# Patient Record
Sex: Male | Born: 1940 | Race: White | Hispanic: No | Marital: Married | Smoking: Never smoker
Health system: Southern US, Community
[De-identification: ages and names within clinical notes are randomized; demographics above are authoritative.]

## PROBLEM LIST (undated history)

## (undated) DIAGNOSIS — I63532 Cerebral infarction due to unspecified occlusion or stenosis of left posterior cerebral artery: Secondary | ICD-10-CM

## (undated) DIAGNOSIS — E785 Hyperlipidemia, unspecified: Secondary | ICD-10-CM

## (undated) DIAGNOSIS — Z86010 Personal history of colonic polyps: Secondary | ICD-10-CM

## (undated) DIAGNOSIS — I10 Essential (primary) hypertension: Secondary | ICD-10-CM

## (undated) DIAGNOSIS — R7302 Impaired glucose tolerance (oral): Secondary | ICD-10-CM

## (undated) DIAGNOSIS — D649 Anemia, unspecified: Secondary | ICD-10-CM

## (undated) DIAGNOSIS — H9319 Tinnitus, unspecified ear: Secondary | ICD-10-CM

## (undated) DIAGNOSIS — N400 Enlarged prostate without lower urinary tract symptoms: Secondary | ICD-10-CM

## (undated) DIAGNOSIS — R569 Unspecified convulsions: Secondary | ICD-10-CM

## (undated) DIAGNOSIS — K439 Ventral hernia without obstruction or gangrene: Secondary | ICD-10-CM

## (undated) DIAGNOSIS — A530 Latent syphilis, unspecified as early or late: Secondary | ICD-10-CM

## (undated) HISTORY — DX: Tinnitus, unspecified ear: H93.19

## (undated) HISTORY — DX: Latent syphilis, unspecified as early or late: A53.0

## (undated) HISTORY — DX: Personal history of colonic polyps: Z86.010

## (undated) HISTORY — DX: Hyperlipidemia, unspecified: E78.5

## (undated) HISTORY — DX: Impaired glucose tolerance (oral): R73.02

## (undated) HISTORY — DX: Ventral hernia without obstruction or gangrene: K43.9

## (undated) HISTORY — PX: OTHER SURGICAL HISTORY: SHX169

## (undated) HISTORY — DX: Anemia, unspecified: D64.9

## (undated) HISTORY — DX: Benign prostatic hyperplasia without lower urinary tract symptoms: N40.0

---

## 2008-09-08 ENCOUNTER — Emergency Department (HOSPITAL_COMMUNITY): Admission: EM | Admit: 2008-09-08 | Discharge: 2008-09-08 | Payer: Self-pay | Admitting: Emergency Medicine

## 2010-08-25 ENCOUNTER — Emergency Department (HOSPITAL_COMMUNITY)
Admission: EM | Admit: 2010-08-25 | Discharge: 2010-08-26 | Payer: Self-pay | Source: Home / Self Care | Admitting: Emergency Medicine

## 2010-11-24 LAB — DIFFERENTIAL
Basophils Absolute: 0 10*3/uL (ref 0.0–0.1)
Basophils Relative: 0 % (ref 0–1)
Eosinophils Absolute: 0 K/uL (ref 0.0–0.7)
Eosinophils Relative: 0 % (ref 0–5)
Lymphocytes Relative: 6 % — ABNORMAL LOW (ref 12–46)
Lymphs Abs: 0.5 K/uL — ABNORMAL LOW (ref 0.7–4.0)
Monocytes Absolute: 0.3 10*3/uL (ref 0.1–1.0)
Monocytes Relative: 4 % (ref 3–12)
Neutro Abs: 7.9 K/uL — ABNORMAL HIGH (ref 1.7–7.7)
Neutrophils Relative %: 90 % — ABNORMAL HIGH (ref 43–77)

## 2010-11-24 LAB — COMPREHENSIVE METABOLIC PANEL WITH GFR
ALT: 15 U/L (ref 0–53)
AST: 18 U/L (ref 0–37)
Albumin: 3.9 g/dL (ref 3.5–5.2)
Alkaline Phosphatase: 82 U/L (ref 39–117)
BUN: 16 mg/dL (ref 6–23)
GFR calc Af Amer: >60 mL/min (ref >60)
Potassium: 3.7 meq/L (ref 3.5–5.1)
Sodium: 138 meq/L (ref 135–145)
Total Protein: 7.3 g/dL (ref 6.0–8.3)

## 2010-11-24 LAB — CBC
HCT: 42.1 % (ref 39.0–52.0)
Hemoglobin: 13.6 g/dL (ref 13.0–17.0)
MCHC: 32.3 g/dL (ref 30.0–36.0)
MCV: 75.2 fL — ABNORMAL LOW (ref 78.0–100.0)
Platelets: 203 K/uL (ref 150–400)
RBC: 5.59 MIL/uL (ref 4.22–5.81)
RDW: 16.1 % — ABNORMAL HIGH (ref 11.5–15.5)
WBC: 8.7 10*3/uL (ref 4.0–10.5)

## 2010-11-24 LAB — COMPREHENSIVE METABOLIC PANEL
CO2: 23 mEq/L (ref 19–32)
Calcium: 9.2 mg/dL (ref 8.4–10.5)
Chloride: 107 mEq/L (ref 96–112)
Creatinine, Ser: 1.1 mg/dL (ref 0.4–1.5)
GFR calc non Af Amer: >60 mL/min (ref >60)
Glucose, Bld: 158 mg/dL — ABNORMAL HIGH (ref 70–99)
Total Bilirubin: 1 mg/dL (ref 0.3–1.2)

## 2010-11-24 LAB — URINALYSIS, ROUTINE W REFLEX MICROSCOPIC
Bilirubin Urine: NEGATIVE
Glucose, UA: NEGATIVE mg/dL
Hgb urine dipstick: NEGATIVE
Ketones, ur: NEGATIVE mg/dL
Nitrite: NEGATIVE
Protein, ur: NEGATIVE mg/dL
Specific Gravity, Urine: 1.023 (ref 1.005–1.030)
Urobilinogen, UA: 0.2 mg/dL (ref 0.0–1.0)
pH: 5 (ref 5.0–8.0)

## 2010-11-24 LAB — LIPASE, BLOOD: Lipase: 17 U/L (ref 11–59)

## 2011-06-30 ENCOUNTER — Encounter: Payer: Self-pay | Admitting: *Deleted

## 2011-06-30 ENCOUNTER — Emergency Department (INDEPENDENT_AMBULATORY_CARE_PROVIDER_SITE_OTHER)
Admission: EM | Admit: 2011-06-30 | Discharge: 2011-06-30 | Disposition: A | Discharge status: Home / Self Care | Payer: Medicare Other | Source: Home / Self Care | Attending: Emergency Medicine | Admitting: Emergency Medicine

## 2011-06-30 DIAGNOSIS — G5600 Carpal tunnel syndrome, unspecified upper limb: Secondary | ICD-10-CM

## 2011-06-30 DIAGNOSIS — G5601 Carpal tunnel syndrome, right upper limb: Secondary | ICD-10-CM

## 2011-06-30 HISTORY — DX: Essential (primary) hypertension: I10

## 2011-06-30 MED ORDER — TRAMADOL HCL 50 MG PO TABS: ORAL_TABLET | ORAL | Status: AC

## 2011-06-30 NOTE — ED Notes (Signed)
Right hand pain for  several years.  He tool tramadol before and got relief with that.  Denies  injury

## 2011-06-30 NOTE — ED Provider Notes (Signed)
History     CSN: [id number] Arrival date & time: 06/30/2011  2:37 PM   First MD Initiated Contact with Patient 06/30/11 1312      Chief Complaint  Patient presents with  . Hand Pain   HPI Comments: righthanded male who reports several years of intermittent right hand "timgling' lasting several minutes and resolving. Tends to wake him up at night when he has his wrist flexed, and with use. No weakness, h/o recent, remote trauma, color changes, fevers, deformity. Taking ultram prn for this rx'd to him by PMD Dr. Erling Conte with relief. States has run out of tramadol.   Patient is a 70 y.o. male presenting with hand pain. The history is provided by the patient. No language interpreter was used.  Hand Pain This is a chronic problem. The problem has been gradually worsening. Exacerbated by: flexgin wrist, use of hand. Relieved by: tramadiol.    Past Medical History  Diagnosis Date  . Hypertension     History reviewed. No pertinent past surgical history.  Family History  Problem Relation Age of Onset  . Cancer Mother   . COPD Father     History  Substance Use Topics  . Smoking status: Never Smoker   . Smokeless tobacco: Never Used  . Alcohol Use: No      Review of Systems  Constitutional: Negative for fever.  Gastrointestinal: Negative for nausea and vomiting.  Musculoskeletal: Negative for joint swelling.  Skin: Negative for color change, pallor and rash.  Neurological: Negative for weakness and numbness.    Allergies  Review of patient's allergies indicates no known allergies.  Home Medications   Current Outpatient Rx  Name Route Sig Dispense Refill  . ASPIRIN 81 MG PO TABS Oral Take 81 mg by mouth daily.      . TRAMADOL HCL 50 MG PO TABS  1-2 tabs po q 6 hr prn pain Maximum dose= 8 tablets per day 20 tablet 0    BP 165/86  Pulse 56  Temp(Src) 98 F (36.7 C) (Oral)  Resp 16  SpO2 100%  Physical Exam  Nursing note and vitals reviewed. Constitutional: He is  oriented to person, place, and time. He appears well-developed and well-nourished.  HENT:  Head: Normocephalic and atraumatic.  Eyes: Conjunctivae and EOM are normal. Pupils are equal, round, and reactive to light.  Neck: Normal range of motion.  Cardiovascular: Regular rhythm.   Pulmonary/Chest: Effort normal. No respiratory distress.  Abdominal: He exhibits no distension.  Musculoskeletal: Normal range of motion.       Baseline Strength and Sensation to R hand with normal light touch intact for Pt, distal motor and sensation in median/radial/ulnar nerve distribution with CR< 2 secs and pulse intact.  Hand with intact motor strength 5/5 flexion / extension / FDP  / FDS in all fingers against resistance and 2-point discrimination intact at 5mm in all fingers. Skin intact. No signs of trauma. Wrist WNL. Tinel's, phalen's (+)   Neurological: He is alert and oriented to person, place, and time.  Skin: Skin is warm and dry.  Psychiatric: He has a normal mood and affect. His behavior is normal.    ED Course  Procedures (including critical care time)  Labs Reviewed - No data to display No results found.   1. Right carpal tunnel syndrome       MDM  H&P most c/w carpal tunnel syndorm with (+) phalanes, tinel's. Has FROM, equal strength b/l. NVI. Placing in splint, nsaids, will refer to  hand Dr. Amanda Pea if no improvement in 10 days.          Luiz Blare, MD 06/30/11 [phone]

## 2013-01-29 ENCOUNTER — Ambulatory Visit: Payer: Medicare Other

## 2013-01-29 ENCOUNTER — Ambulatory Visit (INDEPENDENT_AMBULATORY_CARE_PROVIDER_SITE_OTHER): Payer: Medicare Other | Admitting: Family Medicine

## 2013-01-29 VITALS — BP 206/98 | HR 78 | Temp 97.6°F | Resp 20 | Ht 71.0 in | Wt 239.6 lb

## 2013-01-29 DIAGNOSIS — I10 Essential (primary) hypertension: Secondary | ICD-10-CM

## 2013-01-29 DIAGNOSIS — R600 Localized edema: Secondary | ICD-10-CM

## 2013-01-29 DIAGNOSIS — R209 Unspecified disturbances of skin sensation: Secondary | ICD-10-CM

## 2013-01-29 DIAGNOSIS — D649 Anemia, unspecified: Secondary | ICD-10-CM

## 2013-01-29 DIAGNOSIS — H9319 Tinnitus, unspecified ear: Secondary | ICD-10-CM

## 2013-01-29 DIAGNOSIS — R109 Unspecified abdominal pain: Secondary | ICD-10-CM

## 2013-01-29 DIAGNOSIS — H9311 Tinnitus, right ear: Secondary | ICD-10-CM

## 2013-01-29 DIAGNOSIS — R202 Paresthesia of skin: Secondary | ICD-10-CM

## 2013-01-29 DIAGNOSIS — H6121 Impacted cerumen, right ear: Secondary | ICD-10-CM

## 2013-01-29 DIAGNOSIS — H6120 Impacted cerumen, unspecified ear: Secondary | ICD-10-CM

## 2013-01-29 DIAGNOSIS — R351 Nocturia: Secondary | ICD-10-CM

## 2013-01-29 DIAGNOSIS — K439 Ventral hernia without obstruction or gangrene: Secondary | ICD-10-CM

## 2013-01-29 LAB — POCT CBC
Granulocyte percent: 71.3 %G (ref 37–80)
HCT, POC: 40.4 % — AB (ref 43.5–53.7)
Hemoglobin: 12.4 g/dL — AB (ref 14.1–18.1)
Lymph, poc: 2.1 (ref 0.6–3.4)
MCV: 77.8 fL — AB (ref 80–97)
POC Granulocyte: 6.6 (ref 2–6.9)
POC LYMPH PERCENT: 23 %L (ref 10–50)
RDW, POC: 16.7 %

## 2013-01-29 LAB — POCT URINALYSIS DIPSTICK
Bilirubin, UA: NEGATIVE
Blood, UA: NEGATIVE
Glucose, UA: NEGATIVE
Ketones, UA: NEGATIVE
Nitrite, UA: NEGATIVE
Spec Grav, UA: 1.025
pH, UA: 5.5

## 2013-01-29 LAB — POCT UA - MICROSCOPIC ONLY
Epithelial cells, urine per micros: NEGATIVE
Mucus, UA: POSITIVE
Yeast, UA: NEGATIVE

## 2013-01-29 LAB — GLUCOSE, POCT (MANUAL RESULT ENTRY): POC Glucose: 100 mg/dl — AB (ref 70–99)

## 2013-01-29 MED ORDER — HYDROCHLOROTHIAZIDE 12.5 MG PO TABS: ORAL_TABLET | ORAL | Status: DC

## 2013-01-29 NOTE — Progress Notes (Signed)
Subjective:    Patient ID: Mark Soto, male    DOB: [birth year], 72 y.o.   MRN: [id number]  HPI Patient presents with multiple problems & reports not seeing a physician in >10 years.   Right ear is described as a ringing sound, no pain, but difficulty hearing on the right side.    Numbness/tingling in right hand is reported as non painful, constant, "pins and needles" sensation, no weakness.    Swelling in leg in worse when he is on his feet during the day, pain is from knee to foot bilaterally, occasionally swells into thigh.  Describes some mild abdominal pain following larger meals, no nausea/vomiting.   Reports nocturia 6-7 x per night, frequency during the day as well, no dysuria.   Occasional sharp stabbing chest pain, lasts for a few seconds and dissipates. No palpations.   Reports checking his BP at local pharmacy automatic machine with a reading of 180/90 and assuming it was incorrect 1 year ago.  Review of Systems Denies: dizziness, headaches, vision changes, SOB, urinary frequency/burning. Admits to occasional blurry vision.     Objective:   Physical Exam  BP 206/98  Pulse 78  Temp(Src) 97.6 F (36.4 C) (Oral)  Resp 20  Ht 5\' 11"  (1.803 m)  Wt 239 lb 9.6 oz (108.682 kg)  BMI 33.43 kg/m2  SpO2 95%  General: WDWN male, appears stated age, NAD, accompanied by 2 sisters.  Head: normocephalic, atraumatic  Eyes: PERLA, sclera/conjunctiva clear  Ears: Right ear cerumen impaction, left ear TM clear with visible bony landmarks. Right ear cerumen impaction was irrigated, TM visible with bony landmarks post irrigation, patient reports tinnitus & hearing difficulty resolved immediately.  Nose: turbinates normal, no visible discharge   Throat: moist mucous membranes, upper denture plate, lower partial plate, uvula midline, posterior pharynx without erythema edema or exudate.   Neck: supple, no JVD, no palpable lymphadenopathy  Resp: clear to ausculation  anterior/posterior fields bilaterally, without rales rhonchi wheezes Cardiac: RRR, no mumurs rubs gallops, no carotid bruits, radial pulses present & even bilaterally, normal capillary refill UE & LE Abdomen: soft, non distended, non tender to palpation, upper abdominal wall midline hernia, normal bowel sounds, no Murphy's sign Extremities: no obvious deformity, sensation intact UE & LE, full ROM UE & LE, 5/5 strength UE & LE, pitting edema in lower extremity 2+ from foot to knee Neuro: alert & oriented x 3, CN II-XII grossly intact  Skin: no rashes lesions jaundice   Results for orders placed in visit on 01/29/13  POCT CBC      Result Value Range   WBC 9.2  4.6 - 10.2 K/uL   Lymph, poc 2.1  0.6 - 3.4   POC LYMPH PERCENT 23.0  10 - 50 %L   MID (cbc) 0.5  0 - 0.9   POC MID % 5.7  0 - 12 %M   POC Granulocyte 6.6  2 - 6.9   Granulocyte percent 71.3  37 - 80 %G   RBC 5.19  4.69 - 6.13 M/uL   Hemoglobin 12.4 (*) 14.1 - 18.1 g/dL   HCT, POC 40.9 (*) 81.1 - 53.7 %   MCV 77.8 (*) 80 - 97 fL   MCH, POC 23.9 (*) 27 - 31.2 pg   MCHC 30.7 (*) 31.8 - 35.4 g/dL   RDW, POC 91.4     Platelet Count, POC 205  142 - 424 K/uL   MPV 11.8  0 - 99.8 fL  GLUCOSE, POCT (MANUAL RESULT  ENTRY)      Result Value Range   POC Glucose 100 (*) 70 - 99 mg/dl  POCT UA - MICROSCOPIC ONLY      Result Value Range   WBC, Ur, HPF, POC 0-1     RBC, urine, microscopic 2-3     Bacteria, U Microscopic trace     Mucus, UA pos     Epithelial cells, urine per micros neg     Crystals, Ur, HPF, POC neg     Casts, Ur, LPF, POC neg     Yeast, UA neg    POCT URINALYSIS DIPSTICK      Result Value Range   Color, UA yellow     Clarity, UA clear     Glucose, UA neg     Bilirubin, UA neg     Ketones, UA neg     Spec Grav, UA 1.025     Blood, UA neg     pH, UA 5.5     Protein, UA neg     Urobilinogen, UA 0.2     Nitrite, UA neg     Leukocytes, UA Negative      Chest Xray read by: Dr. Katrinka Blazing: normal.  No evidence of  CHF.     Assessment & Plan:  Tinnitus of right ear/Cerumen impaction, right - resolved s/p irrigation  Paresthesia - RIGHT face/neck and hand - no evidence of acute cerebrovascular event  Lower leg edema - Plan: Comprehensive metabolic panel, TSH  HTN (hypertension) - Plan: POCT CBC, Comprehensive metabolic panel, TSH, DG Chest 2 View, EKG 12-Lead  Abdominal pain - related to meals, possible GERD  Nocturia - Plan: POCT glucose (manual entry), POCT UA - Microscopic Only, POCT urinalysis dipstick  Abdominal wall hernia - asymptomatic.  Observation.  Anemia - Plan: Ambulatory referral to Gastroenterology   Suspect HTN is causing edema, paresthesia. Other possible etiologies include CHF, diabetes but are less likely due to chest xray and glucose reading.  Begin HCTZ.  Schedule colonoscopy with Dr. Juanda Chance.  Follow up in 3-4 weeks.

## 2013-01-29 NOTE — Progress Notes (Signed)
I have examined this patient along with the student and agree.  

## 2013-01-30 LAB — COMPREHENSIVE METABOLIC PANEL
AST: 15 U/L (ref 0–37)
Albumin: 4.2 g/dL (ref 3.5–5.2)
BUN: 11 mg/dL (ref 6–23)
CO2: 28 mEq/L (ref 19–32)
Calcium: 9.6 mg/dL (ref 8.4–10.5)
Chloride: 102 mEq/L (ref 96–112)
Creat: 0.95 mg/dL (ref 0.50–1.35)
Glucose, Bld: 101 mg/dL — ABNORMAL HIGH (ref 70–99)
Potassium: 4.4 mEq/L (ref 3.5–5.3)

## 2013-01-30 LAB — TSH: TSH: 2.616 u[IU]/mL (ref 0.350–4.500)

## 2013-01-31 ENCOUNTER — Encounter: Payer: Self-pay | Admitting: Physician Assistant

## 2013-02-02 ENCOUNTER — Encounter: Payer: Self-pay | Admitting: Internal Medicine

## 2013-02-06 ENCOUNTER — Ambulatory Visit: Payer: 59 | Admitting: Internal Medicine

## 2013-02-06 ENCOUNTER — Telehealth: Payer: Self-pay | Admitting: Internal Medicine

## 2013-02-06 NOTE — Telephone Encounter (Signed)
pt r/s appt because of death in the family. Will you charge?

## 2013-02-06 NOTE — Telephone Encounter (Signed)
No charge per Dr. Gessner. 

## 2013-02-06 NOTE — Telephone Encounter (Signed)
No charge. 

## 2013-03-07 NOTE — Progress Notes (Signed)
History and physical exam reviewed in detail with Rudolpho Sevin, PA-student and Porfirio Oar, PA-C.  Patient interviewed and examined by myself as well. Agree with current assessment and plan.

## 2013-03-09 ENCOUNTER — Encounter: Payer: Self-pay | Admitting: Internal Medicine

## 2013-03-09 ENCOUNTER — Ambulatory Visit (INDEPENDENT_AMBULATORY_CARE_PROVIDER_SITE_OTHER): Payer: 59 | Admitting: Internal Medicine

## 2013-03-09 VITALS — BP 150/70 | HR 64 | Ht 71.0 in | Wt 238.4 lb

## 2013-03-09 DIAGNOSIS — D649 Anemia, unspecified: Secondary | ICD-10-CM

## 2013-03-09 DIAGNOSIS — Z1211 Encounter for screening for malignant neoplasm of colon: Secondary | ICD-10-CM

## 2013-03-09 MED ORDER — SOD PICOSULFATE-MAG OX-CIT ACD 10-3.5-12 MG-GM-GM PO PACK: 1.0000 | PACK | Freq: Once | ORAL | Status: DC

## 2013-03-09 NOTE — Progress Notes (Signed)
Subjective:  Mark Chick, MD - referring   Patient ID: Mark Soto, male    DOB: 09-10-1940, 72 y.o.   MRN: [id number]  HPI This very nice elderly man is here because he has never had a colonoscopy. He also has a mild anemia with chronic mild microcytosis. He has occasional abdominal pressure but GI ROS otherwise negative.  No Known Allergies Outpatient Prescriptions Prior to Visit  Medication Sig Dispense Refill  . aspirin 81 MG tablet Take 81 mg by mouth daily.        . hydrochlorothiazide (HYDRODIURIL) 12.5 MG tablet 1 tablet daily x 2 weeks and then 2 tablets daily  60 tablet  5   No facility-administered medications prior to visit.   Past Medical History  Diagnosis Date  . Hypertension   . Tinnitus     Right Ear  . Anemia   . Abdominal wall hernia    Past Surgical History  Procedure Laterality Date  . None     History   Social History  . Marital Status: Married    Spouse Name: N/A    Number of Children: N/A  . Years of Education: N/A   Social History Main Topics  . Smoking status: Never Smoker   . Smokeless tobacco: Never Used  . Alcohol Use: No  . Drug Use: No    Social History Narrative   Lives with wife at home and 37 year old son. Son has special needs, sleep apnea, & severe seizure disorder. Wife has "many healthy problems". Another son in [place]. Daughter in [place].       3 sisters live in [place]. 1 brother in [place].       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 Breast cancer Mother   . COPD Father   . Hypertension Sister   . Hypertension Brother   . Hypertension Sister   . Liver cancer Mother   . Lung cancer Mother   . Colon cancer Neg Hx   . Colon polyps Neg Hx   . Stomach cancer Neg Hx        Review of Systems + fatigue and mild pedal edema, all other ROS negative or as per HPI    Objective:   Physical Exam General:   Well-developed, well-nourished and in no acute distress Eyes:  anicteric. ENT:   Mouth and posterior pharynx free of lesions. + dentures Neck:   supple w/o thyromegaly or mass.  Lungs: Clear to auscultation bilaterally. Heart:  S1S2, no rubs, murmurs, gallops. Abdomen:  soft, non-tender, no hepatosplenomegaly, hernia, or mass and BS+.  Rectal: deferred Lymph:  no cervical or supraclavicular adenopathy. Extremities:   no edema Skin   no rash. Neuro:  A&O x 3.  Psych:  appropriate mood and  Affect.   Data Reviewed: Lab Results  Component Value Date   WBC 9.2 01/29/2013   HGB 12.4* 01/29/2013   HCT 40.4* 01/29/2013   MCV 77.8* 01/29/2013   PLT 203 09/08/2008      Assessment & Plan:  Anemia - Plan: Ferritin, CBC with Differential, Iron Binding Cap (TIBC), Vitamin B12  Special screening for malignant neoplasms, colon - Plan: Ambulatory referral to Gastroenterology  1. Assess anemia with CBC, iron studies and B12 - ? If he may have a thalassemia or similar issue as was microcytic in past 2. Schedule colonoscopy for screening - The  risks and benefits as well as alternatives of endoscopic procedure(s) have been discussed and reviewed. All questions answered. The patient agrees to proceed.   I appreciate the opportunity to care for this patient.  .ZO:[email or web address], MD

## 2013-03-09 NOTE — Progress Notes (Signed)
F/u appt made with pt for 03/22/13.

## 2013-03-09 NOTE — Patient Instructions (Addendum)
You have been scheduled for a colonoscopy with propofol. Please follow written instructions given to you at your visit today.  Please use the prepopik sample we gave you today. If you use inhalers (even only as needed), please bring them with you on the day of your procedure. Your physician has requested that you go to www.startemmi.com and enter the access code given to you at your visit today. This web site gives a general overview about your procedure. However, you should still follow specific instructions given to you by our office regarding your preparation for the procedure.  Your physician has requested that you go to the basement for the following lab work before leaving today: CBC/diff, TIBC, Ferritin, Vitamin B12 level  I appreciate the opportunity to care for you.

## 2013-03-22 ENCOUNTER — Ambulatory Visit (INDEPENDENT_AMBULATORY_CARE_PROVIDER_SITE_OTHER): Payer: Medicare Other | Admitting: Family Medicine

## 2013-03-22 ENCOUNTER — Encounter: Payer: Self-pay | Admitting: Family Medicine

## 2013-03-22 VITALS — BP 162/82 | HR 77 | Temp 97.8°F | Resp 16 | Ht 70.5 in | Wt 239.0 lb

## 2013-03-22 DIAGNOSIS — R202 Paresthesia of skin: Secondary | ICD-10-CM

## 2013-03-22 DIAGNOSIS — I10 Essential (primary) hypertension: Secondary | ICD-10-CM

## 2013-03-22 DIAGNOSIS — M7021 Olecranon bursitis, right elbow: Secondary | ICD-10-CM

## 2013-03-22 DIAGNOSIS — D649 Anemia, unspecified: Secondary | ICD-10-CM

## 2013-03-22 DIAGNOSIS — M25429 Effusion, unspecified elbow: Secondary | ICD-10-CM

## 2013-03-22 DIAGNOSIS — M25421 Effusion, right elbow: Secondary | ICD-10-CM

## 2013-03-22 DIAGNOSIS — M7020 Olecranon bursitis, unspecified elbow: Secondary | ICD-10-CM

## 2013-03-22 DIAGNOSIS — R209 Unspecified disturbances of skin sensation: Secondary | ICD-10-CM

## 2013-03-22 LAB — BASIC METABOLIC PANEL
CO2: 27 mEq/L (ref 19–32)
Calcium: 9.3 mg/dL (ref 8.4–10.5)
Potassium: 4.1 mEq/L (ref 3.5–5.3)
Sodium: 140 mEq/L (ref 135–145)

## 2013-03-22 LAB — IRON AND TIBC
%SAT: 16 % — ABNORMAL LOW (ref 20–55)
TIBC: 335 ug/dL (ref 215–435)
UIBC: 282 ug/dL (ref 125–400)

## 2013-03-22 MED ORDER — HYDROCHLOROTHIAZIDE 25 MG PO TABS: 25.0000 mg | ORAL_TABLET | Freq: Every day | ORAL | Status: DC

## 2013-03-22 MED ORDER — AMLODIPINE BESYLATE 2.5 MG PO TABS: 2.5000 mg | ORAL_TABLET | Freq: Every day | ORAL | Status: DC

## 2013-03-22 NOTE — Patient Instructions (Signed)
Olecranon Bursitis  °with Rehab °A bursa is a fluid filled sac that is located between soft tissues (ligaments, tendons, skin) and bones. The purpose of a bursa is to allow the soft tissue to function smoothly, without friction. The olecrenon bursa is located between the back of the elbow (olecrenon) and the skin. Olecrenon bursitis involves inflammation of this bursa, resulting in pain. °SYMPTOMS  °· Pain, tenderness, swelling, warmth, or redness over the back of the elbow. °· Reduced range of motion of the affected elbow. °· Sometimes, severe pain with movement of the affected elbow. °· Crackling sound (crepitation) when the bursa is moved or touched. °· Often, painless swelling of the bursa. °· Fever (when infected). °CAUSES  °Olecranon bursitis is often caused by direct hit (trauma) to the elbow. Less commonly, it is due to overuse and/or strenuous exercise that the elbow is not used to. °RISK INCREASES WITH: °· Sports that require bending or landing on the elbow (football, volleyball). °· Vigorous or repetitive athletic training, or sudden increase or change in activity level (weekend warriors). °· Failure to warm up properly before activity. °· Poor exercise technique. °· Playing on artificial turf. °PREVENTION °· Avoid injuries and the overuse of muscles whenever possible. °· Warm up and stretch properly before activity. °· Allow for adequate recovery between workouts. °· Maintain physical fitness: °· Strength, flexibility, and endurance. °· Cardiovascular fitness. °· Learn and use proper technique. °· Wear properly fitted and padded protective equipment. °PROGNOSIS  °If treated properly, olecranon bursitis is usually curable within 2 weeks.  °RELATED COMPLICATIONS  °· Longer healing time, if not properly treated or if not given enough time to heal. °· Recurring symptoms that result in a chronic problem. °· Joint stiffness with permanent limitation of the affected joint's movement. °· Infection of the  bursa. °· Chronic inflammation or scarring of the bursa. °TREATMENT °Treatment first involves the use of ice and medicine, to reduce pain and inflammation. The use of strengthening and stretching exercises may help reduce pain with activity. These exercises may be performed at home or with a therapist. Elbow pads may be advised, to protect the bursa. If symptoms persist, despite non-surgical treatment, a procedure to withdraw fluid from the bursa may be advised. This procedure may be accompanied with an injection of corticosteroids, to reduce inflammation. Sometimes, surgery is needed to remove the bursa. °MEDICATION °· If pain medicine is needed, nonsteroidal anti-inflammatory medicines (aspirin and ibuprofen), or other minor pain relievers (acetaminophen), are often advised. °· Do not take pain medicine for 7 days before surgery. °· Prescription pain relievers may be given, if your caregiver thinks they are needed. Use only as directed and only as much as you need. °· Corticosteroid injections may be given by your caregiver. These injections should be reserved for the most serious cases, because they may only be given a certain number of times. °HEAT AND COLD °· Cold treatment (icing) should be applied for 10 to 15 minutes every 2 to 3 hours for inflammation and pain, and immediately after activity that aggravates your symptoms. Use ice packs or an ice massage. °· Heat treatment may be used before performing stretching and strengthening activities prescribed by your caregiver, physical therapist, or athletic trainer. Use a heat pack or a warm water soak. °SEEK IMMEDIATE MEDICAL CARE IF:  °· Symptoms get worse or do not improve in 2 weeks, despite treatment. °· Signs of infection develop, including fever of 102° F (38.9° C), increased pain, redness, warmth, or pus draining from   the bursa. °· New, unexplained symptoms develop. (Drugs used in treatment may produce side effects.) °EXERCISES  °RANGE OF MOTION (ROM) AND  STRETCHING EXERCISES - Olecranon Bursitis °These exercises may help you when beginning to rehabilitate your injury. Your symptoms may resolve with or without further involvement from your physician, physical therapist or athletic trainer. While completing these exercises, remember:  °· Restoring tissue flexibility helps normal motion to return to the joints. This allows healthier, less painful movement and activity. °· An effective stretch should be held for at least 30 seconds. °· A stretch should never be painful. You should only feel a gentle lengthening or release in the stretched tissue. °RANGE OF MOTION  Elbow Flexion, Supine °· Lie on your back. Extend your right / left arm into the air, bracing it with your opposite hand. Allow your right / left arm to relax. °· Let your elbow bend, allowing your hand to fall slowly toward your chest. °· You should feel a gentle stretch along the back of your upper arm and elbow. Your physician, physical therapist or athletic trainer may ask you to hold a __________ hand weight to increase the intensity of this stretch. °· Hold for __________ seconds. Slowly return your right / left arm to the upright position. °Repeat __________ times. Complete this exercise __________ times per day. °STRETCH  Elbow Flexors °· Lie on a firm bed or countertop on your back. Be sure that you are in a comfortable position which will allow you to relax your arm muscles. °· Place a folded towel under your right / left upper arm, so that your elbow and shoulder are at the same height. Extend your arm; your elbow should not rest on the bed or towel °· Allow the weight of your hand to straighten your elbow. Keep your arm and chest muscles relaxed. Your caregiver may ask you to increase the intensity of your stretch by adding a small wrist or hand weight. °· Hold for __________ seconds. You should feel a stretch on the inside of your elbow. Slowly return to the starting position. °Repeat __________  times. Complete this exercise __________ times per day. °STRENGTHENING EXERCISES - Olecranon Bursitis °These exercises will help you regain your strength. They may resolve your symptoms with or without further involvement from your physician, physical therapist or athletic trainer. While completing these exercises, remember:  °· Muscles can gain both the endurance and the strength needed for everyday activities through controlled exercises. °· Complete these exercises as instructed by your physician, physical therapist or athletic trainer. Increase the resistance and repetitions only as guided by your caregiver. °· You may experience muscle soreness or fatigue, but the pain or discomfort you are trying to eliminate should never worsen during these exercises. If this pain does worsen, stop and make certain you are following the directions exactly. If the pain is still present after adjustments, discontinue the exercise until you can discuss the trouble with your caregiver. °STRENGTH - Elbow Extensors, Isometric °· Stand or sit upright on a firm surface. Place your right / left arm so that your palm faces your stomach, and it is at the height of your waist. °· Place your opposite hand on the underside of your forearm. Gently push up as your right / left arm resists. Push as hard as you can with both arms without causing any pain or movement at your right / left elbow. Hold this stationary position for __________ seconds. °· Gradually release the tension in both arms. Allow   your muscles to relax completely before repeating. °Repeat __________ times. Complete this exercise __________ times per day. °STRENGTH - Elbow Flexors, Isometric °· Stand or sit upright on a firm surface. Place your right / left arm so that your hand is palm-up and at the height of your waist. °· Place your opposite hand on top of your forearm. Gently push down as your right / left arm resists. Push as hard as you can with both arms without causing  any pain or movement at your right / left elbow. Hold this stationary position for __________ seconds. °· Gradually release the tension in both arms. Allow your muscles to relax completely before repeating. °Repeat __________ times. Complete this exercise __________ times per day. °STRENGTH  Elbow Flexors, Supinated °· With good posture, stand or sit on a firm chair without armrests. Allow your right / left arm to rest at your side with your palm facing forward. °· Holding a __________ weight, or gripping a rubber exercise band or tubing, °· bring your hand toward your shoulder. °· Allow your muscles to control the resistance as your hand returns to your side. °Repeat __________ times. Complete this exercise __________ times per day.  °STRENGTH  Elbow Flexors, Neutral °· With good posture, stand or sit on a firm chair without armrests. Allow your right / left arm to rest at your side with your thumb facing forward. °· Holding a __________weight, or gripping a rubber exercise band or tubing, °· bring your hand toward your shoulder. °· Allow your muscles to control the resistance as your hand returns to your side. °Repeat __________ times. Complete this exercise __________ times per day.  °STRENGTH  Elbow Extensors °· Lie on your back. Extend your right / left elbow into the air, pointing it toward the ceiling. Brace your arm with your opposite hand.* °· Holding a __________ weight in your hand, slowly straighten your right / left elbow. °· Allow your muscles to control the weight as your hand returns to its starting position. °Repeat __________ times. Complete this exercise __________ times per day. °*You may also stand with your elbow overhead and pointed toward the ceiling, supported by your opposite hand. °STRENGTH - Elbow Extensors, Dynamic °· With good posture, stand, or sit on a firm chair without armrests. Keeping your upper arms at your side, bring both hands up to your right / left shoulder while gripping a  rubber exercise band or tubing. Your right / left hand should be just below the other hand. °· Straighten your right / left elbow. Hold for __________ seconds. °· Allow your muscles to control the rubber exercise band, as your hand returns to your shoulder. °Repeat __________ times. Complete this exercise __________ times per day. °Document Released: 07/27/2005 Document Revised: 10/19/2011 Document Reviewed: 11/08/2008 °[employer]® Patient Information ©2014 [employer]. ° °

## 2013-03-22 NOTE — Progress Notes (Signed)
[address]   [place], [place]  [address]   [phone]  Subjective:    Patient ID: Mark Soto, male    DOB: Jan 06, 1941, 72 y.o.   MRN: [id number]  HPI This 72 y.o. male presents for one month follow-up of the following:  1. HTN: one month follow-up; changes made at last visit includes starting HCTZ 12.5mg  one tablet daily for two weeks and then two tablets daily; no side effects to medication; good compliance with medication; good symptom control; home BP readings are 150-180/70-75.  Needs refill on medication.  Nocturia x 4.  No decreased urination; no straining to get urine out; does not have to sit to urinate.  2.  Anemia:  Scheduled for colonoscopy by Leone Payor on 04/07/2013.  Presenting for repeat labs.  Denies n/v/d/c; denies bloody stools or melena; no frequent GERD, heartburn, indigestion.  No abdominal pain.  3.  Leg swelling:  Improved significantly from last visit.  Still mild swelling persistent.  4. Sluggish:  When driving and sitting down.    5.  L hand numbness: intermittent; occurs once per week.  [place] a few seconds.  No associated neck pain.  No weakness.  6.  R elbow swelling: hit elbow on chair a few weeks ago with subsequent swelling at bursa.  Full range of motion; no redness; no pain.     Review of Systems  Constitutional: Negative for fever, chills, diaphoresis and fatigue.  Respiratory: Negative for shortness of breath, wheezing and stridor.   Cardiovascular: Negative for chest pain, palpitations and leg swelling.  Gastrointestinal: Negative for nausea, vomiting, abdominal pain, diarrhea, constipation, blood in stool and abdominal distention.  Genitourinary: Negative for difficulty urinating.  Musculoskeletal: Positive for joint swelling. Negative for myalgias and arthralgias.  Skin: Negative for rash and wound.  Neurological: Negative for dizziness, tremors, seizures, syncope, facial asymmetry, speech difficulty, weakness, light-headedness, numbness  and headaches.    Past Medical History  Diagnosis Date  . Hypertension   . Tinnitus     Right Ear  . Anemia   . Abdominal wall hernia     Past Surgical History  Procedure Laterality Date  . None      Prior to Admission medications   Medication Sig Start Date End Date Taking? Authorizing Provider  aspirin 81 MG tablet Take 81 mg by mouth daily.     Yes Historical Provider, MD  hydrochlorothiazide (HYDRODIURIL) 25 MG tablet Take 1 tablet (25 mg total) by mouth daily. 03/22/13  Yes [name], MD  amLODipine (NORVASC) 2.5 MG tablet Take 1 tablet (2.5 mg total) by mouth daily. 03/22/13   [name], MD  Sod Picosulfate-Mag Ox-Cit Acd (PREPOPIK) 10-3.5-12 MG-GM-GM PACK Take 1 kit by mouth once. 03/09/13   Iva Boop, MD    No Known Allergies  History   Social History  . Marital Status: Married    Spouse Name: N/A    Number of Children: N/A  . Years of Education: N/A   Occupational History  . Not on file.   Social History Main Topics  . Smoking status: Never Smoker   . Smokeless tobacco: Never Used  . Alcohol Use: No  . Drug Use: No  . Sexual Activity: Not on file   Other Topics Concern  . Not on file   Social History Narrative   Lives with wife at home and 66 year old son. Son has special needs, sleep apnea, & severe seizure disorder. Wife has "many healthy problems". Another  son in [place]. Daughter in [place].       3 sisters live in [place]. 1 brother in [place].       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 Breast cancer Mother   . COPD Father   . Hypertension Sister   . Hypertension Brother   . Hypertension Sister   . Liver cancer Mother   . Lung cancer Mother   . Colon cancer Neg Hx   . Colon polyps Neg Hx   . Stomach cancer Neg Hx        Objective:   Physical Exam  Nursing note and vitals reviewed. Constitutional: He is oriented to person,  place, and time. He appears well-developed and well-nourished. No distress.  HENT:  Head: Normocephalic and atraumatic.  Right Ear: External ear normal.  Left Ear: External ear normal.  Nose: Nose normal.  Mouth/Throat: Oropharynx is clear and moist.  Eyes: Conjunctivae and EOM are normal. Pupils are equal, round, and reactive to light.  Neck: Normal range of motion. Neck supple. No thyromegaly present.  Cardiovascular: Normal rate, regular rhythm, normal heart sounds and intact distal pulses.  Exam reveals no gallop and no friction rub.   No murmur heard. Pulmonary/Chest: Effort normal and breath sounds normal. He has no wheezes. He has no rales.  Abdominal: Soft. Bowel sounds are normal. He exhibits no distension and no mass. There is no tenderness. There is no rebound and no guarding.  Musculoskeletal:       Right elbow: He exhibits swelling. He exhibits normal range of motion, no effusion, no deformity and no laceration. No radial head, no medial epicondyle, no lateral epicondyle and no olecranon process tenderness noted.       Left wrist: Normal.       Left hand: Normal.  R ELBOW:  FULL EXTENSION, FLEXION; NORMAL SUPINATION, PRONATION; SWELLING AT OLECRANON BURSA.  NON-TENDER; NO WARMTH.  Lymphadenopathy:    He has no cervical adenopathy.  Neurological: He is alert and oriented to person, place, and time.  Skin: Skin is warm and dry. No rash noted. He is not diaphoretic.  Psychiatric: He has a normal mood and affect. His behavior is normal.       Assessment & Plan:  Anemia, unspecified - Plan: Ferritin, Iron and TIBC  Essential hypertension, benign - Plan: CBC with Differential, Basic metabolic panel, Ferritin, Iron and TIBC  Paresthesias in left hand  Elbow swelling, right  Olecranon bursitis, right   1.  Anemia: stable; repeat labs; scheduled for colonoscopy on 04/06/13.  Obtain iron studies. 2.  HTN:  Improved but still elevated; continue HCTZ 25mg  daily; add Amlodipine  2.5mg  one daily.  Obtain labs. 3.  Paresthesias L hand:  Persistent; occurs once per week for a few seconds; reassurance provided.  4.  R elbow swelling:  New. Secondary to olecranon bursitis. Reassurance. 5. R Olecranon Bursitis:  New.  Recommend rest, ice, compression; avoid re-injury; no indication for drainage due to lack of symptoms.  Meds ordered this encounter  Medications  . hydrochlorothiazide (HYDRODIURIL) 25 MG tablet    Sig: Take 1 tablet (25 mg total) by mouth daily.    Dispense:  30 tablet    Refill:  5  . amLODipine (NORVASC) 2.5 MG tablet    Sig: Take 1 tablet (2.5 mg total) by mouth daily.    Dispense:  30 tablet  Refill:  5

## 2013-03-23 LAB — CBC WITH DIFFERENTIAL/PLATELET
Basophils Relative: 0 % (ref 0–1)
Eosinophils Absolute: 0.1 10*3/uL (ref 0.0–0.7)
Hemoglobin: 11.8 g/dL — ABNORMAL LOW (ref 13.0–17.0)
Lymphs Abs: 1.8 10*3/uL (ref 0.7–4.0)
MCH: 23.5 pg — ABNORMAL LOW (ref 26.0–34.0)
Neutro Abs: 5.3 10*3/uL (ref 1.7–7.7)
Neutrophils Relative %: 66 % (ref 43–77)
Platelets: 216 10*3/uL (ref 150–400)
RBC: 5.03 MIL/uL (ref 4.22–5.81)

## 2013-03-23 LAB — FERRITIN: Ferritin: 111 ng/mL (ref 22–322)

## 2013-04-06 ENCOUNTER — Encounter: Payer: Self-pay | Admitting: Internal Medicine

## 2013-04-06 ENCOUNTER — Ambulatory Visit (AMBULATORY_SURGERY_CENTER): Payer: 59 | Admitting: Internal Medicine

## 2013-04-06 VITALS — BP 146/82 | HR 73 | Temp 96.9°F | Resp 30 | Ht 71.0 in | Wt 238.0 lb

## 2013-04-06 DIAGNOSIS — Z1211 Encounter for screening for malignant neoplasm of colon: Secondary | ICD-10-CM

## 2013-04-06 DIAGNOSIS — D126 Benign neoplasm of colon, unspecified: Secondary | ICD-10-CM

## 2013-04-06 MED ORDER — ASPIRIN 81 MG PO TABS: 81.0000 mg | ORAL_TABLET | Freq: Every day | ORAL | Status: DC

## 2013-04-06 MED ORDER — SODIUM CHLORIDE 0.9 % IV SOLN: 500.0000 mL | INTRAVENOUS | Status: DC

## 2013-04-06 NOTE — Op Note (Signed)
[facility] [address] [place], [address]   COLONOSCOPY PROCEDURE REPORT  PATIENT: Mark Soto, Mark Soto  MR#: [id number] BIRTHDATE: [birth year] , 71  yrs. old GENDER: Male ENDOSCOPIST: Iva Boop, MD, [facility] REFERRED [id number] Katrinka Blazing, M.D. PROCEDURE DATE:  04/06/2013 PROCEDURE:   Colonoscopy with snare polypectomy First Screening Colonoscopy - Avg.  risk and is 50 yrs.  old or older Yes.  Prior Negative Screening - Now for repeat screening. N/A  History of Adenoma - Now for follow-up colonoscopy & has been > or = to 3 yrs.  N/A  Polyps Removed Today? Yes. ASA CLASS:   Class II INDICATIONS:average risk screening and first colonoscopy. MEDICATIONS: Propofol (Diprivan) 230 mg IV, MAC sedation, administered by CRNA, and These medications were titrated to patient response per physician's verbal order  DESCRIPTION OF PROCEDURE:   After the risks benefits and alternatives of the procedure were thoroughly explained, informed consent was obtained.  A digital rectal exam revealed no abnormalities of the rectum, A digital rectal exam revealed no prostatic nodules, and A digital rectal exam revealed the prostate was not enlarged.   The LB [id number]  endoscope was introduced through the anus and advanced to the cecum, which was identified by both the appendix and ileocecal valve. No adverse events experienced.   The quality of the prep was [employer] The instrument was then slowly withdrawn as the colon was fully examined.   COLON FINDINGS: Two sessile polyps measuring 5 and 15 mm in size were found at the cecum (15mm) and hepatic flexure (5mm).  A polypectomy was performed with a cold snare (hepatic flexure)and using snare cautery and pievcemeal technique (15mm).  The resection was complete and the polyp tissue was completely retrieved.   The colon mucosa was otherwise normal.  Retroflexed views revealed no abnormalities. The time to cecum=1 minutes 14  seconds.  Withdrawal time=15 minutes 42 seconds.  The scope was withdrawn and the procedure completed. COMPLICATIONS: There were no complications.  ENDOSCOPIC IMPRESSION: 1.   Two sessile polyps measuring 5 and 15 mm in size were found at the cecum and hepatic flexure; polypectomy was performed with a cold snare and using snare cautery 2.   The colon mucosa was otherwise normal  RECOMMENDATIONS: 1.  Hold aspirin, aspirin products, and anti-inflammatory medication for 2 weeks. 2.  Timing of repeat colonoscopy will be determined by pathology findings. Could need 1 year repeat due to piecemeal cecal polypectomy.  eSigned:  Iva Boop, MD, [facility] 04/06/2013 2:55 PM cc: Nilda Simmer, MD and The Patient

## 2013-04-06 NOTE — Progress Notes (Signed)
Procedure ends, to recovery, report given and VSS. 

## 2013-04-06 NOTE — Progress Notes (Signed)
Pt requested Mark Soto to give his cell phone to his daughter, Pieter Partridge.  Done so at 13:46. Maw

## 2013-04-06 NOTE — Patient Instructions (Addendum)
I found and removed 2 polyps - they look benign. One was on the large side and could mean you need a recheck by 1 year to be sure I removed it all today.  I will let you know pathology results and when to have another routine colonoscopy by mail.  I appreciate the opportunity to care for you. Iva Boop, MD, FACG   YOU HAD AN ENDOSCOPIC PROCEDURE TODAY AT THE [facility]: Refer to the procedure report that was given to you for any specific questions about what was found during the examination.  If the procedure report does not answer your questions, please call your gastroenterologist to clarify.  If you requested that your care partner not be given the details of your procedure findings, then the procedure report has been included in a sealed envelope for you to review at your convenience later.  YOU SHOULD EXPECT: Some feelings of bloating in the abdomen. Passage of more gas than usual.  Walking can help get rid of the air that was put into your GI tract during the procedure and reduce the bloating. If you had a lower endoscopy (such as a colonoscopy or flexible sigmoidoscopy) you may notice spotting of blood in your stool or on the toilet paper. If you underwent a bowel prep for your procedure, then you may not have a normal bowel movement for a few days.  DIET: Your first meal following the procedure should be a light meal and then it is ok to progress to your normal diet.  A half-sandwich or bowl of soup is an example of a good first meal.  Heavy or fried foods are harder to digest and may make you feel nauseous or bloated.  Likewise meals heavy in dairy and vegetables can cause extra gas to form and this can also increase the bloating.  Drink plenty of fluids but you should avoid alcoholic beverages for 24 hours.  ACTIVITY: Your care partner should take you home directly after the procedure.  You should plan to take it easy, moving slowly for the rest of the day.  You can resume  normal activity the day after the procedure however you should NOT DRIVE or use heavy machinery for 24 hours (because of the sedation medicines used during the test).    SYMPTOMS TO REPORT IMMEDIATELY: A gastroenterologist can be reached at any hour.  During normal business hours, 8:30 AM to 5:00 PM Monday through Friday, call [phone].  After hours and on weekends, please call the GI answering service at [phone] who will take a message and have the physician on call contact you.   Following lower endoscopy (colonoscopy or flexible sigmoidoscopy):  Excessive amounts of blood in the stool  Significant tenderness or worsening of abdominal pains  Swelling of the abdomen that is new, acute  Fever of 100F or higher  FOLLOW UP: If any biopsies were taken you will be contacted by phone or by letter within the next 1-3 weeks.  Call your gastroenterologist if you have not heard about the biopsies in 3 weeks.  Our staff will call the home number listed on your records the next business day following your procedure to check on you and address any questions or concerns that you may have at that time regarding the information given to you following your procedure. This is a courtesy call and so if there is no answer at the home number and we have not heard from you through the emergency  physician on call, we will assume that you have returned to your regular daily activities without incident.  SIGNATURES/CONFIDENTIALITY: You and/or your care partner have signed paperwork which will be entered into your electronic medical record.  These signatures attest to the fact that that the information above on your After Visit Summary has been reviewed and is understood.  Full responsibility of the confidentiality of this discharge information lies with you and/or your care-partner.  HOLD YOUR ASPIRIN, IBUPROFEN, ALEVE AND NSAIDS FOR 2 WEEKS 04/21/2013

## 2013-04-06 NOTE — Progress Notes (Signed)
Called to room to assist during endoscopic procedure.  Patient ID and intended procedure confirmed with present staff. Received instructions for my participation in the procedure from the performing physician.  

## 2013-04-06 NOTE — Progress Notes (Addendum)
Patient did not have preoperative order for IV antibiotic SSI prophylaxis. (G8918)  Patient did not experience any of the following events: a burn prior to discharge; a fall within the facility; wrong site/side/patient/procedure/implant event; or a hospital transfer or hospital admission upon discharge from the facility. (G8907)  

## 2013-04-07 ENCOUNTER — Telehealth: Payer: Self-pay

## 2013-04-07 NOTE — Telephone Encounter (Signed)
  Follow up Call-  Call back number 04/06/2013  Post procedure Call Back phone  #  [phone]  Permission to leave phone message Yes     Patient questions:  Do you have a fever, pain , or abdominal swelling? no Pain Score  0 *  Have you tolerated food without any problems? yes  Have you been able to return to your normal activities? yes  Do you have any questions about your discharge instructions: Diet   no Medications  no Follow up visit  no  Do you have questions or concerns about your Care? no  Actions: * If pain score is 4 or above: No action needed, pain <4.

## 2013-04-14 ENCOUNTER — Encounter: Payer: Self-pay | Admitting: Internal Medicine

## 2013-04-14 DIAGNOSIS — Z860100 Personal history of colon polyps, unspecified: Secondary | ICD-10-CM

## 2013-04-14 HISTORY — DX: Personal history of colonic polyps: Z86.010

## 2013-04-14 HISTORY — DX: Personal history of colon polyps, unspecified: Z86.0100

## 2013-04-14 NOTE — Progress Notes (Signed)
Quick Note:  5 and 15 mm adenomas 1 year repeat 2015 due to sessile cecal piecemeal polypectomy ______

## 2013-04-17 ENCOUNTER — Telehealth: Payer: Self-pay | Admitting: Internal Medicine

## 2013-04-17 NOTE — Telephone Encounter (Signed)
2 polyps removed on 04/06/13 one was piecemeal in the cecum.  He did not have any bleeding until Saturday.  On Saturday he had a small to moderate amount of bleeding with a single BM, Sunday a "very tiny amount", and today just a "little smear"  "almost nothing".  He is advised that we can continue to watch it.  If he has any further bleeding he is not call me back to be worked in with an extender.  He agrees with this plan and verbalized understanding to call back.  He has no other GI complaints.

## 2013-04-18 NOTE — Telephone Encounter (Signed)
Agree with management

## 2013-05-23 ENCOUNTER — Ambulatory Visit: Payer: Medicare Other | Admitting: Family Medicine

## 2013-05-24 ENCOUNTER — Ambulatory Visit: Payer: Medicare Other | Admitting: Family Medicine

## 2014-04-23 ENCOUNTER — Encounter: Payer: Self-pay | Admitting: Internal Medicine

## 2014-05-10 ENCOUNTER — Other Ambulatory Visit: Payer: Self-pay | Admitting: Family Medicine

## 2014-05-21 ENCOUNTER — Telehealth: Payer: Self-pay | Admitting: *Deleted

## 2014-05-21 NOTE — Telephone Encounter (Signed)
Phoned & spoke with patient & scheduled AMWE (CPE) with Dr. Tamala Julian for 07/18/14 @ 1400.

## 2014-06-10 DIAGNOSIS — A530 Latent syphilis, unspecified as early or late: Secondary | ICD-10-CM

## 2014-06-10 HISTORY — DX: Latent syphilis, unspecified as early or late: A53.0

## 2014-06-12 ENCOUNTER — Ambulatory Visit (INDEPENDENT_AMBULATORY_CARE_PROVIDER_SITE_OTHER): Payer: Medicare Other | Admitting: Internal Medicine

## 2014-06-12 VITALS — BP 154/84 | HR 82 | Temp 97.9°F | Resp 18 | Ht 71.0 in | Wt 249.0 lb

## 2014-06-12 DIAGNOSIS — R413 Other amnesia: Secondary | ICD-10-CM

## 2014-06-12 DIAGNOSIS — I10 Essential (primary) hypertension: Secondary | ICD-10-CM

## 2014-06-12 LAB — POCT CBC
Granulocyte percent: 69.6 %G (ref 37–80)
HCT, POC: 40.2 % — AB (ref 43.5–53.7)
Hemoglobin: 12.6 g/dL — AB (ref 14.1–18.1)
Lymph, poc: 1.9 (ref 0.6–3.4)
MCH, POC: 23.5 pg — AB (ref 27–31.2)
MCHC: 31.3 g/dL — AB (ref 31.8–35.4)
MCV: 75.2 fL — AB (ref 80–97)
MID (cbc): 0.6 (ref 0–0.9)
MPV: 9.8 fL (ref 0–99.8)
PLATELET COUNT, POC: 193 10*3/uL (ref 142–424)
POC GRANULOCYTE: 5.6 (ref 2–6.9)
POC LYMPH %: 23.4 % (ref 10–50)
POC MID %: 7 %M (ref 0–12)
RBC: 5.34 M/uL (ref 4.69–6.13)
RDW, POC: 17.4 %
WBC: 8 10*3/uL (ref 4.6–10.2)

## 2014-06-12 LAB — COMPREHENSIVE METABOLIC PANEL
ALBUMIN: 4.3 g/dL (ref 3.5–5.2)
ALK PHOS: 78 U/L (ref 39–117)
ALT: 11 U/L (ref 0–53)
AST: 15 U/L (ref 0–37)
BUN: 16 mg/dL (ref 6–23)
CHLORIDE: 103 meq/L (ref 96–112)
CO2: 27 mEq/L (ref 19–32)
Calcium: 9.2 mg/dL (ref 8.4–10.5)
Creat: 1.02 mg/dL (ref 0.50–1.35)
Glucose, Bld: 113 mg/dL — ABNORMAL HIGH (ref 70–99)
Potassium: 4.8 mEq/L (ref 3.5–5.3)
SODIUM: 140 meq/L (ref 135–145)
Total Bilirubin: 0.5 mg/dL (ref 0.2–1.2)
Total Protein: 7.5 g/dL (ref 6.0–8.3)

## 2014-06-12 NOTE — Progress Notes (Signed)
Subjective:    Patient ID: Mark Soto, male    DOB: [birth year], 73 y.o.   MRN: [id number] This chart was scribed for Tami Lin, MD by Marti Sleigh, Medical Scribe. This patient was seen in Room 7 and the patient's care was started a 10:20 AM.  Chief Complaint  Patient presents with  . Memory Loss    this am, didnt remember where he was, his wife asked him to do something and he acted as if he knew what he was doing and where he was going but he didnt  I was called to see patient urgently due to concern for possible stroke  HPI HPI Comments: Mark Soto is a 73 y.o. male who presents to [facility] complaining of apparent  confusion that began this morning and resolved within half an hour. No assoc HA, dizziness, weakness,vision loss, change in gait. Pt states that he has had this problem in the past, approximately once per week for several weeks. Was asked by wife to do something and he wandered around without doing anything for several minutes and does not remember the wandering or any inability to speak or do things. He now is back to normal AND even drove here without any problems. No hx prior CVA or MI. HTN many yrs on meds--PCP Dr Tamala Julian [facility]. Pt states his wife has been complaining that he has been more forgetful lately. Pt states he has never had any trouble driving in the past and has never become confused about where he is going while driving. Pt states he takes an aspirin some days. Wife not here to give hx.  Pt endorses occas mild weakness in hands and feet, as well as occas altered vision with blurriness and spots. Pt denies past hx stroke, MI or cataract surgery.   No etoh/drugs. No fever,night sweats. No hx metabolic issues.  Prior to Admission medications   Medication Sig Start Date End Date Taking? Authorizing Provider  amLODipine (NORVASC) 2.5 MG tablet Take 1 tablet (2.5 mg total) by mouth daily. 03/22/13  Yes Wardell Honour, MD  aspirin 81 MG tablet Take 1 tablet  (81 mg total) by mouth daily. STOP TAKING UNTIL April 21, 2013 04/06/13  Yes Gatha Mayer, MD  hydrochlorothiazide (HYDRODIURIL) 25 MG tablet TAKE 1 TABLE  25 MG TOTAL BY MOUTH DAILY  NEEDS FOLLOW UP FOR ADDITION REFILLS 05/11/14  Yes Chelle S Jeffery, PA-C  FLUZONE HIGH-DOSE 0.5 ML SUSY  05/17/14   Historical Provider, MD  Recent colonoscopy  Review of Systems  Constitutional: Negative for activity change, fatigue and unexpected weight change.  HENT: Negative for trouble swallowing.   Eyes: Negative for photophobia.  Respiratory: Negative for cough, chest tightness and shortness of breath.   Cardiovascular: Negative for chest pain, palpitations and leg swelling.  Skin: Negative for rash.  Neurological: Negative for dizziness, tremors, speech difficulty, numbness and headaches.       Memory problem.  Psychiatric/Behavioral: Negative for sleep disturbance and dysphoric mood. The patient is not nervous/anxious.        Objective:  BP 182/100 mmHg  Pulse 82  Temp(Src) 97.9 F (36.6 C) (Oral)  Resp 18  Ht 5\' 11"  (1.803 m)  Wt 249 lb (112.946 kg)  BMI 34.74 kg/m2  SpO2 98%  Physical Exam  Constitutional: He is oriented to person, place, and time. He appears well-developed and well-nourished. No distress.  HENT:  Head: Normocephalic and atraumatic.  Mouth/Throat: Oropharynx is clear and moist.  Eyes: Conjunctivae and EOM  are normal. Pupils are equal, round, and reactive to light.  Neck: Normal range of motion. Neck supple. No thyromegaly present.  Cardiovascular: Normal rate, regular rhythm, normal heart sounds and intact distal pulses.  Exam reveals no gallop.   No murmur heard. Negative carotid bruits.  Pulmonary/Chest: Effort normal and breath sounds normal. No respiratory distress.  Musculoskeletal: He exhibits no edema.  Lymphadenopathy:    He has no cervical adenopathy.  Neurological: He is alert and oriented to person, place, and time. He has normal reflexes. No cranial  nerve deficit. He exhibits normal muscle tone. Coordination normal.  Identifies day tho thinks 11/4 but knew voting day Knows addr and # etc No hallucinations Speaks with ease,distinctly  Skin: Skin is warm and dry. He is not diaphoretic.  Psychiatric: He has a normal mood and affect. His behavior is normal.  Nursing note and vitals reviewed. repeat BP 154/84 Results for orders placed or performed in visit on 06/12/14  Comprehensive metabolic panel  Result Value Ref Range   Sodium 140 135 - 145 mEq/L   Potassium 4.8 3.5 - 5.3 mEq/L   Chloride 103 96 - 112 mEq/L   CO2 27 19 - 32 mEq/L   Glucose, Bld 113 (H) 70 - 99 mg/dL   BUN 16 6 - 23 mg/dL   Creat 1.02 0.50 - 1.35 mg/dL   Total Bilirubin 0.5 0.2 - 1.2 mg/dL   Alkaline Phosphatase 78 39 - 117 U/L   AST 15 0 - 37 U/L   ALT 11 0 - 53 U/L   Total Protein 7.5 6.0 - 8.3 g/dL   Albumin 4.3 3.5 - 5.2 g/dL   Calcium 9.2 8.4 - 10.5 mg/dL  POCT CBC  Result Value Ref Range   WBC 8.0 4.6 - 10.2 K/uL   Lymph, poc 1.9 0.6 - 3.4   POC LYMPH PERCENT 23.4 10 - 50 %L   MID (cbc) 0.6 0 - 0.9   POC MID % 7.0 0 - 12 %M   POC Granulocyte 5.6 2 - 6.9   Granulocyte percent 69.6 37 - 80 %G   RBC 5.34 4.69 - 6.13 M/uL   Hemoglobin---stable 12.6 (A) 14.1 - 18.1 g/dL   HCT, POC 40.2 (A) 43.5 - 53.7 %   MCV 75.2 (A) 80 - 97 fL   MCH, POC 23.5 (A) 27 - 31.2 pg   MCHC 31.3 (A) 31.8 - 35.4 g/dL   RDW, POC 17.4 %   Platelet Count, POC 193 142 - 424 K/uL   MPV 9.8 0 - 99.8 fL       Assessment & Plan:   I have completed the patient encounter in its entirety as documented by the scribe, with editing by me where necessary. Trystyn Sitts P. Laney Pastor, M.D.  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

## 2014-06-13 ENCOUNTER — Inpatient Hospital Stay (HOSPITAL_COMMUNITY): Payer: Medicare Other

## 2014-06-13 ENCOUNTER — Emergency Department (HOSPITAL_COMMUNITY): Payer: Medicare Other

## 2014-06-13 ENCOUNTER — Inpatient Hospital Stay (HOSPITAL_COMMUNITY)
Admission: EM | Admit: 2014-06-13 | Discharge: 2014-06-19 | DRG: 065 | Disposition: A | Discharge status: Skilled Nursing Facility | Payer: Medicare Other | Attending: Family Medicine | Admitting: Family Medicine

## 2014-06-13 DIAGNOSIS — F0390 Unspecified dementia without behavioral disturbance: Secondary | ICD-10-CM | POA: Diagnosis present

## 2014-06-13 DIAGNOSIS — R4182 Altered mental status, unspecified: Secondary | ICD-10-CM | POA: Diagnosis present

## 2014-06-13 DIAGNOSIS — Q211 Atrial septal defect: Secondary | ICD-10-CM

## 2014-06-13 DIAGNOSIS — I63532 Cerebral infarction due to unspecified occlusion or stenosis of left posterior cerebral artery: Secondary | ICD-10-CM

## 2014-06-13 DIAGNOSIS — H53461 Homonymous bilateral field defects, right side: Secondary | ICD-10-CM | POA: Diagnosis present

## 2014-06-13 DIAGNOSIS — I10 Essential (primary) hypertension: Secondary | ICD-10-CM | POA: Diagnosis not present

## 2014-06-13 DIAGNOSIS — R7309 Other abnormal glucose: Secondary | ICD-10-CM | POA: Diagnosis present

## 2014-06-13 DIAGNOSIS — Z7982 Long term (current) use of aspirin: Secondary | ICD-10-CM | POA: Diagnosis not present

## 2014-06-13 DIAGNOSIS — Z79899 Other long term (current) drug therapy: Secondary | ICD-10-CM

## 2014-06-13 DIAGNOSIS — A528 Late syphilis, latent: Secondary | ICD-10-CM | POA: Insufficient documentation

## 2014-06-13 DIAGNOSIS — I63432 Cerebral infarction due to embolism of left posterior cerebral artery: Secondary | ICD-10-CM | POA: Diagnosis present

## 2014-06-13 DIAGNOSIS — A539 Syphilis, unspecified: Secondary | ICD-10-CM

## 2014-06-13 DIAGNOSIS — R400 Somnolence: Secondary | ICD-10-CM

## 2014-06-13 DIAGNOSIS — Q2112 Patent foramen ovale: Secondary | ICD-10-CM | POA: Insufficient documentation

## 2014-06-13 DIAGNOSIS — G4089 Other seizures: Secondary | ICD-10-CM | POA: Diagnosis present

## 2014-06-13 DIAGNOSIS — E785 Hyperlipidemia, unspecified: Secondary | ICD-10-CM | POA: Insufficient documentation

## 2014-06-13 DIAGNOSIS — I639 Cerebral infarction, unspecified: Secondary | ICD-10-CM | POA: Insufficient documentation

## 2014-06-13 DIAGNOSIS — N401 Enlarged prostate with lower urinary tract symptoms: Secondary | ICD-10-CM | POA: Diagnosis present

## 2014-06-13 DIAGNOSIS — Z6832 Body mass index (BMI) 32.0-32.9, adult: Secondary | ICD-10-CM

## 2014-06-13 DIAGNOSIS — F0391 Unspecified dementia with behavioral disturbance: Secondary | ICD-10-CM

## 2014-06-13 DIAGNOSIS — E669 Obesity, unspecified: Secondary | ICD-10-CM | POA: Diagnosis not present

## 2014-06-13 DIAGNOSIS — R569 Unspecified convulsions: Secondary | ICD-10-CM

## 2014-06-13 DIAGNOSIS — I6340 Cerebral infarction due to embolism of unspecified cerebral artery: Secondary | ICD-10-CM

## 2014-06-13 HISTORY — DX: Cerebral infarction due to unspecified occlusion or stenosis of left posterior cerebral artery: I63.532

## 2014-06-13 HISTORY — DX: Unspecified convulsions: R56.9

## 2014-06-13 LAB — URINALYSIS, ROUTINE W REFLEX MICROSCOPIC
Bilirubin Urine: NEGATIVE
GLUCOSE, UA: NEGATIVE mg/dL
Ketones, ur: NEGATIVE mg/dL
LEUKOCYTES UA: NEGATIVE
Nitrite: NEGATIVE
PROTEIN: NEGATIVE mg/dL
SPECIFIC GRAVITY, URINE: 1.017 (ref 1.005–1.030)
Urobilinogen, UA: 1 mg/dL (ref 0.0–1.0)
pH: 6.5 (ref 5.0–8.0)

## 2014-06-13 LAB — BASIC METABOLIC PANEL
ANION GAP: 11 (ref 5–15)
BUN: 15 mg/dL (ref 6–23)
CO2: 26 mEq/L (ref 19–32)
Calcium: 9.1 mg/dL (ref 8.4–10.5)
Chloride: 102 mEq/L (ref 96–112)
Creatinine, Ser: 0.9 mg/dL (ref 0.50–1.35)
GFR, EST NON AFRICAN AMERICAN: 83 mL/min — AB (ref >90)
Glucose, Bld: 145 mg/dL — ABNORMAL HIGH (ref 70–99)
POTASSIUM: 4.5 meq/L (ref 3.7–5.3)
Sodium: 139 mEq/L (ref 137–147)

## 2014-06-13 LAB — CBC WITH DIFFERENTIAL/PLATELET
Basophils Absolute: 0 10*3/uL (ref 0.0–0.1)
Basophils Relative: 0 % (ref 0–1)
Eosinophils Absolute: 0 10*3/uL (ref 0.0–0.7)
Eosinophils Relative: 0 % (ref 0–5)
HCT: 37.1 % — ABNORMAL LOW (ref 39.0–52.0)
HEMOGLOBIN: 11.9 g/dL — AB (ref 13.0–17.0)
Lymphocytes Relative: 8 % — ABNORMAL LOW (ref 12–46)
Lymphs Abs: 0.7 10*3/uL (ref 0.7–4.0)
MCH: 23.7 pg — ABNORMAL LOW (ref 26.0–34.0)
MCHC: 32.1 g/dL (ref 30.0–36.0)
MCV: 73.8 fL — ABNORMAL LOW (ref 78.0–100.0)
MONOS PCT: 5 % (ref 3–12)
Monocytes Absolute: 0.4 10*3/uL (ref 0.1–1.0)
NEUTROS PCT: 87 % — AB (ref 43–77)
Neutro Abs: 7.5 10*3/uL (ref 1.7–7.7)
PLATELETS: 180 10*3/uL (ref 150–400)
RBC: 5.03 MIL/uL (ref 4.22–5.81)
RDW: 16 % — ABNORMAL HIGH (ref 11.5–15.5)
WBC: 8.7 10*3/uL (ref 4.0–10.5)

## 2014-06-13 LAB — URINE MICROSCOPIC-ADD ON

## 2014-06-13 LAB — TSH: TSH: 0.757 u[IU]/mL (ref 0.350–4.500)

## 2014-06-13 LAB — TROPONIN I: Troponin I: <0.3 ng/mL (ref <0.30)

## 2014-06-13 MED ORDER — AMLODIPINE BESYLATE 2.5 MG PO TABS
2.5000 mg | ORAL_TABLET | Freq: Every day | ORAL | Status: DC
  Administered 2014-06-14 – 2014-06-19 (×6): 2.5 mg via ORAL
  Filled 2014-06-13 (×7): qty 1

## 2014-06-13 MED ORDER — LEVETIRACETAM IN NACL 500 MG/100ML IV SOLN
500.0000 mg | Freq: Two times a day (BID) | INTRAVENOUS | Status: DC
Start: 2014-06-14 — End: 2014-06-19
  Administered 2014-06-14 – 2014-06-19 (×11): 500 mg via INTRAVENOUS
  Filled 2014-06-13 (×13): qty 100

## 2014-06-13 MED ORDER — ASPIRIN 81 MG PO TABS: 81.0000 mg | ORAL_TABLET | Freq: Every day | ORAL | Status: DC

## 2014-06-13 MED ORDER — LEVETIRACETAM IN NACL 1000 MG/100ML IV SOLN
1000.0000 mg | Freq: Once | INTRAVENOUS | Status: AC
  Administered 2014-06-13: 1000 mg via INTRAVENOUS
  Filled 2014-06-13: qty 100

## 2014-06-13 MED ORDER — HEPARIN SODIUM (PORCINE) 5000 UNIT/ML IJ SOLN
5000.0000 [IU] | Freq: Three times a day (TID) | INTRAMUSCULAR | Status: DC
  Administered 2014-06-13 – 2014-06-19 (×17): 5000 [IU] via SUBCUTANEOUS
  Filled 2014-06-13 (×20): qty 1

## 2014-06-13 MED ORDER — ACETAMINOPHEN 650 MG RE SUPP: 650.0000 mg | RECTAL | Status: DC | PRN

## 2014-06-13 MED ORDER — ACETAMINOPHEN 325 MG PO TABS: 650.0000 mg | ORAL_TABLET | ORAL | Status: DC | PRN

## 2014-06-13 MED ORDER — SODIUM CHLORIDE 0.9 % IV SOLN
INTRAVENOUS | Status: DC
  Administered 2014-06-13 – 2014-06-14 (×2): via INTRAVENOUS

## 2014-06-13 MED ORDER — GADOBENATE DIMEGLUMINE 529 MG/ML IV SOLN
20.0000 mL | Freq: Once | INTRAVENOUS | Status: AC | PRN
  Administered 2014-06-13: 20 mL via INTRAVENOUS

## 2014-06-13 MED ORDER — LORAZEPAM 2 MG/ML IJ SOLN
1.0000 mg | Freq: Once | INTRAMUSCULAR | Status: AC
  Administered 2014-06-13: 1 mg via INTRAVENOUS
  Filled 2014-06-13: qty 1

## 2014-06-13 MED ORDER — ASPIRIN EC 81 MG PO TBEC
81.0000 mg | DELAYED_RELEASE_TABLET | Freq: Every day | ORAL | Status: DC
  Administered 2014-06-14: 81 mg via ORAL
  Filled 2014-06-13: qty 1

## 2014-06-13 MED ORDER — SODIUM CHLORIDE 0.9 % IV BOLUS (SEPSIS)
500.0000 mL | Freq: Once | INTRAVENOUS | Status: AC
Start: 2014-06-13 — End: 2014-06-13
  Administered 2014-06-13: 500 mL via INTRAVENOUS

## 2014-06-13 MED ORDER — ACETAMINOPHEN 325 MG PO TABS
650.0000 mg | ORAL_TABLET | Freq: Once | ORAL | Status: AC
  Administered 2014-06-13: 650 mg via ORAL
  Filled 2014-06-13: qty 2

## 2014-06-13 MED ORDER — HYDROCHLOROTHIAZIDE 25 MG PO TABS
25.0000 mg | ORAL_TABLET | Freq: Every day | ORAL | Status: DC
  Administered 2014-06-14 – 2014-06-19 (×6): 25 mg via ORAL
  Filled 2014-06-13 (×7): qty 1
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
## 2014-06-13 NOTE — ED Notes (Signed)
Neurology at bedside.

## 2014-06-13 NOTE — ED Notes (Addendum)
Pt suddenly started seizing. Muscles became contracted which lasted about 20 seconds and then pt began jerking all over. Entire episode lasted about 1 minute.

## 2014-06-13 NOTE — ED Notes (Signed)
EEG at bedside.
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
## 2014-06-13 NOTE — Progress Notes (Signed)
EEG completed; results pending.    

## 2014-06-13 NOTE — H&P (Signed)
[facility] Admission History and Physical Service Pager: [phone]  Patient name: Mark Soto Medical record number: [id number] Date of birth: July 23, 1941 Age: 73 y.o. Gender: male  Primary Care Provider: Reginia Forts, MD Consultants: neuro Code Status: FULL   Chief Complaint: AMS  Assessment and Plan: Mark Soto is a 73 y.o. male presenting with AMS, confusion, and seizure in ED. PMH is significant for HTN, anemia, prior AMS.  #Seizure: patient with apparent tonic clonic seizure lasting 1 minute. Received 2mg  IV ativan and 1000mg  IV keppra. No hx of seizures. No significant electrolyte abnormalities. No new meds per family although unable to obtain history from patient due to sedation - EEG - MRI - Neuro consult, appreciate recs - antiepileptics per neuro recs  # AMS: Patient with 3 year hx of gradually worsening confusion, acutely worse today. Likely dementia with  - EEG, MRI, and neuro consult as above - b12, folate, hiv, rpr, tsh - U/A pending - delirium precautions while in hospital - speech therapy  # HTN: hx of hypertension, home meds include amlodipine and HCTZ. BPs currently 387F-643P systolic.  - continue amlodipine 2.5mg  daily and HCTZ 25 mg daily - consider increasing amlodipine to 5mg  if BPs continue to be high  FEN/GI: NS at 100/hr; npo currently, diet pending mental clearing and swallow eval Prophylaxis: subq heparin  Disposition: admit to tele floor, attending Dr. Nori Riis  History of Present Illness: Mark Soto is a 73 y.o. male presenting with AMS and dizziness. Hx obtained from ED staff as patient was heavily sedated (ED staff obtained hx from family who were not present at time of this interview). For past 3 years, patient has been getting increasingly confused, at times wandering off. He also intermittently complains of dizziness. Today he was dropping his son off at the adult enrichment center and the staff there  noted him to be more confused than normal so they called EMS who brought him to the ED. Per family, his doctor previously recommended a medication for his confusion/wandering, but patient refused it. No hx of prior CVA of MI. He denies headache, changes in vision, weakness, slurred speech, numbness/tingling, chest pain, SOB, abdominal pain, nausea, vomiting, diaphoresis, dysuria.  In the ED: patient had what appeared to be a tonic clonic seizure that lasted approximately 1 minute. Ativan 2 mg IV and Keppra 1000 mg IV were given. CT Head showed no acute abnormalities. Patient has no hx of seizures. Patient's son does have seizures.   Per records, patient was seen by urgent care yesterday with complaint of increased confusion and that he has been more forgetful lately. An MRI was ordered but has not been performed yet. Patient was advised to not drive unless accompanied.   Review Of Systems: From clinic note yesterday: Pt endorses occas mild weakness in hands and feet, as well as occas altered vision with blurriness and spots. Pt denies past hx stroke, MI or cataract surgery.Otherwise 12 point review of systems was performed and was unremarkable.  Patient Active Problem List   Diagnosis Date Noted  . Personal history of colonic adenomas 04/14/2013   Past Medical History: Past Medical History  Diagnosis Date  . Hypertension   . Tinnitus     Right Ear  . Anemia   . Abdominal wall hernia   . Personal history of colonic adenomas 04/14/2013   Past Surgical History: Past Surgical History  Procedure Laterality Date  . None     Social History: History  Substance Use Topics  .  Smoking status: Never Smoker   . Smokeless tobacco: Never Used  . Alcohol Use: No   Additional social history:  History   Social History Narrative   Lives with wife at home and 67 year old son. Son has special needs, sleep apnea, & severe seizure disorder. Wife has "many healthy problems". Another son in [place]. Daughter  in [place].       3 sisters live in [place]. 1 brother in [place].       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  Family History: Family History  Problem Relation Age of Onset  . Breast cancer Mother   . COPD Father   . Hypertension Sister   . Hypertension Brother   . Hypertension Sister   . Liver cancer Mother   . Lung cancer Mother   . Colon cancer Neg Hx   . Colon polyps Neg Hx   . Stomach cancer Neg Hx    Allergies and Medications: No Known Allergies No current facility-administered medications on file prior to encounter.   Current Outpatient Prescriptions on File Prior to Encounter  Medication Sig Dispense Refill  . amLODipine (NORVASC) 2.5 MG tablet Take 1 tablet (2.5 mg total) by mouth daily. 30 tablet 5  . aspirin 81 MG tablet Take 1 tablet (81 mg total) by mouth daily. STOP TAKING UNTIL April 21, 2013 30 tablet   . FLUZONE HIGH-DOSE 0.5 ML SUSY   0  . hydrochlorothiazide (HYDRODIURIL) 25 MG tablet TAKE 1 TABLE  25 MG TOTAL BY MOUTH DAILY  NEEDS FOLLOW UP FOR ADDITION REFILLS 30 tablet 0    Objective: BP 146/71 mmHg  Pulse 100  Temp(Src) 97.6 F (36.4 C) (Oral)  Resp 25  SpO2 93% Exam: General: lying in bed, sedated, NAD HEENT: head atraumatic, PERRL, sclera anicteric, MMM Cardiovascular: RRR, no M/R/G Respiratory: normal WOB, CTAB Abdomen: NABS, obese, no masses palpated Extremities: trace edema bilaterally, warm and well-perfused Skin: no lesions or rashes Neuro: heavily sedated, unable to assess orientation, attention, memory, etc. PERRL, moves all 4 extremities spontaneously, unable to assess strength, sensation, or reflexes  Labs and Imaging: CBC BMET   Recent Labs Lab 06/13/14 1135  WBC 8.7  HGB 11.9*  HCT 37.1*  PLT 180    Recent Labs Lab 06/13/14 1135  NA 139  K 4.5  CL 102  CO2 26  BUN 15  CREATININE 0.90  GLUCOSE 145*  CALCIUM  9.1     EKG: sinus rhythm Abnormal R wave progression, early transition LVH with secondary repolarization abnormality Now w/ T wave inversions in V3-V6, I, II, and aVL  CT Head:  IMPRESSION: 1. No acute intracranial findings. 2. White matter microvascular disease. 3. Maxillary sinus inflammation.  Montel Clock, Med Student 06/13/2014, 2:32 PM [place] Intern pager: [phone], text pages welcome  I agree with the medical student note above and have edited it as necessary. I formulated the plan and performed my own physical exam which are documented above.  Beverlyn Roux, MD, MPH [facility] PGY-1 06/13/2014 3:43 PM

## 2014-06-13 NOTE — ED Notes (Signed)
Removed non-rebreather.

## 2014-06-13 NOTE — ED Notes (Signed)
Pt gone to CT 

## 2014-06-14 ENCOUNTER — Encounter (HOSPITAL_COMMUNITY): Payer: Self-pay | Admitting: General Practice

## 2014-06-14 DIAGNOSIS — R404 Transient alteration of awareness: Secondary | ICD-10-CM

## 2014-06-14 DIAGNOSIS — I639 Cerebral infarction, unspecified: Secondary | ICD-10-CM | POA: Insufficient documentation

## 2014-06-14 DIAGNOSIS — I6340 Cerebral infarction due to embolism of unspecified cerebral artery: Secondary | ICD-10-CM

## 2014-06-14 LAB — LIPID PANEL
CHOL/HDL RATIO: 3.2 ratio
Cholesterol: 171 mg/dL (ref 0–200)
HDL: 54 mg/dL (ref >39)
LDL CALC: 94 mg/dL (ref 0–99)
Triglycerides: 115 mg/dL (ref <150)
VLDL: 23 mg/dL (ref 0–40)

## 2014-06-14 LAB — RPR: RPR Ser Ql: REACTIVE — AB

## 2014-06-14 LAB — FOLATE

## 2014-06-14 LAB — HEMOGLOBIN A1C
Hgb A1c MFr Bld: 6.3 % — ABNORMAL HIGH (ref <5.7)
Mean Plasma Glucose: 134 mg/dL — ABNORMAL HIGH (ref <117)

## 2014-06-14 LAB — VITAMIN B12: VITAMIN B 12: 308 pg/mL (ref 211–911)

## 2014-06-14 LAB — HIV ANTIBODY (ROUTINE TESTING W REFLEX): HIV 1&2 Ab, 4th Generation: NONREACTIVE

## 2014-06-14 LAB — RPR TITER: RPR Titer: 1:16 {titer} — AB

## 2014-06-14 LAB — FLUORESCENT TREPONEMAL AB(FTA)-IGG-BLD: FLUORESCENT TREPONEMAL AB, IGG: REACTIVE — AB

## 2014-06-14 MED ORDER — ASPIRIN EC 325 MG PO TBEC
325.0000 mg | DELAYED_RELEASE_TABLET | Freq: Every day | ORAL | Status: DC
  Administered 2014-06-15 – 2014-06-19 (×5): 325 mg via ORAL
  Filled 2014-06-14 (×6): qty 1

## 2014-06-14 MED ORDER — SODIUM CHLORIDE 0.9 % IV SOLN
INTRAVENOUS | Status: DC
  Administered 2014-06-14 – 2014-06-15 (×2): via INTRAVENOUS

## 2014-06-14 MED ORDER — ATORVASTATIN CALCIUM 80 MG PO TABS
80.0000 mg | ORAL_TABLET | Freq: Every day | ORAL | Status: DC
Start: 2014-06-14 — End: 2014-06-19
  Administered 2014-06-14 – 2014-06-19 (×6): 80 mg via ORAL
  Filled 2014-06-14 (×6): qty 1

## 2014-06-14 NOTE — Clinical Social Work Psychosocial (Signed)
Clinical Social Work Department BRIEF PSYCHOSOCIAL ASSESSMENT 06/14/2014  Patient:  Mark Soto, Mark Soto     Account Number:  [id number]     Admit date:  06/13/2014  Clinical Social Worker:  Domenica Reamer, [place]  Date/Time:  06/14/2014 05:19 PM  Referred by:  Physician  Date Referred:  06/14/2014 Referred for  SNF Placement   Other Referral:   Interview type:  Patient Other interview type:   Mamie- patients sister    PSYCHOSOCIAL DATA Living Status:  FAMILY Admitted from facility:   Level of care:   Primary support name:  Maziah Keeling Primary support relationship to patient:  SPOUSE Degree of support available:   Patient reports high level of support- has a wife who is disabled but has two able bodied sisters who are very involved in his care.    CURRENT CONCERNS Current Concerns  Post-Acute Placement   Other Concerns:    SOCIAL WORK ASSESSMENT / PLAN CSW spoke with patient and patients sister concerning SNF placement.  Patient and sister are agreeable to bed search and state that they have heard good things about [facility]. Patient lives at home with his wife who is wheelchair bound and his son who is MR.  Patient is their primary caretaker.  CSW will continue to follow.   Assessment/plan status:  Psychosocial Support/Ongoing Assessment of Needs Other assessment/ plan:   FL2  PASAR   Information/referral to community resources:   [facility]    PATIENT'S/FAMILY'S RESPONSE TO PLAN OF CARE: Patients family is agreeable to SNF and believe that it is the best option for the patient at this time.       Domenica Reamer, [place] Social Worker [phone]

## 2014-06-14 NOTE — Evaluation (Signed)
Physical Therapy Evaluation Patient Details Name: Mark Soto MRN: [id number] DOB: September 28, 1940 Today's Date: 06/14/2014   History of Present Illness  Patient is a 73 y/o male presenting with AMS, confusion and progressing memory decline. While in ED pt had a witnessed seizure. CT head shows no acute infarct. PMH is significant for HTN, anemia, prior AMS. Per MD note: For the past 3 years, patient has been getting increasingly confused, at times wandering off. He also intermittently complains of dizziness. Pt was [facility] his [facility] the [facility]  and the staff there noted him to be more confused than normal so they called EMS who brought him to the ED. MRI brain-Areas of acute infarction in the left posterior cerebral artery.     Clinical Impression  Patient presents with functional limitations due to deficits listed in PT problem list (see below). Pt with balance deficits and impaired cognition impacting safe mobility. Pt with impaired safety awareness and insight- difficulty with word finding at times. Pt not safe to return home at this time as pt was sole caregiver to family PTA. Pt would benefit from acute PT and ST SNF to improve gait, balance and overall safe mobility so pt can maximize independence prior to return home.    Follow Up Recommendations SNF;Supervision/Assistance - 24 hour    Equipment Recommendations  Other (comment) (defer to SNF)    Recommendations for Other Services OT consult     Precautions / Restrictions Precautions Precautions: Fall Restrictions Weight Bearing Restrictions: No      Mobility  Bed Mobility               General bed mobility comments: Received sitting in chair upon PT arrival.  Transfers Overall transfer level: Needs assistance Equipment used: None Transfers: Sit to/from Stand Sit to Stand: Min guard         General transfer comment: Min guard for safety due to  impulsiveness.  Ambulation/Gait Ambulation/Gait assistance: Min assist Ambulation Distance (Feet): 15 Feet (+75') Assistive device: None (hand rail in hallway occasionally.) Gait Pattern/deviations: Step-through pattern;Decreased stride length;Narrow base of support;Staggering left;Staggering right Gait velocity: slow   General Gait Details: Pt with usteady gait pattern requiring need to hold onto hand rail for support at times. Difficulty following directional cues. Min A for balance/safety.   Stairs            Wheelchair Mobility    Modified Rankin (Stroke Patients Only) Modified Rankin (Stroke Patients Only) Pre-Morbid Rankin Score: No symptoms Modified Rankin: Moderately severe disability     Balance Overall balance assessment: Needs assistance Sitting-balance support: Feet supported Sitting balance-Leahy Scale: Fair     Standing balance support: During functional activity Standing balance-Leahy Scale: Fair Standing balance comment: Able to stand for urination however sway noted requiring Min guard for balance, requires UE support (on rail) for safety during dynamic standing/gait.                             Pertinent Vitals/Pain Pain Assessment: No/denies pain    Home Living Family/patient expects to be discharged to:: Skilled nursing facility Living Arrangements: Spouse/significant other;Children               Additional Comments: Per MD notes, pt is caregiver for wife and 54 y/o son.     Prior Function Level of Independence: Independent               Hand Dominance  Extremity/Trunk Assessment   Upper Extremity Assessment:  (not able to perform finger to thumb testing, seemed to have some difficulty with coordination. Reached for therapist's hand to shake it and overshoots it.)           Lower Extremity Assessment: Overall WFL for tasks assessed         Communication      Cognition Arousal/Alertness:  Awake/alert Behavior During Therapy: Impulsive Overall Cognitive Status: Impaired/Different from baseline Area of Impairment: Orientation;Memory;Following commands;Safety/judgement;Problem solving Orientation Level: Disoriented to;Time (Vaguely oriented to time, "2007" "November", able to state president)   Memory: Decreased short-term memory Following Commands: Follows one step commands with increased time;Follows multi-step commands inconsistently Safety/Judgement: Decreased awareness of safety;Decreased awareness of deficits   Problem Solving: Slow processing;Requires verbal cues General Comments: Pt with word finding difficulties and mild apraxia. Therapist handed pt a phone, what is this? "something you....try to walk with." Able to finally say, "like a cell phone" with verbal cues mimicing ringing. Increased time to name the walker when shown.     General Comments General comments (skin integrity, edema, etc.): After finishing urination in bathroom, pt reporting he needs to wash his hands and reaching under the toilet faucet to try to wash them there. Requires cues to get to sink and where to grab paper towels.    Exercises        Assessment/Plan    PT Assessment Patient needs continued PT services  PT Diagnosis Difficulty walking   PT Problem List Decreased cognition;Decreased balance;Decreased safety awareness;Decreased mobility  PT Treatment Interventions Balance training;Gait training;Neuromuscular re-education;Cognitive remediation;Patient/family education;Functional mobility training;Therapeutic activities;Therapeutic exercise   PT Goals (Current goals can be found in the Care Plan section) Acute Rehab PT Goals Patient Stated Goal: to get back to family PT Goal Formulation: With patient Time For Goal Achievement: 06/28/14 Potential to Achieve Goals: Fair    Frequency Min 3X/week   Barriers to discharge Decreased caregiver support Pt is caregiver for wife and son at  home.    Co-evaluation               End of Session Equipment Utilized During Treatment: Gait belt Activity Tolerance: Patient tolerated treatment well Patient left: in chair;with call bell/phone within reach;with chair alarm set;with nursing/sitter in room Nurse Communication: Mobility status         Time: 5176-1607 PT Time Calculation (min): 20 min   Charges:   PT Evaluation $Initial PT Evaluation Tier I: 1 Procedure PT Treatments $Gait Training: 8-22 mins   PT G CodesCandy Sledge A 06/14/2014, 4:38 PM  Candy Sledge, [place], DPT [phone]

## 2014-06-14 NOTE — Progress Notes (Signed)
    [facility] has been requested to perform a transesophageal echocardiogram on Mark Soto for stroke workup.  After careful review of history and examination, the risks and benefits of transesophageal echocardiogram have been explained including risks of esophageal damage, perforation (1:10,000 risk), bleeding, pharyngeal hematoma as well as other potential complications associated with conscious sedation including aspiration, arrhythmia, respiratory failure and death. Alternatives to treatment were discussed, questions were answered. Patient is willing to proceed.   Procedure is at 0900hrs.  Tarri Fuller, [facility] 06/14/2014 6:31 PM

## 2014-06-14 NOTE — Progress Notes (Signed)
Family Medicine Teaching Service Daily Progress Note Intern Pager: [phone]  Patient name: Mark Soto Medical record number: [id number] Date of birth: [birth year] Age: 73 y.o. Gender: male  Primary Care Provider: Reginia Forts, MD Consultants: neuro Code Status: FULL  Pt Overview and Major Events to Date:  06/13/14: patient admitted with acutely worsening AMS in setting of 3 year decline in memory and functioning at home; had seizure in ED, then found to have acute PCA infarct on MRI  Assessment and Plan: Mark Soto is a 73 y.o. male presenting with AMS, confusion, and seizure in ED in setting of acute PCA infarct on MRI. PMH is significant for HTN, anemia, prior AMS.  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 No hx of prior stroke. - stroke workup today: A1c, lipids, carotid dopplers, echo - on aspirin 81mg , will await neuro recs and echo results prior to adding further anti-platelet or anticoag meds - swallow eval today--speech consulted, npo until cleared by SLP - f/u PT/OT recs  #Seizure: patient with apparent tonic clonic seizure lasting 1 minute. No hx of seizures. No significant electrolyte abnormalities. 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 - on Keppra 500mg  IV BID, per neuro recs - EEG completed, final read pending - MRI completed, PCA stroke involving hypothalamus could have caused seizure per radiology - Neuro following, appreciate recs  # Dementia: Patient with 3 year hx of gradually worsening confusion, likely dementia. Acute PCA stroke likely caused his acute worsening.  - b12, folate, tsh all wnl, HIV negative - RPR reactive, FTA IgG pending - urine clean - delirium precautions while in hospital  # HTN: hx of hypertension, home meds include amlodipine and HCTZ. BPs currently 921J-941D systolic. Permissive HTN for at least 24 hrs post stroke.  - continue amlodipine 2.5mg  daily and HCTZ 25 mg  daily  FEN/GI: NS at 100/hr; npo currently, diet pending swallow eval  Prophylaxis: subq heparin  Disposition: wife desires placement in a SNF, consult SW; will touch base with family today as well regarding wishes for him  Subjective:  Patient seemed confused this morning, OOB without assistance trying to use bathroom, urinating on floor.   Objective: Temp:  [97.3 F (36.3 C)-98.5 F (36.9 C)] 97.3 F (36.3 C) (11/05 0544) Pulse Rate:  [75-105] 75 (11/05 0544) Resp:  [16-32] 18 (11/05 0544) BP: (143-183)/(61-93) 161/77 mmHg (11/05 0544) SpO2:  [92 %-100 %] 96 % (11/05 0544) Weight:  [238 lb 9.6 oz (108.228 kg)] 238 lb 9.6 oz (108.228 kg) (11/04 1741) Physical Exam: General: NAD, sitting up in bed Cardiovascular: RRR, no M/R/G Respiratory: normal wob, CTAB Abdomen: obese, NABS, nontender, nondistended, no masses or HSM Extremities: trace edema bilaterally, warm and well-perfused Neuro: alert, oriented to person and place but not to year, month, or day; PERRLA, EOMI, ptosis of right eye, face symmetric, facial sensation intact to light touch bilaterally, palatal rise symmetric, bilateral shoulder shrug, tongue midline, strength 5/5 in bilateral upper and lower extremities and grip, sensation to light touch intact in bilateral upper and lower extremities, gait unstable, biceps, triceps, patellar reflexes 2+ bilaterally, negative babinski bilaterally   Laboratory:  Recent Labs Lab 06/12/14 1107 06/13/14 1135  WBC 8.0 8.7  HGB 12.6* 11.9*  HCT 40.2* 37.1*  PLT  --  180    Recent Labs Lab 06/12/14 1104 06/13/14 1135  NA 140 139  K 4.8 4.5  CL 103 102  CO2 27 26  BUN 16 15  CREATININE 1.02 0.90  CALCIUM 9.2 9.1  PROT 7.5  --   BILITOT 0.5  --   ALKPHOS 78  --   ALT 11  --   AST 15  --   GLUCOSE 113* 145*   Vit B12: 308 Folate: >20.0 TSH: 0.757 RPR: Reactive, 1:16 titer HIV: nonreactive  Imaging/Diagnostic Tests: EKG: sinus rhythm Abnormal R wave  progression, early transition LVH with secondary repolarization abnormality Now w/ T wave inversions in V3-V6, I, II, and aVL  CT Head:  IMPRESSION: 1. No acute intracranial findings. 2. White matter microvascular disease. 3. Maxillary sinus inflammation.  MRI: IMPRESSION: Areas of acute infarction in the left posterior cerebral artery territory affecting the occipital cortical and subcortical brain. There is some involvement of the hypothalamus adjacent to the atrium of the left lateral ventricle, which could relate to the seizure. No hemorrhage or mass effect. Extensive chronic small vessel ischemic changes elsewhere throughout the brain. No anterior circulation pathology seen. Diminished flow within 1 of the 2 main left PCA branches distally.   Montel Clock, Med Student 06/14/2014, 9:21 AM [place] Intern pager: [phone], text pages welcome  I agree with the medical student note above and have edited it as necessary. I formulated the plan and performed my own physical exam which are documented above.  Beverlyn Roux, MD, MPH [place] [facility] PGY-2 06/14/2014 1:21 PM

## 2014-06-14 NOTE — Discharge Summary (Signed)
[facility] Discharge Summary  Patient name: Mark Soto Medical record number: [id number] Date of birth: [birth year] Age: 73 y.o. Gender: male Date of Admission: 06/13/2014  Date of Discharge: 06/19/2014 Admitting Physician: Dickie La, MD  Primary Care Provider: Reginia Forts, MD Consultants: neurology, ID,   Indication for Hospitalization: AMS, stroke, seizure  Discharge Diagnoses/Problem List:  - Left PCA embolic stroke - Dementia - Seizure disorder, secondary to stroke - HTN - Syphilis, latent  Disposition: SNF: [facility]  Discharge Condition: stable  Discharge Exam: please see progress note from day of discharge  Brief Hospital Course:  Mark Soto is a 73 y.o. 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 PMH is significant for HTN, anemia, prior AMS. His hospital course is outlined by problem below:  #Acute left PCA stroke, embolic: Seen on MRI on day of admission. He was started on aspirin 325 daily for antiplatelet therapy. Stroke workup completed. Patient was started on a high intensity statin for secondary prevention. TTE was negative. TEE was then completed and showed a large PFO. Bilateral lower extremity dopplers were then ordered given the PFO and embolic stroke; they were negative. Per neuro recs, a loop recorder was then placed by cardiology in order to look for potential arrhythmias that may be contributing.   #Seizure: While in ED, patient had tonic clonic seizure lasting 1 minute. He was started on keppra and continued on this medication at 500mg  BID. An EEG was completed, showing epileptogenic focus involving the left anterior temporal region. He did not have any further seizures during hospitalization. His stroke was felt to be a the likely contributing factor to his seizure given its location and involvement of an area of the hypothalamus.   # Dementia: Wife described  3 year hx of gradually worsening confusion and wandering, most likely dementia. Given MRI findings of "extensive chronic small vessel ischemic changes elsewhere throughout the brain", most likely vascular dementia.  # Syphilis, latent: For workup of dementia above, RPR was obtained and came back reactive with titer of 1:16. Confirmatory FTA-IgG came back reactive. At this time, we consulted ID to weigh in on our options, as we were concerned for neurosyphilis given patient's ataxia and dementia. Per their recommendations, a lumbar puncture was performed. CSF VDRL was negative, ruling out neurosyphilis. He was started on benzathine penicillin IM weekly x3weeks (received first dose on 11/7). Wife was contacted and counseled regarding seeking testing and treatment. She gave verbal agreement.  # HTN: His home antihypertensives were 332R systolic, these meds were continued during hospitalization. Permissive HTN was allowed for a period of time and on the day of discharge, BPs were at goal with systolic BP in the 518A.   # Prediabetes: Stroke workup revealed A1c of 6.3. Discussed diet and exercise lifestyle modifications with patient. Recommend rechecking A1c in 3-6 months.  # BPH: Patient with urinary frequency and urgency and some incontinence, but with clean urine. Likely BPH. Started patient on flomax while inpatient, which he responded well to.  Issues for Follow Up:  - Latent syphilis: needs two more doses IM benzathine penicillin (on 11/14 and 41/66) - Embolic stroke: patient discharged with loop recorder to look for source  Significant Procedures:TEE 06/15/2014, LP 06/15/2014  Significant Labs and Imaging:   Recent Labs Lab 06/13/14 1135  WBC 8.7  HGB 11.9*  HCT 37.1*  PLT 180    Recent Labs Lab 06/13/14 1135  NA 139  K 4.5  CL 102  CO2 26  GLUCOSE 145*  BUN 15  CREATININE 0.90  CALCIUM 9.1   Vit B12: 308 Folate: >20.0 TSH: 0.757 RPR: Reactive, 1:16 titer FTA-IgG:  reactive CSF VDRL: nonreactive HIV: nonreactive  EKG: sinus rhythm Abnormal R wave progression, early transition LVH with secondary repolarization abnormality Now w/ T wave inversions in V3-V6, I, II, and aVL  CT Head:  IMPRESSION: 1. No acute intracranial findings. 2. White matter microvascular disease. 3. Maxillary sinus inflammation.  MRI: IMPRESSION: Areas of acute infarction in the left posterior cerebral artery territory affecting the occipital cortical and subcortical brain. There is some involvement of the hypothalamus adjacent to the atrium of the left lateral ventricle, which could relate to the seizure. No hemorrhage or mass effect. Extensive chronic small vessel ischemic changes elsewhere throughout the brain. No anterior circulation pathology seen. Diminished flow within 1 of the 2 main left PCA branches distally.  EEG:  This EEG is abnormal with mild generalized nonspecific slowing of cerebral activity which can be seen with metabolic as well as degenerative encephalopathies, as well as postictal state. In addition, there were findings consistent with an epileptogenic focus involving the left anterior temporal region.  Results/Tests Pending at Time of Discharge: Loop recorder  Discharge Medications:    Medication List    TAKE these medications        amLODipine 2.5 MG tablet  Commonly known as:  NORVASC  Take 1 tablet (2.5 mg total) by mouth daily.     aspirin 325 MG EC tablet  Take 1 tablet (325 mg total) by mouth daily.     atorvastatin 80 MG tablet  Commonly known as:  LIPITOR  Take 1 tablet (80 mg total) by mouth daily at 6 PM.     hydrochlorothiazide 25 MG tablet  Commonly known as:  HYDRODIURIL  TAKE 1 TABLE  25 MG TOTAL BY MOUTH DAILY  NEEDS FOLLOW UP FOR ADDITION REFILLS     levETIRAcetam 500 MG tablet  Commonly known as:  KEPPRA  Take 1 tablet (500 mg total) by mouth every 12 (twelve) hours.     penicillin g benzathine 1200000 UNIT/2ML Susp  injection  Commonly known as:  BICILLIN LA  Inject 4 mLs (2.4 Million Units total) into the muscle once a week.  Start taking on:  06/23/2014     tamsulosin 0.4 MG Caps capsule  Commonly known as:  FLOMAX  Take 1 capsule (0.4 mg total) by mouth daily after breakfast.        Discharge Instructions: Please refer to Patient Instructions section of EMR for full details.  Patient was counseled important signs and symptoms that should prompt return to medical care, changes in medications, dietary instructions, activity restrictions, and follow up appointments.   Follow-Up Appointments: Follow-up Information    Follow up with [facility]. Schedule an appointment as soon as possible for a visit in 2 months.   Why:  Follow-up of seizure and stroke with Dr. Sherilyn Cooter information:   [address] [place] [address] [phone]      Cardiology to call family and arrange follow up.   Timmothy Euler, MD 06/19/2014, 4:03 PM [place]
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
## 2014-06-14 NOTE — Clinical Social Work Placement (Addendum)
[place] NOTE 06/14/2014  Patient:  Mark Soto, Mark Soto  Account Number:  [id number] Admit date:  06/13/2014  Clinical Social Worker:  Domenica Reamer, CLINICAL SOCIAL WORKER  Date/time:  06/14/2014 05:40 PM  Clinical Social Work is seeking post-discharge placement for this patient at the following level of care:   SKILLED NURSING   (*CSW will update this form in Epic as items are completed)   06/14/2014  Patient/family provided with [place] Department of Clinical Social Work's list of facilities offering this level of care within the geographic area requested by the patient (or if unable, by the patient's family).  06/14/2014  Patient/family informed of their freedom to choose among providers that offer the needed level of care, that participate in Medicare, Medicaid or managed care program needed by the patient, have an available bed and are willing to accept the patient.  06/14/2014  Patient/family informed of [facility]' ownership interest in [facility], as well as of the fact that they are under no obligation to receive care at this facility.  PASARR submitted to EDS on 06/14/2014 PASARR number received on 06/14/2014  FL2 transmitted to all facilities in geographic area requested by pt/family on  06/14/2014 FL2 transmitted to all facilities within larger geographic area on   Patient informed that his/her managed care company has contracts with or will negotiate with  certain facilities, including the following:     Patient/family informed of bed offers received:   06/16/2014 Patient chooses bed at  [facility] Physician recommends and patient chooses bed at    Patient to be transferred to  on   Patient to be transferred to facility by  Patient and family notified of transfer on  Name of family member notified:    The following physician request were entered in Epic:   Additional Comments:   Domenica Reamer, [place] Social Worker [phone]
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
## 2014-06-14 NOTE — Evaluation (Signed)
Clinical/Bedside Swallow Evaluation Patient Details  Name: Mark Soto MRN: [id number] Date of Birth: March 07, 1941  Today's Date: 06/14/2014 Time: 4193-7902 SLP Time Calculation (min): 10 min  Past Medical History:  Past Medical History  Diagnosis Date  . Hypertension   . Tinnitus     Right Ear  . Anemia   . Abdominal wall hernia   . Personal history of colonic adenomas 04/14/2013  . 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 Seizures 06/13/2014   Past Surgical History:  Past Surgical History  Procedure Laterality Date  . None     HPI:  Mark Soto is a 73 y.o. 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 PMH is significant for HTN, anemia, prior AMS.   Assessment / Plan / Recommendation Clinical Impression  Pt's oropharyngeal swallow appears [facility] for timing, strength, and coordination. He does not know where his bottom dentures are at this time, and therefore prefers softer textures. Pt does have a mild risk of aspiration given current cognitive status and impulsivity (see SLP cognitive evaluation for more details), therefore will f/u briefly for tolerance.    Aspiration Risk  Mild    Diet Recommendation Dysphagia 3 (Mechanical Soft);Thin liquid   Liquid Administration via: Cup;Straw Medication Administration: Whole meds with liquid Supervision: Patient able to self feed;Intermittent supervision to cue for compensatory strategies Compensations: Slow rate;Small sips/bites Postural Changes and/or Swallow Maneuvers: Seated upright 90 degrees    Other  Recommendations Oral Care Recommendations: Oral care BID   Follow Up Recommendations   (none anticipated for swallowing)    Frequency and Duration min 1 x/week  1 week   Pertinent Vitals/Pain n/a    SLP Swallow Goals     Swallow Study Prior Functional Status       General Date of Onset: 06/13/14 HPI: Mark Soto is a 73 y.o. male presenting with AMS,  confusion, and seizure in ED in setting of acute left PCA infarct on MRI. PMH is significant for HTN, anemia, prior AMS. Type of Study: Bedside swallow evaluation Previous Swallow Assessment: none in chart Diet Prior to this Study: NPO Temperature Spikes Noted: No Respiratory Status: Room air History of Recent Intubation: No Behavior/Cognition: Alert;Cooperative;Pleasant mood;Requires cueing;Distractible Oral Cavity - Dentition: Dentures, top;Dentures, not available (bottom dentures not in room, pt not sure where they are) Self-Feeding Abilities: Able to feed self Patient Positioning: Upright in chair Baseline Vocal Quality: Clear Volitional Cough: Strong Volitional Swallow: Able to elicit    Oral/Motor/Sensory Function Overall Oral Motor/Sensory Function: Impaired Labial ROM: Reduced right Labial Symmetry: Abnormal symmetry right Lingual ROM: Within Functional Limits Lingual Symmetry: Within Functional Limits Lingual Strength: Within Functional Limits Facial ROM: Reduced right Facial Symmetry: Right droop;Right drooping eyelid Facial Strength: Reduced Velum: Within Functional Limits   Ice Chips Ice chips: Not tested   Thin Liquid Thin Liquid: Impaired Presentation: Cup;Self Fed;Straw Pharyngeal  Phase Impairments: Throat Clearing - Delayed Other Comments: throat clearing x1 after several consecutive large sips    Nectar Thick Nectar Thick Liquid: Not tested   Honey Thick Honey Thick Liquid: Not tested   Puree Puree: Within functional limits Presentation: Self Fed;Spoon   Solid   GO    Solid: Within functional limits Presentation: Self Fed        Germain Osgood, M.A. CCC-SLP [phone]  Germain Osgood 06/14/2014,2:34 PM

## 2014-06-14 NOTE — Evaluation (Signed)
Speech Language Pathology Evaluation Patient Details Name: Mark Soto MRN: [id number] DOB: [birth year] Today's Date: 06/14/2014 Time: 5631-4970 SLP Time Calculation (min): 20 min  Problem List:  Patient Active Problem List   Diagnosis Date Noted  . Seizure 06/13/2014  . Altered mental status   . Dementia   . Personal history of colonic adenomas 04/14/2013   Past Medical History:  Past Medical History  Diagnosis Date  . Hypertension   . Tinnitus     Right Ear  . Anemia   . Abdominal wall hernia   . Personal history of colonic adenomas 04/14/2013  . 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 Seizures 06/13/2014   Past Surgical History:  Past Surgical History  Procedure Laterality Date  . None     HPI:  Mark Soto is a 73 y.o. 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 PMH is significant for HTN, anemia, prior AMS.   Assessment / Plan / Recommendation Clinical Impression  Pt is disoriented to location with decreased sustained attention and working memory that makes it difficult for him to recall important information regarding daily events and safety. He has reduced awareness of his impairments and was impulsive in getting out of the chair to use the bathroom. He requires Min cues from SLP to accurately follow one-step commands throughout functional tasks. Patient will benefit from skilled SLP services targeting the above.    SLP Assessment  Patient needs continued Speech Lanaguage Pathology Services    Follow Up Recommendations  Skilled Nursing facility;Home health SLP;24 hour supervision/assistance    Frequency and Duration min 2x/week  2 weeks   Pertinent Vitals/Pain Pain Assessment: No/denies pain   SLP Goals  Patient/Family Stated Goal: wants to get something to eat/drink Potential to Achieve Goals: Fair Potential Considerations: Ability to learn/carryover information;Previous level of function  SLP  Evaluation Prior Functioning  Cognitive/Linguistic Baseline: Baseline deficits Baseline deficit details: per MD notes, patient has had bouts of worsening confusion over the last few years   Cognition  Overall Cognitive Status: Impaired/Different from baseline Arousal/Alertness: Awake/alert Orientation Level: Oriented to person;Disoriented to place;Oriented to situation Attention: Sustained Sustained Attention: Impaired Sustained Attention Impairment: Functional basic;Verbal basic Memory: Impaired Memory Impairment: Decreased recall of new information Awareness: Impaired Awareness Impairment: Intellectual impairment;Emergent impairment;Anticipatory impairment Problem Solving: Impaired Problem Solving Impairment: Verbal basic;Functional basic Behaviors: Impulsive Safety/Judgment: Impaired Comments: patient impulsive getting out of chair to use the urinal    Comprehension  Auditory Comprehension Overall Auditory Comprehension: Impaired Commands: Impaired One Step Basic Commands: 75-100% accurate Conversation: Simple Interfering Components: Attention;Working Curator: Not tested Reading Comprehension Reading Status: Not tested    Expression Expression Primary Mode of Expression: Verbal Verbal Expression Overall Verbal Expression: Impaired Other Verbal Expression Comments: pt with repetetive language, anomia in conversation, suspect due to cognitive impairments Written Expression Written Expression: Not tested   Oral / Motor Oral Motor/Sensory Function Overall Oral Motor/Sensory Function: Impaired Labial ROM: Reduced right Labial Symmetry: Abnormal symmetry right Lingual ROM: Within Functional Limits Lingual Symmetry: Within Functional Limits Lingual Strength: Within Functional Limits Facial ROM: Reduced right Facial Symmetry: Right droop;Right drooping eyelid Facial Strength: Reduced Velum: Within Functional Limits Motor  Speech Overall Motor Speech: Appears within functional limits for tasks assessed   GO      Germain Osgood, M.A. CCC-SLP [phone]  Germain Osgood 06/14/2014, 2:46 PM

## 2014-06-15 ENCOUNTER — Encounter (HOSPITAL_COMMUNITY): Payer: Self-pay | Admitting: *Deleted

## 2014-06-15 ENCOUNTER — Encounter (HOSPITAL_COMMUNITY)
Admission: EM | Disposition: A | Discharge status: Skilled Nursing Facility | Payer: Self-pay | Source: Home / Self Care | Attending: Family Medicine

## 2014-06-15 DIAGNOSIS — E785 Hyperlipidemia, unspecified: Secondary | ICD-10-CM

## 2014-06-15 DIAGNOSIS — Q2112 Patent foramen ovale: Secondary | ICD-10-CM | POA: Insufficient documentation

## 2014-06-15 DIAGNOSIS — Q211 Atrial septal defect: Secondary | ICD-10-CM | POA: Insufficient documentation

## 2014-06-15 DIAGNOSIS — F0390 Unspecified dementia without behavioral disturbance: Secondary | ICD-10-CM

## 2014-06-15 DIAGNOSIS — I639 Cerebral infarction, unspecified: Secondary | ICD-10-CM

## 2014-06-15 DIAGNOSIS — A539 Syphilis, unspecified: Secondary | ICD-10-CM

## 2014-06-15 DIAGNOSIS — I6350 Cerebral infarction due to unspecified occlusion or stenosis of unspecified cerebral artery: Secondary | ICD-10-CM

## 2014-06-15 HISTORY — PX: TEE WITHOUT CARDIOVERSION: SHX5443

## 2014-06-15 LAB — CSF CELL COUNT WITH DIFFERENTIAL
RBC Count, CSF: 0 /mm3
TUBE #: 3
WBC, CSF: 1 /mm3 (ref 0–5)

## 2014-06-15 SURGERY — ECHOCARDIOGRAM, TRANSESOPHAGEAL
Anesthesia: Moderate Sedation

## 2014-06-15 MED ORDER — TAMSULOSIN HCL 0.4 MG PO CAPS
0.4000 mg | ORAL_CAPSULE | Freq: Every day | ORAL | Status: DC
  Administered 2014-06-16 – 2014-06-19 (×4): 0.4 mg via ORAL
  Filled 2014-06-15 (×5): qty 1

## 2014-06-15 MED ORDER — FENTANYL CITRATE 0.05 MG/ML IJ SOLN
INTRAMUSCULAR | Status: DC | PRN
  Administered 2014-06-15 (×3): 12.5 ug via INTRAVENOUS
  Administered 2014-06-15: 25 ug via INTRAVENOUS

## 2014-06-15 MED ORDER — MIDAZOLAM HCL 5 MG/ML IJ SOLN
INTRAMUSCULAR | Status: AC
  Filled 2014-06-15: qty 2

## 2014-06-15 MED ORDER — LIDOCAINE VISCOUS 2 % MT SOLN
OROMUCOSAL | Status: DC | PRN
Start: 2014-06-15 — End: 2014-06-15
  Administered 2014-06-15: 10 mL via OROMUCOSAL

## 2014-06-15 MED ORDER — LIDOCAINE VISCOUS 2 % MT SOLN
OROMUCOSAL | Status: AC
  Filled 2014-06-15: qty 15

## 2014-06-15 MED ORDER — DIPHENHYDRAMINE HCL 50 MG/ML IJ SOLN
INTRAMUSCULAR | Status: AC
  Filled 2014-06-15: qty 1

## 2014-06-15 MED ORDER — FENTANYL CITRATE 0.05 MG/ML IJ SOLN
INTRAMUSCULAR | Status: AC
  Filled 2014-06-15: qty 2

## 2014-06-15 MED ORDER — MIDAZOLAM HCL 10 MG/2ML IJ SOLN
INTRAMUSCULAR | Status: DC | PRN
  Administered 2014-06-15 (×3): 2 mg via INTRAVENOUS
  Administered 2014-06-15: 1 mg via INTRAVENOUS

## 2014-06-15 NOTE — Progress Notes (Signed)
*  PRELIMINARY RESULTS* Echocardiogram Echocardiogram Transesophageal has been performed.  Leavy Cella 06/15/2014, 11:12 AM

## 2014-06-15 NOTE — Progress Notes (Signed)
Received from endo post TEE, sedated , respond to verbal stimuli but goes back to sleep easily. VSS. Will continue to monitor.

## 2014-06-15 NOTE — Procedures (Signed)
LUMBAR PUNCTURE: Patient given informed consent, signed copy is scanned to the chart. Appropriate time out taken. Area prepped and draped in usual sterile fashion. Landmarks identified. Four cc of 1% lidocaine without epinephrine injected for local anesthesia. Spinal needle was inserted into the space between L3-L4. Trocar was removed and when no fluid noted, trocar was replaced and the spinal needle apparatus was advanced further into the space. When the trocar was removed, clear CSF was seen to drip from the needle and it  was collected in sterile fashion. All equipment was removed. No blood loss noted. Patient tolerated the procedure well without any complications. A bandaid was applied to the puncture site and the samples sent to the lab.

## 2014-06-15 NOTE — Progress Notes (Addendum)
Family Medicine Teaching Service Daily Progress Note Intern Pager: [phone]  Patient name: Mark Soto Medical record number: [id number] Date of birth: 09/30/1940 Age: 73 y.o. Gender: male  Primary Care Provider: Reginia Forts, MD Consultants: neuro Code Status: FULL  Pt Overview and Major Events to Date:  06/13/14: patient admitted with acutely worsening AMS in setting of 3 year decline in memory and functioning at home; had seizure in ED, then found to have acute PCA infarct on MRI 06/14/14: patient continued on keppra for seizure management; stroke workup done  Assessment and Plan: Mark Soto is a 73 y.o. 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 PMH is significant for HTN, anemia, prior AMS.  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 No hx of prior stroke. - stroke workup: TEE and carotid dopplers today, A1c and lipids done - aspirin 325mg   - atorvastatin 80mg  for secondary stroke prevention - PT/OT recs: 24hr supervision/snf - neurology consulting, appreciate recs  #Seizure: patient with apparent tonic clonic seizure lasting 1 minute. No hx of seizures. No significant electrolyte abnormalities. 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 - on Keppra 500mg  IV BID, per neuro recs - EEG completed, showed epileptogenic focus - MRI completed, PCA stroke involving hypothalamus could have caused seizure per radiology - Neuro following, appreciate recs  # Dementia: Patient with 3 year hx of gradually worsening confusion, likely dementia. Acute PCA stroke likely caused his acute worsening.  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 On exam, patient has ataxia, no argyll robertson pupils. Given dementia and ataxia,  concerned for neurosyphilis.  - ID consulted, rec LP with CSF VDRL and cell count, obtain this afternoon  # HTN: hx of hypertension, home meds include amlodipine and HCTZ. BPs currently 226J-335K systolic. One BP of 203/75. Permissive HTN for at least 24 hrs post stroke.  - continue amlodipine 2.5mg  daily and HCTZ 25 mg daily  # Pre-diabetes: A1c of 6.3 here.  - will recommend diet and exercise lifestyle modifications - f/u A1c in clinic in 3-6 months  FEN/GI: dysphagia diet, SLIV Prophylaxis: subq heparin  Disposition: SW finding SNF placement currently  Subjective:  Doing well, no complaints. Said his test this morning went fine. His sister and daughter visited today. Sitter is in room now and has been since he returned to floor from TEE.  Objective: Temp:  [97.7 F (36.5 C)-98.1 F (36.7 C)] 97.7 F (36.5 C) (11/06 0807) Pulse Rate:  [77-79] 79 (11/06 0807) Resp:  [18-20] 18 (11/06 0807) BP: (156-203)/(75-78) 203/75 mmHg (11/06 0807) SpO2:  [98 %-99 %] 99 % (11/06 0807) Physical Exam: General: NAD, lying in bed and dozing Cardiovascular: RRR, no M/R/G Respiratory: normal wob, CTAB Abdomen: obese, NABS, nontender, nondistended, no masses or HSM Extremities: trace edema bilaterally, warm and well-perfused Neuro: alert, oriented to person and place but not to year, month, or day; PERRLA, EOMI, ptosis of right eye, face symmetric, facial sensation intact to light touch bilaterally, palatal rise symmetric, bilateral shoulder shrug, tongue midline, strength 5/5 in bilateral upper and lower extremities and grip, sensation to light touch intact in bilateral upper and lower extremities, gait unstable, biceps, triceps, patellar reflexes 2+ bilaterally, negative babinski bilaterally   Laboratory:  Recent Labs Lab  06/12/14 1107 06/13/14 1135  WBC 8.0 8.7  HGB 12.6* 11.9*  HCT 40.2* 37.1*  PLT  --  180    Recent Labs Lab 06/12/14 1104 06/13/14 1135  NA 140 139  K 4.8 4.5   CL 103 102  CO2 27 26  BUN 16 15  CREATININE 1.02 0.90  CALCIUM 9.2 9.1  PROT 7.5  --   BILITOT 0.5  --   ALKPHOS 78  --   ALT 11  --   AST 15  --   GLUCOSE 113* 145*   Vit B12: 308 Folate: >20.0 TSH: 0.757 HIV: nonreactive A1c: 6.3 RPR: Reactive, 1:16 titer FTA IgG: reactive  Lipid Panel     Component Value Date/Time   CHOL 171 06/14/2014 1030   TRIG 115 06/14/2014 1030   HDL 54 06/14/2014 1030   CHOLHDL 3.2 06/14/2014 1030   VLDL 23 06/14/2014 1030   LDLCALC 94 06/14/2014 1030    Imaging/Diagnostic Tests: EKG: sinus rhythm Abnormal R wave progression, early transition LVH with secondary repolarization abnormality Now w/ T wave inversions in V3-V6, I, II, and aVL  CT Head:  IMPRESSION: 1. No acute intracranial findings. 2. White matter microvascular disease. 3. Maxillary sinus inflammation.  MRI: IMPRESSION: Areas of acute infarction in the left posterior cerebral artery territory affecting the occipital cortical and subcortical brain. There is some involvement of the hypothalamus adjacent to the atrium of the left lateral ventricle, which could relate to the seizure. No hemorrhage or mass effect. Extensive chronic small vessel ischemic changes elsewhere throughout the brain. No anterior circulation pathology seen. Diminished flow within 1 of the 2 main left PCA branches distally.   Montel Clock, Med Student 06/15/2014, 8:13 AM [place] Intern pager: [phone], text pages welcome  I agree with the medical student note above and have edited it as necessary. I formulated the plan and performed my own physical exam which are documented above.  Beverlyn Roux, MD, MPH [facility] PGY-1 06/15/2014 5:49 PM

## 2014-06-15 NOTE — Interval H&P Note (Signed)
History and Physical Interval Note:  06/15/2014 8:25 AM  Mark Soto  has presented today for surgery, with the diagnosis of STROKE  The various methods of treatment have been discussed with the patient and family. After consideration of risks, benefits and other options for treatment, the patient has consented to  Procedure(s): TRANSESOPHAGEAL ECHOCARDIOGRAM (TEE) (N/A) as a surgical intervention .  The patient's history has been reviewed, patient examined, no change in status, stable for surgery.  I have reviewed the patient's chart and labs.  Questions were answered to the patient's satisfaction.     Dorris Carnes

## 2014-06-15 NOTE — Op Note (Signed)
TEE  LA, LAA without masses.  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  Mild TR  AV mildy thickened.  No AI  MV normal.    PV normal  LVEF normal     Full report to follow

## 2014-06-15 NOTE — Progress Notes (Signed)
Physical Therapy Treatment Patient Details Name: Mark Soto MRN: [id number] DOB: [birth year] Today's Date: 06/15/2014    History of Present Illness Patient is a 73 y/o male presenting with AMS, confusion and progressing memory decline. While in ED pt had a witnessed seizure. CT head shows no acute infarct. PMH is significant for HTN, anemia, prior AMS. Per MD note: For the past 3 years, patient has been getting increasingly confused, at times wandering off. He also intermittently complains of dizziness. Pt was [facility]  and the staff there noted him to be more confused than normal so they called EMS who brought him to the ED. MRI brain-Areas of acute infarction in the left posterior cerebral artery.     PT Comments    Patient making progress with balance with ambulation.  Agree with need for SNF.  Follow Up Recommendations  SNF;Supervision/Assistance - 24 hour     Equipment Recommendations  Other (comment) (TBD)    Recommendations for Other Services       Precautions / Restrictions Precautions Precautions: Fall Restrictions Weight Bearing Restrictions: No    Mobility  Bed Mobility Overal bed mobility: Needs Assistance Bed Mobility: Supine to Sit;Sit to Supine     Supine to sit: Min assist Sit to supine: Min assist   General bed mobility comments: Verbal and tactile cues to move to EOB - no physical assist needed.  Once upright, patient with good sitting balance.  Transfers Overall transfer level: Needs assistance Equipment used: None   Sit to Stand: Min guard         General transfer comment: Min guard for safety due to impulsiveness.  Ambulation/Gait Ambulation/Gait assistance: Min assist Ambulation Distance (Feet): 180 Feet Assistive device: None Gait Pattern/deviations:  (Drifted toward left when noticed people on left.) Gait velocity: slow Gait velocity interpretation: Below normal speed for  age/gender General Gait Details: Patient with only slightly unsteady gait today. Did move toward left side when he saw others in the hallway.  Difficulty finding daughter standing to his right.  Assist x1 to redirect patient toward his right to return to room.   Stairs            Wheelchair Mobility    Modified Rankin (Stroke Patients Only) Modified Rankin (Stroke Patients Only) Pre-Morbid Rankin Score: No symptoms Modified Rankin: Moderately severe disability     Balance           Standing balance support: No upper extremity supported Standing balance-Leahy Scale: Good                      Cognition Arousal/Alertness: Awake/alert Behavior During Therapy: Impulsive;Restless Overall Cognitive Status: Impaired/Different from baseline Area of Impairment: Orientation;Memory;Following commands;Safety/judgement;Problem solving Orientation Level: Disoriented to;Time;Situation   Memory: Decreased short-term memory Following Commands: Follows one step commands with increased time;Follows multi-step commands inconsistently Safety/Judgement: Decreased awareness of deficits;Decreased awareness of safety   Problem Solving: Slow processing;Difficulty sequencing;Requires verbal cues General Comments: Instructed patient to sit on EOB toward PT.  Patient moved to long sitting and then became "stuck".  Didn't know what to do next.    Exercises      General Comments        Pertinent Vitals/Pain Pain Assessment: No/denies pain    Home Living                      Prior Function  PT Goals (current goals can now be found in the care plan section) Progress towards PT goals: Progressing toward goals    Frequency  Min 3X/week    PT Plan Current plan remains appropriate    Co-evaluation             End of Session Equipment Utilized During Treatment: Gait belt Activity Tolerance: Patient tolerated treatment well Patient left: in bed;with  call bell/phone within reach;with nursing/sitter in room;with family/visitor present     Time: 0459-9774 PT Time Calculation (min): 24 min  Charges:  $Gait Training: 23-37 mins                    G Codes:      Despina Pole 07-12-2014, 6:45 PM Carita Pian. Sanjuana Kava, [place] Pager [phone]

## 2014-06-15 NOTE — Progress Notes (Signed)
OT Cancellation Note  Patient Details Name: Tiant Peixoto MRN: [id number] DOB: 1940-12-26   Cancelled Treatment:    Reason Eval/Treat Not Completed: Patient at procedure or test/ unavailable. OT will follow up as available to complete evaluation.   Villa Herb M   Cyndie Chime, OTR/L Occupational Therapist [phone] (pager)  06/15/2014, 4:34 PM
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
## 2014-06-15 NOTE — Progress Notes (Signed)
    [facility]     Reason for Consult: positive RPR    Referring Physician: Dr. Nori Riis  Principal Problem:   Cerebral embolism with cerebral infarction Active Problems:   Seizure   Stroke   . amLODipine  2.5 mg Oral Daily  . aspirin EC  325 mg Oral Daily  . atorvastatin  80 mg Oral q1800  . heparin  5,000 Units Subcutaneous 3 times per day  . hydrochlorothiazide  25 mg Oral Daily  . levETIRAcetam  500 mg Intravenous Q12H  . 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   HIV negative.    Antibiotics: none  HPI: Mark Soto is a 73 y.o. male with hsitory of signs of progressive dementia, history of syphilis over a decade ago and came in with a stroke.  Was more confused.  Had a seizure and MRI with left posterior cerebral artery area acute infarct.     Review of Systems: A comprehensive review of systems was negative.  Past Medical History  Diagnosis Date  . Hypertension   . Tinnitus     Right Ear  . Anemia   . Abdominal wall hernia   . Personal history of colonic adenomas 04/14/2013  . 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 Seizures 06/13/2014    History  Substance Use Topics  . Smoking status: Never Smoker   . Smokeless tobacco: Never Used  . Alcohol Use: No    Family History  Problem Relation Age of Onset  . Breast cancer Mother   . COPD Father   . Hypertension Sister   . Hypertension Brother   . Hypertension Sister   . Liver cancer Mother   . Lung cancer Mother   . Colon cancer Neg Hx   . Colon polyps Neg Hx   . Stomach cancer Neg Hx    No Known Allergies  OBJECTIVE: Blood pressure 140/76,  pulse 65, temperature 98.6 F (37 C), temperature source Oral, resp. rate 18, height 6' (1.829 m), weight 238 lb 9.6 oz (108.228 kg), SpO2 97 %. General: awake, alert, nad Skin: no rashes Lungs: CTA B Cor: RRR Abdomen: soft, nt, nd Ext: no edema  Microbiology: No results found for this or any previous visit (from the past 240 hour(s)).  Scharlene Gloss, [facility] Infect[facility] [place] [email or web address] [phone] pager  [phone] cell 06/15/2014, 12:48 PM

## 2014-06-15 NOTE — Progress Notes (Addendum)
SLP Cancellation Note  Patient Details Name: Mark Soto MRN: [id number] DOB: March 18, 1941   Cancelled treatment:       Reason Eval/Treat Not Completed: Fatigue/lethargy limiting ability to participate. Pt not yet fully alert after procedure this morning. Will return as schedule allows for f/u cognitive and dysphagia treatment.   SLP returned this afternoon, and patient is now undergoing procedure in room. Will continue efforts.    Germain Osgood, M.A. CCC-SLP [phone]  Germain Osgood 06/15/2014, 12:06 PM

## 2014-06-16 DIAGNOSIS — I6340 Cerebral infarction due to embolism of unspecified cerebral artery: Secondary | ICD-10-CM

## 2014-06-16 DIAGNOSIS — I639 Cerebral infarction, unspecified: Secondary | ICD-10-CM

## 2014-06-16 DIAGNOSIS — A528 Late syphilis, latent: Secondary | ICD-10-CM | POA: Insufficient documentation

## 2014-06-16 MED ORDER — PENICILLIN G BENZATHINE 1200000 UNIT/2ML IM SUSP
2.4000 10*6.[IU] | INTRAMUSCULAR | Status: DC
  Administered 2014-06-16: 2.4 10*6.[IU] via INTRAMUSCULAR
  Filled 2014-06-16 (×2): qty 4

## 2014-06-16 NOTE — Progress Notes (Signed)
OT Cancellation Note  Patient Details Name: Mark Soto MRN: [id number] DOB: Feb 26, 1941   Cancelled Treatment:    Reason Eval/Treat Not Completed: OT screened. Per notes, pt has accepted bed off at SNF. Will defer all further OT needs to next venue of care.  Benito Mccreedy OTR/L [phone] 06/16/2014, 6:31 PM

## 2014-06-16 NOTE — Progress Notes (Signed)
CSW Intern provided bed offers to pt and he chose [name] Pt's sister, Juanetta Snow, was present and supportive. CSW Intern confirmed with Crystal at [facility] that pt accepted bed offer and Crystal will contact Frankie to sign paperwork. Unit CSW will f/u.  Cosigned by j Reef Achterberg lcsw

## 2014-06-16 NOTE — Progress Notes (Signed)
VASCULAR LAB PRELIMINARY  PRELIMINARY  PRELIMINARY  PRELIMINARY  Carotid Dopplers completed.    Preliminary report:  1-39% ICA stenosis.  Vertebral artery flow is antegrade.   Tanya Crothers, RVT 06/16/2014, 2:58 PM

## 2014-06-16 NOTE — Progress Notes (Signed)
Family Medicine Teaching Service Daily Progress Note Intern Pager: [phone]  Patient name: Mark Soto Medical record number: [id number] Date of birth: 10-20-1940 Age: 73 y.o. Gender: male  Primary Care Provider: Reginia Forts, MD Consultants: neuro Code Status: FULL  Pt Overview and Major Events to Date:  06/13/14: patient admitted with acutely worsening AMS in setting of 3 year decline in memory and functioning at home; had seizure in ED, then found to have acute PCA infarct on MRI 06/14/14: patient continued on keppra for seizure management; stroke workup done  Assessment and Plan: Mark Soto is a 73 y.o. 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 PMH is significant for HTN, anemia, prior AMS.  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 No hx of prior stroke. - stroke workup: TEE with large PFO, carotid and LE dopplers today, A1c and lipids done - aspirin 325mg   - atorvastatin 80mg  for secondary stroke prevention - PT/OT recs: 24hr supervision/snf - neurology consulting, appreciate recs  #Seizure: patient with apparent tonic clonic seizure lasting 1 minute. No hx of seizures. No significant electrolyte abnormalities. 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 - on Keppra 500mg  IV BID, per neuro recs - EEG completed, showed epileptogenic focus - MRI completed, PCA stroke involving hypothalamus could have caused seizure per radiology - Neuro following, appreciate recs  # Dementia: Patient with 3 year hx of gradually worsening confusion, likely dementia. Acute PCA stroke likely caused his acute worsening.  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 On exam, patient has ataxia, no argyll robertson pupils.  Given dementia and ataxia, concerned for neurosyphilis.  - ID consulted, appreciate recs - CSF cell counts normal and VDRL pending  # HTN: hx of hypertension, home meds include amlodipine and HCTZ. BPs currently 485I-627O systolic. Can tighten control as >24hrs post stroke  - continue amlodipine 2.5mg  daily and HCTZ 25 mg daily  # Pre-diabetes: A1c of 6.3 here.  - will recommend diet and exercise lifestyle modifications - f/u A1c in clinic in 3-6 months  FEN/GI: dysphagia diet, SLIV Prophylaxis: subq heparin  Disposition: SW finding SNF placement currently  Subjective:  Doing well, no complaints. Reports he is very grateful that he was in the hospital when he had his seizure and that we are taking good care of him.   Objective: Temp:  [97.3 F (36.3 C)-98.6 F (37 C)] 97.3 F (36.3 C) (11/07 0637) Pulse Rate:  [65-84] 83 (11/07 0637) Resp:  [16-22] 16 (11/07 0637) BP: (140-178)/(69-92) 151/77 mmHg (11/07 0637) SpO2:  [94 %-100 %] 95 % (11/07 3500) Physical Exam: General: NAD, lying in bed and dozing. Extremely agreeable Cardiovascular: RRR, no M/R/G Respiratory: normal wob, CTAB Abdomen: obese, NABS, nontender, nondistended, no masses or HSM Extremities: trace edema bilaterally, warm and well-perfused Neuro: alert, oriented to person and place but not time, no focal deficits   Laboratory:  Recent Labs Lab 06/12/14 1107 06/13/14 1135  WBC 8.0 8.7  HGB 12.6* 11.9*  HCT 40.2* 37.1*  PLT  --  180    Recent Labs Lab 06/12/14 1104 06/13/14 1135  NA 140 139  K 4.8 4.5  CL 103 102  CO2 27 26  BUN 16 15  CREATININE 1.02 0.90  CALCIUM 9.2 9.1  PROT 7.5  --   BILITOT 0.5  --   ALKPHOS 78  --   ALT 11  --   AST 15  --   GLUCOSE 113* 145*   Vit B12: 308 Folate: >20.0 TSH: 0.757 HIV: nonreactive A1c: 6.3 RPR: Reactive, 1:16 titer FTA IgG: reactive  Lipid Panel     Component Value Date/Time   CHOL 171 06/14/2014 1030   TRIG 115 06/14/2014 1030   HDL  54 06/14/2014 1030   CHOLHDL 3.2 06/14/2014 1030   VLDL 23 06/14/2014 1030   LDLCALC 94 06/14/2014 1030    06/15/2014 16:41  RBC Count, CSF 0  WBC, CSF 1  Lymphs, CSF RARE  Monocyte-Macrophage-Spinal Fluid RARE  Other Cells, CSF TOO FEW TO COUNT,...  Appearance, CSF CLEAR  Color, CSF COLORLESS   Imaging/Diagnostic Tests: EKG: sinus rhythm Abnormal R wave progression, early transition LVH with secondary repolarization abnormality Now w/ T wave inversions in V3-V6, I, II, and aVL  CT Head:  IMPRESSION: 1. No acute intracranial findings. 2. White matter microvascular disease. 3. Maxillary sinus inflammation.  MRI: IMPRESSION: Areas of acute infarction in the left posterior cerebral artery territory affecting the occipital cortical and subcortical brain. There is some involvement of the hypothalamus adjacent to the atrium of the left lateral ventricle, which could relate to the seizure. No hemorrhage or mass effect. Extensive chronic small vessel ischemic changes elsewhere throughout the brain. No anterior circulation pathology seen. Diminished flow within 1 of the 2 main left PCA branches distally.   Beverlyn Roux, MD, MPH [facility] PGY-2 06/16/2014 9:04 AM

## 2014-06-16 NOTE — Progress Notes (Signed)
Noted large PFO on TEE; LE dopplers pending Ongoing workup for neurosyphillis per ID/neuro  Please call if loop recorder still indicated after current ongoing workup and we will consult at that time.   Cristopher Peru, M.D.
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
## 2014-06-16 NOTE — Progress Notes (Signed)
    Primary cardiologist: Dr. Dorris Carnes  Seen for followup: PFO by TEE  Subjective:    Awake and alert this morning. Answers questions appropriately. No complaints of pain.  Objective:   Temp:  [97.3 F (36.3 C)-98.6 F (37 C)] 97.3 F (36.3 C) (11/07 0637) Pulse Rate:  [65-84] 83 (11/07 0637) Resp:  [16-22] 16 (11/07 0637) BP: (140-203)/(69-92) 151/77 mmHg (11/07 0637) SpO2:  [94 %-100 %] 95 % (11/07 0637) Last BM Date: 06/15/14  Filed Weights   06/13/14 1741  Weight: 238 lb 9.6 oz (108.228 kg)    Intake/Output Summary (Last 24 hours) at 06/16/14 0722 Last data filed at 06/16/14 0639  Gross per 24 hour  Intake    590 ml  Output    900 ml  Net   -310 ml    Telemetry: Sinus rhythm with PVCs  Exam:  General: No distress.  Lungs: Clear, nonlabored.  Cardiac: RRR with soft systolic murmur.  Extremities: No edema.   Lab Results:  Basic Metabolic Panel:  Recent Labs Lab 06/12/14 1104 06/13/14 1135  NA 140 139  K 4.8 4.5  CL 103 102  CO2 27 26  GLUCOSE 113* 145*  BUN 16 15  CREATININE 1.02 0.90  CALCIUM 9.2 9.1    Liver Function Tests:  Recent Labs Lab 06/12/14 1104  AST 15  ALT 11  ALKPHOS 78  BILITOT 0.5  PROT 7.5  ALBUMIN 4.3    CBC:  Recent Labs Lab 06/12/14 1107 06/13/14 1135  WBC 8.0 8.7  HGB 12.6* 11.9*  HCT 40.2* 37.1*  MCV 75.2* 73.8*  PLT  --  180    Cardiac Enzymes:  Recent Labs Lab 06/13/14 1135  TROPONINI <0.30    TEE 06/15/2014: Left ventricle: LVEF is normal.  ------------------------------------------------------------------- Aortic valve: AV is mildly thickened. No AI.  ------------------------------------------------------------------- Mitral valve: MV is normal. Trace MR.  ------------------------------------------------------------------- Left atrium: LA, LA appendage without. masses.  ------------------------------------------------------------------- Atrial septum: Large PFO as tested  by color doppler and with injection of agitated saline with bubbles seen in L sided chambers.  ------------------------------------------------------------------- Pulmonic valve:  PV is normal.  ------------------------------------------------------------------- Tricuspid valve: TV is normal MIld TR.  ------------------------------------------------------------------- Prepared and Electronically Authenticated by  Dorris Carnes, M.D. 2015-11-06T15:54:38   Medications:   Scheduled Medications: . amLODipine  2.5 mg Oral Daily  . aspirin EC  325 mg Oral Daily  . atorvastatin  80 mg Oral q1800  . heparin  5,000 Units Subcutaneous 3 times per day  . hydrochlorothiazide  25 mg Oral Daily  . levETIRAcetam  500 mg Intravenous Q12H  . tamsulosin  0.4 mg Oral QPC breakfast      PRN Medications:  acetaminophen **OR** acetaminophen   Assessment:   1. PFO by TEE with relatively large degree of right to left shunting.  2. Left PCA infarct, embolic. Further workup underway. Lower extremity Dopplers pending to assess for DVT. Also seizure and dementia workup with positive RPR. Telemetry without atrial arrhythmias.   Plan/Discussion:    If no other definitive source of stroke found, question whether this represents failure of aspirin (since he was already on it when he came in) and since had new stroke, would he be candidate for anticoagulation or PFO closure?   Satira Sark, M.D., F.A.C.C.

## 2014-06-16 NOTE — Progress Notes (Signed)
Patient ID: Mark Soto, male   DOB: 11-Oct-1940, 73 y.o.   MRN: [id number]         [facility]    Date of Admission:  06/13/2014     Principal Problem:   Cerebral embolism with cerebral infarction Active Problems:   Seizure   Stroke   Syphilis in male   HLD (hyperlipidemia)   PFO (patent foramen ovale)   . amLODipine  2.5 mg Oral Daily  . aspirin EC  325 mg Oral Daily  . atorvastatin  80 mg Oral q1800  . heparin  5,000 Units Subcutaneous 3 times per day  . hydrochlorothiazide  25 mg Oral Daily  . levETIRAcetam  500 mg Intravenous Q12H  . tamsulosin  0.4 mg Oral QPC breakfast    OBJECTIVE: Blood pressure 156/72, pulse 83, temperature 97.3 F (36.3 C), temperature source Oral, resp. rate 16, height 6' (1.829 m), weight 238 lb 9.6 oz (108.228 kg), SpO2 95 %.   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  Plan: 1. Benzathine penicillin 2.4 million units IM weekly x 3 2. I will sign off now but please call if we can be of further assistance while he is here  Michel Bickers, MD [facility] [place] [phone] pager   [phone] cell 06/16/2014, 11:13 AM

## 2014-06-16 NOTE — Progress Notes (Signed)
VASCULAR LAB PRELIMINARY  PRELIMINARY  PRELIMINARY  PRELIMINARY  Bilateral lower extremity venous Dopplers completed.    Preliminary report:  There is no DVT or SVT noted in the bilateral lower extremities.   Adriell Polansky, RVT 06/16/2014, 2:57 PM

## 2014-06-17 MED ORDER — PNEUMOCOCCAL VAC POLYVALENT 25 MCG/0.5ML IJ INJ
0.5000 mL | INJECTION | INTRAMUSCULAR | Status: AC
  Administered 2014-06-18: 0.5 mL via INTRAMUSCULAR
  Filled 2014-06-17: qty 0.5

## 2014-06-17 MED ORDER — INFLUENZA VAC SPLIT QUAD 0.5 ML IM SUSY
0.5000 mL | PREFILLED_SYRINGE | INTRAMUSCULAR | Status: DC
  Filled 2014-06-17 (×2): qty 0.5

## 2014-06-17 NOTE — Progress Notes (Signed)
Family Medicine Teaching Service Daily Progress Note Intern Pager: [phone]  Patient name: Mark Soto Medical record number: [id number] Date of birth: May 31, 1941 Age: 73 y.o. Gender: male  Primary Care Provider: Reginia Forts, MD Consultants: neuro Code Status: FULL  Pt Overview and Major Events to Date:  06/13/14: patient admitted with acutely worsening AMS in setting of 3 year decline in memory and functioning at home; had seizure in ED, then found to have acute PCA infarct on MRI 06/14/14: patient continued on keppra for seizure management; stroke workup done  Assessment and Plan: Mark Soto is a 73 y.o. 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 PMH is significant for HTN, anemia, prior AMS.  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 No hx of prior stroke. - stroke workup: TEE with large PFO, carotid and LE dopplers today, A1c and lipids done - aspirin 325mg   - atorvastatin 80mg  for secondary stroke prevention - PT/OT recs: 24hr supervision/snf - neurology consulting, appreciate recs  #Seizure: patient with apparent tonic clonic seizure lasting 1 minute. No hx of seizures. No significant electrolyte abnormalities. 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 - on Keppra 500mg  IV BID, per neuro recs - EEG completed, showed epileptogenic focus - MRI completed, PCA stroke involving hypothalamus could have caused seizure per radiology - Neuro following, appreciate recs  # Dementia: Patient with 3 year hx of gradually worsening confusion, likely dementia. Acute PCA stroke likely caused his acute worsening.  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 On exam, patient has ataxia, no argyll robertson pupils.  Given dementia and ataxia, concerned for neurosyphilis.  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  - BPs currently [id number] systolic. - continue amlodipine 2.5mg  daily and HCTZ 25 mg daily  # Pre-diabetes: A1c of 6.3 here.  - will recommend diet and exercise lifestyle modifications - f/u A1c in clinic in 3-6 months  FEN/GI: dysphagia diet, SLIV Prophylaxis: subq heparin  Disposition: SW finding SNF placement currently, would like to see CSF VDRL prior to DC as this would require penicillin drip if positive.   Subjective:  Doing well without complaints, doesn't really remember the plan of DC to SNF but is agreeable. Good appetite.  Objective: Temp:  [97.5 F (36.4 C)-97.9 F (36.6 C)] 97.9 F (36.6 C) (11/08 0535) Pulse Rate:  [81-93] 81 (11/08 0535) Resp:  [17-19] 17 (11/08 0535) BP: (137-148)/(76-80) 148/80 mmHg (11/08 0535) SpO2:  [95 %-100 %] 95 % (11/08 0535) Physical Exam: General: NAD, lying in bed and dozing. Extremely agreeable Cardiovascular: RRR, no M/R/G Respiratory: normal wob, CTAB Abdomen: obese, NABS, nontender, nondistended, no masses or HSM Extremities: No edema BL warm and well-perfused Neuro: alert and conversational   Laboratory:  Recent Labs Lab 06/12/14 1107 06/13/14 1135  WBC 8.0 8.7  HGB 12.6* 11.9*  HCT 40.2* 37.1*  PLT  --  180    Recent Labs Lab 06/12/14 1104 06/13/14 1135  NA 140 139  K 4.8  4.5  CL 103 102  CO2 27 26  BUN 16 15  CREATININE 1.02 0.90  CALCIUM 9.2 9.1  PROT 7.5  --   BILITOT 0.5  --   ALKPHOS 78  --   ALT 11  --   AST 15  --   GLUCOSE 113* 145*   Vit B12: 308 Folate: >20.0 TSH: 0.757 HIV: nonreactive A1c: 6.3 RPR: Reactive, 1:16 titer FTA IgG: reactive  Lipid  Panel     Component Value Date/Time   CHOL 171 06/14/2014 1030   TRIG 115 06/14/2014 1030   HDL 54 06/14/2014 1030   CHOLHDL 3.2 06/14/2014 1030   VLDL 23 06/14/2014 1030   LDLCALC 94 06/14/2014 1030    06/15/2014 16:41  RBC Count, CSF 0  WBC, CSF 1  Lymphs, CSF RARE  Monocyte-Macrophage-Spinal Fluid RARE  Other Cells, CSF TOO FEW TO COUNT,...  Appearance, CSF CLEAR  Color, CSF COLORLESS   Imaging/Diagnostic Tests: EKG: sinus rhythm Abnormal R wave progression, early transition LVH with secondary repolarization abnormality Now w/ T wave inversions in V3-V6, I, II, and aVL  CT Head:  IMPRESSION: 1. No acute intracranial findings. 2. White matter microvascular disease. 3. Maxillary sinus inflammation.  MRI: IMPRESSION: Areas of acute infarction in the left posterior cerebral artery territory affecting the occipital cortical and subcortical brain. There is some involvement of the hypothalamus adjacent to the atrium of the left lateral ventricle, which could relate to the seizure. No hemorrhage or mass effect. Extensive chronic small vessel ischemic changes elsewhere throughout the brain. No anterior circulation pathology seen. Diminished flow within 1 of the 2 main left PCA branches distally.   Mark Apple, MD [facility] Resident, PGY-3 06/17/2014, 11:27 AM
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
## 2014-06-18 ENCOUNTER — Encounter (HOSPITAL_COMMUNITY): Payer: Self-pay | Admitting: Internal Medicine

## 2014-06-18 DIAGNOSIS — I63432 Cerebral infarction due to embolism of left posterior cerebral artery: Secondary | ICD-10-CM | POA: Diagnosis not present

## 2014-06-18 DIAGNOSIS — A528 Late syphilis, latent: Secondary | ICD-10-CM

## 2014-06-18 LAB — VDRL, CSF: SYPHILIS VDRL QUANT CSF: NONREACTIVE

## 2014-06-18 LAB — FLUORESCENT TREPONEMAL AB(FTA)-IGG-BLD: FLUORESCENT TREPONEMAL AB, IGG: REACTIVE — AB
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
## 2014-06-18 NOTE — Progress Notes (Signed)
Chaplain responded to page from pt nurse regarding advanced directive. Chaplain met with pt and pt daughter. While going over the AD pt needed some time to think about living will questions. Chaplain asked pt to page chaplain when he has thought about it more. Pt and family appreciative of chaplain going over the AD with them.   06/18/14 1000  Clinical Encounter Type  Visited With Patient and family together;Health care provider  Visit Type Initial  Referral From Nurse  Consult/Referral To [place] (For Healthcare)  Does patient have an advance directive? No  Would patient like information on creating an advanced directive? Yes - Educational materials given  Marcelino Scot 06/18/2014 10:38 AM

## 2014-06-18 NOTE — Care Management Note (Signed)
  Page 1 of 1   06/19/2014     8:26:08 AM CARE MANAGEMENT NOTE 06/19/2014  Patient:  Mark Soto, Mark Soto   Account Number:  [id number]  Date Initiated:  06/18/2014  Documentation initiated by:  Magdalen Spatz  Subjective/Objective Assessment:     Action/Plan:   Anticipated DC Date:     Anticipated DC Plan:  [place] referral  Clinical Social Worker         Choice offered to / List presented to:             Status of service:   Medicare Important Message given?  YES (If response is "NO", the following Medicare IM given date fields will be blank) Date Medicare IM given:  06/18/2014 Medicare IM given by:  Magdalen Spatz Date Additional Medicare IM given:  06/19/2014 Additional Medicare IM given by:  Magdalen Spatz  Discharge Disposition:    Per UR Regulation:    If discussed at [place] Length of Stay Meetings, dates discussed:   06/19/2014    Comments:

## 2014-06-18 NOTE — Progress Notes (Signed)
CRITICAL VALUE ALERT  Critical value received:  FTA antibody test=reactive  Date of notification:  06/18/14  Time of notification:  8377  Critical value read back:Yes.    Nurse who received alert:  Oren Bracket  MD notified (1st page):  Family medicine pager  Time of first page:  1415  MD notified (2nd page):  Time of second page:  Responding MD:  Dr. Lorenso Courier  Time MD responded:  9396   No new orders at this time.  Will continue to monitor.

## 2014-06-18 NOTE — Progress Notes (Signed)
Chaplain followed up with pt and family for advanced directive (AD) when paged by nurse. Chaplain went over AD with patient. Plan is to notarize the AD first thing in the morning.    06/18/14 1700  Clinical Encounter Type  Visited With Patient and family together;Health care provider  Visit Type Follow-up  Referral From Nurse  Consult/Referral To Chaplain  Rolly Salter, Chaplain 06/18/2014 5:02 PM

## 2014-06-18 NOTE — Progress Notes (Signed)
Family Medicine Teaching Service Daily Progress Note Intern Pager: [phone]  Patient name: Mark Soto Medical record number: [id number] Date of birth: 1941-05-05 Age: 73 y.o. Gender: male  Primary Care Provider: Reginia Forts, MD Consultants: neuro Code Status: FULL  Pt Overview and Major Events to Date:  06/13/14: patient admitted with acutely worsening AMS in setting of 3 year decline in memory and functioning at home; had seizure in ED, then found to have acute PCA infarct on MRI 06/14/14: patient continued on keppra for seizure management; stroke workup done  Assessment and Plan: Mark Soto is a 73 y.o. 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 PMH is significant for HTN, anemia, prior AMS.  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 No hx of prior stroke. - stroke workup: TEE with large PFO, carotid dopplers with 1-39% ICA stenosis and LE dopplers negative for DVT, A1c and lipids done - Continue aspirin 325mg   - Continue atorvastatin 80mg  for secondary stroke prevention - PT/OT recs: 24hr supervision/snf - neurology consulting, signed off  #Seizure: patient with apparent tonic clonic seizure lasting 1 minute. No hx of seizures. No significant electrolyte abnormalities. 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 - on Keppra 500mg  IV BID, per neuro recs - EEG completed, showed epileptogenic focus - MRI completed, PCA stroke involving hypothalamus could have caused seizure per radiology  # Dementia: Patient with 3 year hx of gradually worsening confusion, likely dementia. Acute PCA stroke likely caused his acute worsening.  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 On exam, patient has ataxia, no  argyll robertson pupils. Given dementia and ataxia, concerned for neurosyphilis.  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  - BPs currently 315V-761Y systolic. - continue amlodipine 2.5mg  daily and HCTZ 25 mg daily  # Pre-diabetes: A1c of 6.3 here.  - will recommend diet and exercise lifestyle modifications - f/u A1c in clinic in 3-6 months  FEN/GI: dysphagia diet, SLIV Prophylaxis: subq heparin  Disposition: SW finding SNF placement currently, would like to see CSF VDRL prior to DC as this would require penicillin drip if positive.   Subjective:  Doing well without complaints, doesn't really remember why he is here.  Objective: Temp:  [97.4 F (36.3 C)-97.9 F (36.6 C)] 97.9 F (36.6 C) (11/09 0445) Pulse Rate:  [81-96] 81 (11/09 0445) Resp:  [16-18] 18 (11/09 0445) BP: (142-156)/(71-78) 154/73 mmHg (11/09 0445) SpO2:  [93 %-97 %] 97 % (11/09 0445) Physical Exam: General: NAD, sitting in bedside chair. Extremely agreeable Cardiovascular: RRR, no M/R/G Respiratory: normal wob, CTAB Abdomen: obese, NABS, nontender, nondistended, no masses or HSM Extremities: No edema BL warm and well-perfused Neuro: alert and conversational   Laboratory:  Recent Labs Lab 06/12/14 1107 06/13/14 1135  WBC 8.0 8.7  HGB 12.6* 11.9*  HCT 40.2* 37.1*  PLT  --  180    Recent Labs Lab 06/12/14 1104 06/13/14 1135  NA 140 139  K 4.8 4.5  CL 103  102  CO2 27 26  BUN 16 15  CREATININE 1.02 0.90  CALCIUM 9.2 9.1  PROT 7.5  --   BILITOT 0.5  --   ALKPHOS 78  --   ALT 11  --   AST 15  --   GLUCOSE 113* 145*   Vit B12: 308 Folate: >20.0 TSH: 0.757 HIV: nonreactive A1c: 6.3 RPR: Reactive, 1:16 titer FTA IgG: reactive  Lipid Panel      Component Value Date/Time   CHOL 171 06/14/2014 1030   TRIG 115 06/14/2014 1030   HDL 54 06/14/2014 1030   CHOLHDL 3.2 06/14/2014 1030   VLDL 23 06/14/2014 1030   LDLCALC 94 06/14/2014 1030    06/15/2014 16:41  RBC Count, CSF 0  WBC, CSF 1  Lymphs, CSF RARE  Monocyte-Macrophage-Spinal Fluid RARE  Other Cells, CSF TOO FEW TO COUNT,...  Appearance, CSF CLEAR  Color, CSF COLORLESS   Imaging/Diagnostic Tests: EKG: sinus rhythm Abnormal R wave progression, early transition LVH with secondary repolarization abnormality Now w/ T wave inversions in V3-V6, I, II, and aVL  CT Head:  IMPRESSION: 1. No acute intracranial findings. 2. White matter microvascular disease. 3. Maxillary sinus inflammation.  MRI: IMPRESSION: Areas of acute infarction in the left posterior cerebral artery territory affecting the occipital cortical and subcortical brain. There is some involvement of the hypothalamus adjacent to the atrium of the left lateral ventricle, which could relate to the seizure. No hemorrhage or mass effect. Extensive chronic small vessel ischemic changes elsewhere throughout the brain. No anterior circulation pathology seen. Diminished flow within 1 of the 2 main left PCA branches distally.   Laroy Apple, MD [place] Resident, PGY-1 06/18/2014, 9:53 AM

## 2014-06-18 NOTE — Progress Notes (Addendum)
Speech Language Pathology Treatment: Cognitive-Linquistic;Dysphagia  Patient Details Name: Mark Soto MRN: [id number] DOB: 06-06-1941 Today's Date: 06/18/2014 Time: 5909-3112 SLP Time Calculation (min): 34 min  Assessment / Plan / Recommendation Clinical Impression  Pt demonstrates swallow function within normal limits. Recommend upgrade to regular diet with thin liquids. No follow up for swallow. Pt demonstrates improved cognitive function as compared to previous sessions, however he continues to show deficits in memory and was noted to perseverate on some topics. SLP provided moderate verbal cueing for recall of biographical facts, utilization of environmental cues for temporal orientation questions, and completion of written memory aides. SLP also provided education regarding creation and use of memory aides to improve recall of relevant information with family, friends, and staff. Recommend continued speech therapy services to focus on cognitive linguistic goals.    HPI HPI: Mark Soto is a 73 y.o. 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 PMH is significant for HTN, anemia, prior AMS.   Pertinent Vitals Pain Assessment: No/denies pain  SLP Plan  Continue with current plan of care    Recommendations Diet recommendations: Regular;Thin liquid Liquids provided via: Cup;Straw Medication Administration: Whole meds with liquid Supervision: Patient able to self feed Compensations: Slow rate;Small sips/bites Postural Changes and/or Swallow Maneuvers: Seated upright 90 degrees              Oral Care Recommendations: Oral care BID Follow up Recommendations: 24 hour supervision/assistance;Home health SLP;Outpatient SLP Plan: Continue with current plan of care    GO     Eden Emms 06/18/2014, 3:18 PM

## 2014-06-18 NOTE — Progress Notes (Signed)
Physical Therapy Treatment Patient Details Name: Mark Soto MRN: [id number] DOB: 12-Mar-1941 Today's Date: 06/18/2014    History of Present Illness Patient is a 73 y/o male presenting with AMS, confusion and progressing memory decline. While in ED pt had a witnessed seizure. CT head shows no acute infarct. PMH is significant for HTN, anemia, prior AMS. Per MD note: For the past 3 years, patient has been getting increasingly confused, at times wandering off. He also intermittently complains of dizziness. Pt was [facility]  and the staff there noted him to be more confused than normal so they called EMS who brought him to the ED. MRI brain-Areas of acute infarction in the left posterior cerebral artery.     PT Comments    Patient able to increase ambulation this session with several rest break. Patient with STM issues leading him to be unsafe and requiring cues for mobility and pathfinding. 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     Follow Up Recommendations  SNF;Supervision/Assistance - 24 hour     Equipment Recommendations  Other (comment) (TBD)    Recommendations for Other Services       Precautions / Restrictions Precautions Precautions: Fall Restrictions Weight Bearing Restrictions: No    Mobility  Bed Mobility         Supine to sit: Supervision     General bed mobility comments: Supervision for safety. TOld not to stand once EOB but patient immediately stood once up  Transfers Overall transfer level: Needs assistance Equipment used: Rolling walker (2 wheeled)   Sit to Stand: Min guard         General transfer comment: Min guard for safety due to impulsiveness.  Ambulation/Gait Ambulation/Gait assistance: Min guard Ambulation Distance (Feet): 400 Feet Assistive device: Rolling walker (2 wheeled)   Gait velocity: slow Gait velocity  interpretation: Below normal speed for age/gender General Gait Details: Utilized RW this session for balance and support. Initially having difficulty manuvering around objects but once cued patient improved. Worked on pathfinding back to room from main entrance to 6N. Needed occassional cues    Stairs            Wheelchair Mobility    Modified Rankin (Stroke Patients Only) Modified Rankin (Stroke Patients Only) Pre-Morbid Rankin Score: No symptoms Modified Rankin: Moderately severe disability     Balance     Sitting balance-Leahy Scale: Good       Standing balance-Leahy Scale: Good                      Cognition Arousal/Alertness: Awake/alert Behavior During Therapy: Impulsive Overall Cognitive Status: Impaired/Different from baseline   Orientation Level: Place;Time;Situation   Memory: Decreased short-term memory Following Commands: Follows one step commands with increased time;Follows multi-step commands inconsistently Safety/Judgement: Decreased awareness of deficits;Decreased awareness of safety   Problem Solving: Slow processing;Difficulty sequencing;Requires verbal cues General Comments: Reoriented to place and year however 5 mins later unable to recall.     Exercises      General Comments        Pertinent Vitals/Pain Pain Assessment: No/denies pain    Home Living                      Prior Function            PT Goals (current goals can now be found  in the care plan section) Progress towards PT goals: Progressing toward goals    Frequency  Min 3X/week    PT Plan Current plan remains appropriate    Co-evaluation             End of Session   Activity Tolerance: Patient tolerated treatment well Patient left: in chair;with call bell/phone within reach;with chair alarm set     Time: 0757-0822 PT Time Calculation (min): 25 min  Charges:  $Gait Training: 8-22 mins $Therapeutic Activity: 8-22 mins                     G Codes:      Jacqualyn Posey 06/18/2014, 9:04 AM 06/18/2014 Jacqualyn Posey PTA [phone] pager [phone] office

## 2014-06-19 ENCOUNTER — Encounter (HOSPITAL_COMMUNITY)
Admission: EM | Disposition: A | Discharge status: Skilled Nursing Facility | Payer: Self-pay | Source: Home / Self Care | Attending: Family Medicine

## 2014-06-19 DIAGNOSIS — I639 Cerebral infarction, unspecified: Secondary | ICD-10-CM

## 2014-06-19 HISTORY — PX: LOOP RECORDER IMPLANT: SHX5477

## 2014-06-19 LAB — CREATININE, SERUM
Creatinine, Ser: 1.11 mg/dL (ref 0.50–1.35)
GFR calc Af Amer: 75 mL/min — ABNORMAL LOW (ref >90)
GFR, EST NON AFRICAN AMERICAN: 64 mL/min — AB (ref >90)

## 2014-06-19 LAB — FLUORESCENT TREPONEMAL AB(FTA)-IGG-BLD: FLUORESCENT TREPONEMAL AB, IGG: REACTIVE — AB

## 2014-06-19 LAB — CBC
HEMATOCRIT: 40.4 % (ref 39.0–52.0)
Hemoglobin: 13.1 g/dL (ref 13.0–17.0)
MCH: 23.5 pg — AB (ref 26.0–34.0)
MCHC: 32.4 g/dL (ref 30.0–36.0)
MCV: 72.4 fL — ABNORMAL LOW (ref 78.0–100.0)
Platelets: 236 10*3/uL (ref 150–400)
RBC: 5.58 MIL/uL (ref 4.22–5.81)
RDW: 15.8 % — ABNORMAL HIGH (ref 11.5–15.5)
WBC: 10.4 10*3/uL (ref 4.0–10.5)

## 2014-06-19 SURGERY — LOOP RECORDER IMPLANT

## 2014-06-19 MED ORDER — ASPIRIN 325 MG PO TBEC: 325.0000 mg | DELAYED_RELEASE_TABLET | Freq: Every day | ORAL | Status: DC

## 2014-06-19 MED ORDER — ACETAMINOPHEN 325 MG PO TABS: 325.0000 mg | ORAL_TABLET | ORAL | Status: DC | PRN

## 2014-06-19 MED ORDER — HEPARIN SODIUM (PORCINE) 5000 UNIT/ML IJ SOLN: 5000.0000 [IU] | Freq: Three times a day (TID) | INTRAMUSCULAR | Status: DC

## 2014-06-19 MED ORDER — ONDANSETRON HCL 4 MG/2ML IJ SOLN: 4.0000 mg | Freq: Four times a day (QID) | INTRAMUSCULAR | Status: DC | PRN

## 2014-06-19 MED ORDER — LEVETIRACETAM 500 MG PO TABS
500.0000 mg | ORAL_TABLET | Freq: Two times a day (BID) | ORAL | Status: DC
  Administered 2014-06-19: 500 mg via ORAL
  Filled 2014-06-19 (×2): qty 1

## 2014-06-19 MED ORDER — LIDOCAINE HCL (PF) 1 % IJ SOLN
INTRAMUSCULAR | Status: AC
  Filled 2014-06-19: qty 30

## 2014-06-19 MED ORDER — ATORVASTATIN CALCIUM 80 MG PO TABS: 80.0000 mg | ORAL_TABLET | Freq: Every day | ORAL | Status: DC

## 2014-06-19 MED ORDER — TAMSULOSIN HCL 0.4 MG PO CAPS: 0.4000 mg | ORAL_CAPSULE | Freq: Every day | ORAL | Status: DC

## 2014-06-19 MED ORDER — PENICILLIN G BENZATHINE 1200000 UNIT/2ML IM SUSP: 2.4000 10*6.[IU] | INTRAMUSCULAR | Status: AC

## 2014-06-19 MED ORDER — LEVETIRACETAM 500 MG PO TABS: 500.0000 mg | ORAL_TABLET | Freq: Two times a day (BID) | ORAL | Status: DC

## 2014-06-19 NOTE — Progress Notes (Signed)
Chaplain followed up with patient to assist with completion of advance directives.  Patient said his daughter has documents and he was had little knowledge about it.  Spoke with patient nurse and ask that when daughter returns if patient still want AD call for Chaplain.

## 2014-06-19 NOTE — Clinical Social Work Note (Signed)
Patient will discharge to [facility] Anticipated discharge date: 06/19/14 Family notified: Left messages with both contacts in [employer] by [place] signing off.  Domenica Reamer, [place] Social Worker [phone]

## 2014-06-19 NOTE — CV Procedure (Signed)
EP Procedure Note  Procedure: Insertion of an ILR  Indication: cryptogenic stroke  Pre-procedure Diagnosis: cryptogenic stroke  Post-procedure Diagnosis: Same as preprocedure diagnosis  Description of the procedure: After informed consent was obtained, the patient was prepped and draped in a sterile fashion. 20 cc of lidocaine was infiltrated and a one cm stab incision made. The Medtronic ILR, serial [id number] was inserted into the subcutaneous space. R waves measured 0.2 mV. Benzoin and steristrips were painted on the skin and the patient recovered in the usual manner.   Complications: none immediately.  Conclusion: successful insertion of a Medtronic ILR in a patient with cryptogenic stroke.  Mikle Bosworth.D.

## 2014-06-19 NOTE — Progress Notes (Signed)
Report called to York Cerise, Therapist, sports at [facility].  All questions answered and RN instructed to call unit back if has any questions. Syliva Overman

## 2014-06-19 NOTE — Progress Notes (Signed)
SLP Cancellation Note  Patient Details Name: Mark Soto MRN: [id number] DOB: May 30, 1941   Cancelled treatment:       Reason Eval/Treat Not Completed: Other (comment) pt being discharged to SNF. Pt will benefit from continued SLP services at next level of care.    Germain Osgood, M.A. CCC-SLP [phone]  Germain Osgood 06/19/2014, 4:33 PM

## 2014-06-19 NOTE — Progress Notes (Signed)
Family Medicine Teaching Service Daily Progress Note Intern Pager: [phone]  Patient name: Mark Soto Medical record number: [id number] Date of birth: [birth year] Age: 73 y.o. Gender: male  Primary Care Provider: Reginia Forts, MD Consultants: neuro Code Status: FULL  Pt Overview and Major Events to Date:  06/13/14: patient admitted with acutely worsening AMS in setting of 3 year decline in memory and functioning at home; had seizure in ED, then found to have acute PCA infarct on MRI 06/14/14: patient continued on keppra for seizure management; stroke workup done 06/15/14: LP done to rule out neurosyphilis 06/16/14: started on benzathine penicillin 2.4 million units IM weekly 06/17/14: no acute events 06/18/14: no acute events  Assessment and Plan: Mark Soto is a 73 y.o. 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 PMH is significant for HTN, anemia, prior AMS.  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 No hx of prior stroke. Embolic stroke, per neuro. - stroke workup: TEE with large PFO, carotid dopplers with 1-39% ICA stenosis and LE dopplers negative for DVT, A1c and lipids done - Continue aspirin 325mg   - Continue atorvastatin 80mg  for secondary stroke prevention - Echo showed large PFO, bilateral LE dopplers negative. Neuro recommends Loop recorder to look for source of embolic stroke - PT/OT recs: 24hr supervision/snf - neurology consulting, signed off  #Seizure: patient with generalized seizure lasting 1 minute. No hx of seizures. No significant electrolyte abnormalities. 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 - on Keppra 500mg  IV BID, per neuro recs--transition to po today at same dose - EEG completed, showed epileptogenic focus - MRI completed, PCA stroke involving hypothalamus could have caused seizure per radiology  # Dementia: Patient with 3 year hx of  gradually worsening confusion, likely dementia. Acute PCA stroke likely caused his acute worsening.  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 CSF VDRL nonreactive.  - ID consulted, appreciate recs - CSF cell counts normal and VDRL nonreactive, indicating no neurosyphilis - Tx for latent syphilis with 2.4 million units Benzathine penn IM weekly X 3 weeks (1 dose completed here on 11/7)  # HTN: hx of hypertension,  - home meds include amlodipine and HCTZ.  - BPs currently 409B-353G systolic. - continue amlodipine 2.5mg  daily and HCTZ 25 mg daily  # Pre-diabetes: A1c of 6.3 here.  - will recommend diet and exercise lifestyle modifications - f/u A1c in clinic in 3-6 months  FEN/GI: dysphagia diet, SLIV Prophylaxis: subq heparin  Disposition: SW finding SNF placement currently, [employer]?, d/c pending placement and loop recorder  Subjective:  Doing well without complaints. Sitting up in chair, eating breakfast and looking at newspaper.  Objective: Temp:  [97.9 F (36.6 C)-98 F (36.7 C)] 98 F (36.7 C) (11/10 0544) Pulse Rate:  [89-93] 89 (11/10 0544) Resp:  [18] 18 (11/10 0544) BP: (148-154)/(76-86) 148/86 mmHg (11/10 0544) SpO2:  [94 %-98 %] 95 % (11/10 0544) Physical Exam: General: NAD, sitting in bedside chair. Extremely amicable and agreeable. Cardiovascular: RRR, no M/R/G Respiratory: normal wob, CTAB Abdomen: obese, NABS, nontender, nondistended, no masses or HSM Extremities: No edema BL, warm and well-perfused Neuro: alert and conversational   Laboratory:  Recent Labs Lab 06/12/14 1107 06/13/14 1135  WBC 8.0  8.7  HGB 12.6* 11.9*  HCT 40.2* 37.1*  PLT  --  180    Recent Labs Lab 06/12/14 1104 06/13/14 1135  NA 140 139  K 4.8 4.5  CL 103 102  CO2 27 26  BUN 16 15   CREATININE 1.02 0.90  CALCIUM 9.2 9.1  PROT 7.5  --   BILITOT 0.5  --   ALKPHOS 78  --   ALT 11  --   AST 15  --   GLUCOSE 113* 145*   Vit B12: 308 Folate: >20.0 TSH: 0.757 HIV: nonreactive A1c: 6.3 RPR: Reactive, 1:16 titer FTA IgG: reactive  Lipid Panel     Component Value Date/Time   CHOL 171 06/14/2014 1030   TRIG 115 06/14/2014 1030   HDL 54 06/14/2014 1030   CHOLHDL 3.2 06/14/2014 1030   VLDL 23 06/14/2014 1030   LDLCALC 94 06/14/2014 1030    06/15/2014 16:41  RBC Count, CSF 0  WBC, CSF 1  Lymphs, CSF RARE  Monocyte-Macrophage-Spinal Fluid RARE  Other Cells, CSF TOO FEW TO COUNT,...  Appearance, CSF CLEAR  Color, CSF COLORLESS   CSF VDRL: nonreactive  Imaging/Diagnostic Tests: EKG: sinus rhythm Abnormal R wave progression, early transition LVH with secondary repolarization abnormality Now w/ T wave inversions in V3-V6, I, II, and aVL  CT Head:  IMPRESSION: 1. No acute intracranial findings. 2. White matter microvascular disease. 3. Maxillary sinus inflammation.  MRI: IMPRESSION: Areas of acute infarction in the left posterior cerebral artery territory affecting the occipital cortical and subcortical brain. There is some involvement of the hypothalamus adjacent to the atrium of the left lateral ventricle, which could relate to the seizure. No hemorrhage or mass effect. Extensive chronic small vessel ischemic changes elsewhere throughout the brain. No anterior circulation pathology seen. Diminished flow within 1 of the 2 main left PCA branches distally.  Darlys Gales, [facility]  06/19/2014, 8:09 AM  I agree with the medical student note above and have edited it as necessary. I formulated the plan and performed my own physical exam which are documented above.  Beverlyn Roux, MD, MPH [facility] PGY-1 06/19/2014 4:47 PM

## 2014-06-19 NOTE — Progress Notes (Signed)
Critical lab results called to MD FTA Antiboby Igg reactive. No new orders given.

## 2014-06-21 ENCOUNTER — Encounter: Payer: Self-pay | Admitting: Internal Medicine

## 2014-06-21 ENCOUNTER — Non-Acute Institutional Stay (SKILLED_NURSING_FACILITY): Payer: Medicare Other | Admitting: Adult Health

## 2014-06-21 DIAGNOSIS — I10 Essential (primary) hypertension: Secondary | ICD-10-CM

## 2014-06-21 DIAGNOSIS — R569 Unspecified convulsions: Secondary | ICD-10-CM

## 2014-06-21 DIAGNOSIS — E785 Hyperlipidemia, unspecified: Secondary | ICD-10-CM

## 2014-06-21 DIAGNOSIS — N400 Enlarged prostate without lower urinary tract symptoms: Secondary | ICD-10-CM

## 2014-06-21 DIAGNOSIS — I6340 Cerebral infarction due to embolism of unspecified cerebral artery: Secondary | ICD-10-CM

## 2014-06-21 DIAGNOSIS — A528 Late syphilis, latent: Secondary | ICD-10-CM

## 2014-06-22 ENCOUNTER — Other Ambulatory Visit: Payer: Self-pay | Admitting: Internal Medicine

## 2014-06-22 ENCOUNTER — Telehealth (HOSPITAL_BASED_OUTPATIENT_CLINIC_OR_DEPARTMENT_OTHER): Payer: Self-pay | Admitting: Emergency Medicine

## 2014-06-25 ENCOUNTER — Encounter: Payer: Self-pay | Admitting: Adult Health

## 2014-06-25 DIAGNOSIS — N400 Enlarged prostate without lower urinary tract symptoms: Secondary | ICD-10-CM | POA: Insufficient documentation

## 2014-06-25 DIAGNOSIS — I10 Essential (primary) hypertension: Secondary | ICD-10-CM | POA: Insufficient documentation

## 2014-06-25 NOTE — Progress Notes (Signed)
Patient ID: Mark Soto, male   DOB: Jun 28, 1941, 73 y.o.   MRN: [id number]    [facility]   No Known Allergies     Chief Complaint  Patient presents with  . Hospitalization Follow-up    HPI:  He has been hospitalized for seizure acute pca cva; increased confusion. 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 His confusion has been getting for the past 3 years and per his medical records he has been wandering. He is here for short term rehab at this time with his goal to return back home.     Past Medical History  Diagnosis Date  . Hypertension   . Tinnitus     Right Ear  . Anemia   . Abdominal wall hernia   . Personal history of colonic adenomas 04/14/2013  . 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 Seizures 06/13/2014    Past Surgical History  Procedure Laterality Date  . None    . Tee without cardioversion N/A 06/15/2014    Procedure: TRANSESOPHAGEAL ECHOCARDIOGRAM (TEE);  Surgeon: Fay Records, MD;  Location: [facility] ENDOSCOPY;  Service: Cardiovascular;  Laterality: N/A;    VITAL SIGNS BP 136/88 mmHg  Pulse 83  Ht 6' (1.829 m)  Wt 241 lb (109.317 kg)  BMI 32.68 kg/m2   Outpatient Encounter Prescriptions as of 06/21/2014  Medication Sig  . amLODipine (NORVASC) 2.5 MG tablet Take 1 tablet (2.5 mg total) by mouth daily.  Marland Kitchen aspirin 325 MG EC tablet Take 1 tablet (325 mg total) by mouth daily.  Marland Kitchen atorvastatin (LIPITOR) 80 MG tablet Take 1 tablet (80 mg total) by mouth daily at 6 PM.  . hydrochlorothiazide (HYDRODIURIL) 25 MG tablet TAKE 1 TABLE  25 MG TOTAL BY MOUTH DAILY  NEEDS FOLLOW UP FOR ADDITION REFILLS  . levETIRAcetam (KEPPRA) 500 MG tablet Take 1 tablet (500 mg total) by mouth every 12 (twelve) hours.  . penicillin g benzathine (BICILLIN LA) 1200000 UNIT/2ML SUSP injection Inject 4 mLs (2.4 Million Units total) into the muscle once a week.  . tamsulosin (FLOMAX) 0.4 MG CAPS capsule Take 1 capsule (0.4 mg total) by mouth daily after  breakfast.     SIGNIFICANT DIAGNOSTIC EXAMS  06-13-14: ct of head: 1. No acute intracranial findings.2. White matter microvascular disease. 3. Maxillary sinus inflammation  06-13-14: MRI/MRA: head: Areas of acute infarction in the left posterior cerebral artery territory affecting the occipital cortical and subcortical brain. There is some involvement of the hypothalamus adjacent to the atrium of the left lateral ventricle, which could relate to the seizure. No hemorrhage or mass effect. Extensive chronic small vessel ischemic changes elsewhere throughout the brain. No anterior circulation pathology seen. Diminished flow within 1 of the 2 main left PCA branches distally  06-15-14: TEE: Left ventricle:  LVEF is normal.  Aortic valve:  AV is mildly thickened. No AI. Mitral valve:  MV is normal. Trace MR. Left atrium:  LA appendage without. masses. Atrial septum:  Large PFO as tested by color doppler and with injection of agitated saline with bubbles seen in L sided chambers. Pulmonic valve:   PV is normal. Tricuspid valve:  TV is normal MIld TR.  06-16-14:bilateral lower extremity doppler: - No evidence of deep vein or superficial thrombosis involving the right lower extremity and left lower extremity. - No evidence of Baker&'s cyst on the right or left.  06-16-14: carotid doppler: Mild soft plaque at origins of both ICAs with mild 1-39% stenosis. Vertebral  artery flow is antegrade bilaterally.   LABS REVIEWED:   06-12-14: glucose 113; bun 16; creat 1.02; k+4.8; na++ 140; liver normal albumin 4.3 06-13-14: wbc 8.7; hgb 11.9; hct 37.1; mcv 73.8; lt 180; glucose 145; bun 15; creat 0.9; k+4.5; na++139; fluorescent treponemal AB IgG REACTIVE; RPR titer: 1: 16; hiv: nr; RPR: reactive tsh 0.757; vit b12: 308; folate >20.0;  06-14-14: chol 171; ldl 94; trig 115; hgb a1c 6.3; vdrl csf: nr 06-19-14: wbc 10.4; hgb 13.1; hct 40.4; mcv 72.4; plt 236      Review of Systems  Constitutional: Negative  for malaise/fatigue.  Respiratory: Negative for cough and shortness of breath.   Cardiovascular: Negative for chest pain, palpitations and leg swelling.  Gastrointestinal: Negative for heartburn, abdominal pain and constipation.  Musculoskeletal: Negative for myalgias and joint pain.  Skin: Negative.   Neurological: Negative for dizziness and headaches.  Psychiatric/Behavioral: Negative for depression. The patient is not nervous/anxious and does not have insomnia.      Physical Exam  Constitutional: He appears well-developed and well-nourished. No distress.  overweight  Neck: Neck supple. No JVD present. No thyromegaly present.  Cardiovascular: Normal rate and regular rhythm.   Respiratory: Effort normal and breath sounds normal. No respiratory distress. He has no wheezes.  GI: Soft. Bowel sounds are normal. He exhibits no distension. There is no tenderness.  Musculoskeletal: He exhibits no edema.  Is able to move all extremities; is ambulating with therapy   Neurological: He is alert.  Skin: Skin is warm and dry. He is not diaphoretic.       ASSESSMENT/ PLAN:  1. Cerebral embolism with cva: he is presently neurologically stable; will continue therapy as directed to improve upon strength and independence; will follow up neurology as directed; will continue asa 325 mg daily. He has an internal loop recorder will follow up with cardiology   2. Seizure: no further seizure activity present; will continue keppra 500 mg twice daily and will monitor   3. Hypertension: will continue norvasc 2.5 mg daily and hctz 25 mg daily   4. Dyslipidemia: will continue lipitor 80 mg daily  5. 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 BPH: will continue flomax daily    Time spent with patient 50 minutes.       Ok Edwards NP [facility]  Contact [phone] Monday through Friday 8am- 5pm  After hours call  [phone]

## 2014-06-29 ENCOUNTER — Non-Acute Institutional Stay (SKILLED_NURSING_FACILITY): Payer: Medicare Other | Admitting: Internal Medicine

## 2014-06-29 ENCOUNTER — Encounter: Payer: Self-pay | Admitting: Internal Medicine

## 2014-06-29 DIAGNOSIS — F0390 Unspecified dementia without behavioral disturbance: Secondary | ICD-10-CM

## 2014-06-29 DIAGNOSIS — A528 Late syphilis, latent: Secondary | ICD-10-CM

## 2014-06-29 DIAGNOSIS — R569 Unspecified convulsions: Secondary | ICD-10-CM

## 2014-06-29 DIAGNOSIS — N400 Enlarged prostate without lower urinary tract symptoms: Secondary | ICD-10-CM

## 2014-06-29 DIAGNOSIS — I10 Essential (primary) hypertension: Secondary | ICD-10-CM

## 2014-06-29 DIAGNOSIS — I6340 Cerebral infarction due to embolism of unspecified cerebral artery: Secondary | ICD-10-CM

## 2014-06-29 NOTE — Progress Notes (Signed)
Patient ID: Cobain Morici, male   DOB: [birth year], 73 y.o.   MRN: [id number]     Facility: [facility]    PCP: Reginia Forts, MD  Code Status: full code  No Known Allergies  Chief Complaint  Patient presents with  . New Admit To SNF     HPI:  73 y/o male pt is here for STR post hospital admission from 06/13/14-06/19/14 with altered mental state and seizure. He was noted to have acute left PCA infarct on MRI of the brain. Neurology was consulted and he had stroke workup. TTE was negative but TEE showed a large PFO. He was put on full strength aspirin and statin. He had loop recorder placed. dvt was ruled out. He was started on keppra for the seizures. He also had dementia workup in the hospital where RPR was reactive. ID was then consulted and he underwent LP. CSF VDRL was negative. He was started on benzathine penicillin im weekly for total 3 weeks.  He has PMH of HTN, anemia. He is here for rehabilitation and seen in his room today. He is sitting on his recliner chair and denies any concerns. No new concerns from staff   Review of Systems:  Constitutional: Negative for fever, chills, malaise/fatigue and diaphoresis.  HENT: Negative for congestion Eyes: Negative for eye pain, blurred vision, double vision and discharge.  Respiratory: Negative for cough Cardiovascular: Negative for chest pain, palpitations, orthopnea and leg swelling.  Gastrointestinal: Negative for heartburn, nausea, vomiting, abdominal pain Genitourinary: Negative for dysuria Musculoskeletal: Negative for back pain, falls, joint pain and myalgias. Neurological: Negative for dizziness, tingling, focal weakness and headaches.  Psychiatric/Behavioral: Negative for depression  Past Medical History  Diagnosis Date  . Hypertension   . Tinnitus     Right Ear  . Anemia   . Abdominal wall hernia   . Personal history of colonic adenomas 04/14/2013  . 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  Seizures 06/13/2014   Past Surgical History  Procedure Laterality Date  . None    . Tee without cardioversion N/A 06/15/2014    Procedure: TRANSESOPHAGEAL ECHOCARDIOGRAM (TEE);  Surgeon: Fay Records, MD;  Location: [facility] ENDOSCOPY;  Service: Cardiovascular;  Laterality: N/A;   Social History:   reports that he has never smoked. He has never used smokeless tobacco. He reports that he does not drink alcohol or use illicit drugs.  Family History  Problem Relation Age of Onset  . Breast cancer Mother   . COPD Father   . Hypertension Sister   . Hypertension Brother   . Hypertension Sister   . Liver cancer Mother   . Lung cancer Mother   . Colon cancer Neg Hx   . Colon polyps Neg Hx   . Stomach cancer Neg Hx     Medications: Patient's Medications  New Prescriptions   No medications on file  Previous Medications   AMLODIPINE (NORVASC) 2.5 MG TABLET    Take 1 tablet (2.5 mg total) by mouth daily.   ASPIRIN 325 MG EC TABLET    Take 1 tablet (325 mg total) by mouth daily.   ATORVASTATIN (LIPITOR) 80 MG TABLET    Take 1 tablet (80 mg total) by mouth daily at 6 PM.   DONEPEZIL (ARICEPT) 5 MG TABLET    Take 5 mg by mouth at bedtime.   HYDROCHLOROTHIAZIDE (HYDRODIURIL) 25 MG TABLET    TAKE 1 TABLE  25 MG TOTAL BY MOUTH DAILY  NEEDS FOLLOW UP FOR ADDITION  REFILLS   LEVETIRACETAM (KEPPRA) 500 MG TABLET    Take 1 tablet (500 mg total) by mouth every 12 (twelve) hours.   PENICILLIN G BENZATHINE (BICILLIN LA) 1200000 UNIT/2ML SUSP INJECTION    Inject 4 mLs (2.4 Million Units total) into the muscle once a week.   TAMSULOSIN (FLOMAX) 0.4 MG CAPS CAPSULE    Take 1 capsule (0.4 mg total) by mouth daily after breakfast.  Modified Medications   No medications on file  Discontinued Medications   No medications on file     Physical Exam: Filed Vitals:   06/29/14 0852  BP: 131/84  Pulse: 71  Temp: 98.3 F (36.8 C)  Resp: 18  SpO2: 99%    General- elderly male in no acute distress Head-  atraumatic, normocephalic Eyes- no pallor, no icterus, no discharge Neck- no cervical lymphadenopathy Throat- moist mucus membrane Cardiovascular- normal s1,s2, no murmurs, normal dorsalis pedis Respiratory- bilateral clear to auscultation, no wheeze, no rhonchi, no crackles, no use of accessory muscles Abdomen- bowel sounds present, soft, non tender Musculoskeletal- able to move all 4 extremities, no leg edema Neurological- no focal deficit Skin- warm and dry Psychiatry- alert and oriented to person, place and time, normal mood and affect    Labs reviewed: Basic Metabolic Panel:  Recent Labs  06/12/14 1104 06/13/14 1135 06/19/14 1808  NA 140 139  --   K 4.8 4.5  --   CL 103 102  --   CO2 27 26  --   GLUCOSE 113* 145*  --   BUN 16 15  --   CREATININE 1.02 0.90 1.11  CALCIUM 9.2 9.1  --    Liver Function Tests:  Recent Labs  06/12/14 1104  AST 15  ALT 11  ALKPHOS 78  BILITOT 0.5  PROT 7.5  ALBUMIN 4.3   No results for input(s): LIPASE, AMYLASE in the last 8760 hours. No results for input(s): AMMONIA in the last 8760 hours. CBC:  Recent Labs  06/12/14 1107 06/13/14 1135 06/19/14 1808  WBC 8.0 8.7 10.4  NEUTROABS  --  7.5  --   HGB 12.6* 11.9* 13.1  HCT 40.2* 37.1* 40.4  MCV 75.2* 73.8* 72.4*  PLT  --  180 236   Cardiac Enzymes:  Recent Labs  06/13/14 1135  TROPONINI <0.30    Radiological Exams: 06-13-14: ct of head: 1. No acute intracranial findings.2. White matter microvascular disease. 3. Maxillary sinus inflammation  06-13-14: MRI/MRA: head: Areas of acute infarction in the left posterior cerebral artery territory affecting the occipital cortical and subcortical brain. There is some involvement of the hypothalamus adjacent to the atrium of the left lateral ventricle, which could relate to the seizure. No hemorrhage or mass effect. Extensive chronic small vessel ischemic changes elsewhere throughout the brain. No anterior circulation pathology  seen. Diminished flow within 1 of the 2 main left PCA branches distally  06-15-14: TEE: Left ventricle:  LVEF is normal.   Aortic valve:  AV is mildly thickened. No AI. Mitral valve:  MV is normal. Trace MR. Left atrium:  LA appendage without. masses. Atrial septum:  Large PFO as tested by color doppler and with injection of agitated saline with bubbles seen in L sided chambers. Pulmonic valve:   PV is normal. Tricuspid valve:  TV is normal MIld TR.  06-16-14:bilateral lower extremity doppler: - No evidence of deep vein or superficial thrombosis involving the right lower extremity and left lower extremity. - No evidence of Baker&'s cyst on the right or  left.  06-16-14: carotid doppler: Mild soft plaque at origins of both ICAs with mild 1-39% stenosis. Vertebral artery flow is antegrade bilaterally.   Assessment/Plan  Embolic stroke Neurologically stable, no focal deficit on exam. Continue aspirin and statin. Has loop recorder in place and to f/u with cardiology. Will have him work with physical therapy and occupational therapy team to help with gait training and muscle strengthening exercises.fall precautions. Skin care. Encourage to be out of bed.   Latent syphilis Complete 2 more wqeek of weekly im PCN  Seizure Likely in setting of his cva, continue keppra 500 mg bid, remains seizure free  Hypertension Stable, continue norvasc 2.5 mg daily and hctz 25 mg daily   BPH: continue flomax daily   Vascular dementia Stable bp. On asa and statin and on aricept 5 mg daily at present. Monitor clinically  Family/ staff Communication: reviewed care plan with patient and nursing supervisor  Goals of care: short term rehabilitation    Labs/tests ordered- cbc, cmp    Blanchie Serve, MD  [facility] [phone] (Monday-Friday 8 am - 5 pm) [phone] (afterhours)

## 2014-07-04 ENCOUNTER — Encounter: Payer: Self-pay | Admitting: Registered Nurse

## 2014-07-04 ENCOUNTER — Non-Acute Institutional Stay (SKILLED_NURSING_FACILITY): Payer: Medicare Other | Admitting: Registered Nurse

## 2014-07-04 DIAGNOSIS — N400 Enlarged prostate without lower urinary tract symptoms: Secondary | ICD-10-CM

## 2014-07-04 DIAGNOSIS — R569 Unspecified convulsions: Secondary | ICD-10-CM

## 2014-07-04 DIAGNOSIS — I10 Essential (primary) hypertension: Secondary | ICD-10-CM

## 2014-07-04 DIAGNOSIS — E785 Hyperlipidemia, unspecified: Secondary | ICD-10-CM

## 2014-07-04 DIAGNOSIS — I6340 Cerebral infarction due to embolism of unspecified cerebral artery: Secondary | ICD-10-CM

## 2014-07-04 DIAGNOSIS — F0390 Unspecified dementia without behavioral disturbance: Secondary | ICD-10-CM

## 2014-07-04 DIAGNOSIS — A528 Late syphilis, latent: Secondary | ICD-10-CM

## 2014-07-04 NOTE — Progress Notes (Signed)
Patient ID: Mark Soto, male   DOB: Sep 26, 1940, 73 y.o.   MRN: [id number]   Place of Service: [facility]  No Known Allergies  Code Status: Full Code  Goals of Care: Longevity/ STR  Chief Complaint  Patient presents with  . Discharge Note    HPI 73 y.o. male with PMH of HTN, BPH, and anemia is being seen for a discharge visit. Patient was here for short-term rehabilitation post hospitalization from 06/13/14 to 06/19/14 for seizures in setting of acute left PCA infarct, large PFO, and latent syphilis. He has worked with therapy team and is ready to be discharged home with [facility] PT/OT/ST, RN, and Aide.   Review of Systems Constitutional: Negative for fever and chills,  HENT: Negative for ear pain, congestion, and sore throat Eyes: Negative for eye pain, eye discharge, and visual disturbance  Cardiovascular: Negative for chest pain, palpitations, and leg swelling Respiratory: Negative cough, shortness of breath, and wheezing.  Gastrointestinal: Negative for nausea and vomiting. Negative for abdominal pain, diarrhea and constipation.  Genitourinary: Negative for  dysuria Musculoskeletal: Negative for back pain, joint pain, and joint swelling  Neurological: Negative for dizziness and headache Skin: Negative for rash and wound.   Psychiatric: Negative for depression  Past Medical History  Diagnosis Date  . Hypertension   . Tinnitus     Right Ear  . Anemia   . Abdominal wall hernia   . Personal history of colonic adenomas 04/14/2013  . 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 Seizures 06/13/2014    Past Surgical History  Procedure Laterality Date  . None    . Tee without cardioversion N/A 06/15/2014    Procedure: TRANSESOPHAGEAL ECHOCARDIOGRAM (TEE);  Surgeon: Fay Records, MD;  Location: [facility] ENDOSCOPY;  Service: Cardiovascular;  Laterality: N/A;    History   Social History  . Marital Status: Married    Spouse Name: N/A    Number of Children: N/A  . Years of  Education: N/A   Occupational History  . Not on file.   Social History Main Topics  . Smoking status: Never Smoker   . Smokeless tobacco: Never Used  . Alcohol Use: No  . Drug Use: No  . Sexual Activity: Not on file   Other Topics Concern  . Not on file   Social History Narrative   Lives with wife at home and 66 year old son. Son has special needs, sleep apnea, & severe seizure disorder. Wife has "many healthy problems". Another son in [place]. Daughter in [place].       3 sisters live in [place]. 1 brother in [place].       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  Always use your most recent med list.               amLODipine 2.5 MG tablet  Commonly known as:  NORVASC  Take 1 tablet (2.5 mg total) by mouth daily.     aspirin 325 MG EC tablet  Take 1 tablet (325 mg total) by mouth daily.     atorvastatin 80 MG tablet  Commonly known as:  LIPITOR  Take 1 tablet (80 mg total) by mouth daily at 6 PM.     donepezil 5 MG tablet  Commonly known as:  ARICEPT  Take 5  mg by mouth at bedtime.     hydrochlorothiazide 25 MG tablet  Commonly known as:  HYDRODIURIL  TAKE 1 TABLE  25 MG TOTAL BY MOUTH DAILY  NEEDS FOLLOW UP FOR ADDITION REFILLS     levETIRAcetam 500 MG tablet  Commonly known as:  KEPPRA  Take 1 tablet (500 mg total) by mouth every 12 (twelve) hours.     tamsulosin 0.4 MG Caps capsule  Commonly known as:  FLOMAX  Take 1 capsule (0.4 mg total) by mouth daily after breakfast.        Physical Exam Filed Vitals:   07/04/14 1444  BP: 136/86  Pulse: 64  Temp: 98.1 F (36.7 C)  Resp: 20   Constitutional: WDWN elderly male in no acute distress. Conversant and pleasant HEENT: Normocephalic and atraumatic. PERRL. EOM intact. No icterus. Wears eyeglasses.  No nasal discharge or sinus tenderness. Oral mucosa moist. Posterior pharynx clear of  any exudate or lesions.  Neck: Supple and nontender. No lymphadenopathy, masses, or thyromegaly. No JVD or carotid bruits. Cardiac: Normal S1, S2. RRR without appreciable murmurs, rubs, or gallops. Distal pulses intact. Trace pitting edema of BLE. Lungs: No respiratory distress. Breath sounds clear bilaterally without rales, rhonchi, or wheezes. Abdomen: Audible bowel sounds in all quadrants. Soft, nontender, nondistended.  Musculoskeletal: Normal range of motion. No joint erythema or tenderness.  Skin: Warm and dry. No rash noted. No erythema.  Neurological: Alert and oriented to person, place, and time. No focal deficits.  Psychiatric: Judgment and insight adequate. Appropriate mood and affect.   Labs Reviewed CBC Latest Ref Rng 06/19/2014 06/13/2014 06/12/2014  WBC 4.0 - 10.5 K/uL 10.4 8.7 8.0  Hemoglobin 13.0 - 17.0 g/dL 13.1 11.9(L) 12.6(A)  Hematocrit 39.0 - 52.0 % 40.4 37.1(L) 40.2(A)  Platelets 150 - 400 K/uL 236 180 -    CMP     Component Value Date/Time   NA 139 06/13/2014 1135   K 4.5 06/13/2014 1135   CL 102 06/13/2014 1135   CO2 26 06/13/2014 1135   GLUCOSE 145* 06/13/2014 1135   BUN 15 06/13/2014 1135   CREATININE 1.11 06/19/2014 1808   CREATININE 1.02 06/12/2014 1104   CALCIUM 9.1 06/13/2014 1135   PROT 7.5 06/12/2014 1104   ALBUMIN 4.3 06/12/2014 1104   AST 15 06/12/2014 1104   ALT 11 06/12/2014 1104   ALKPHOS 78 06/12/2014 1104   BILITOT 0.5 06/12/2014 1104   GFRNONAA 64* 06/19/2014 1808   GFRAA 75* 06/19/2014 1808    Lipid Panel     Component Value Date/Time   CHOL 171 06/14/2014 1030   TRIG 115 06/14/2014 1030   HDL 54 06/14/2014 1030   CHOLHDL 3.2 06/14/2014 1030   VLDL 23 06/14/2014 1030   LDLCALC 94 06/14/2014 1030    Diagnostic Studies Reviewed 06-13-14: ct of head: 1. No acute intracranial findings.2. White matter microvascular disease. 3. Maxillary sinus inflammation  06-13-14: MRI/MRA: head: Areas of acute infarction in the left posterior  cerebral artery territory affecting the occipital cortical and subcortical brain. There is some involvement of the hypothalamus adjacent to the atrium of the left lateral ventricle, which could relate to the seizure. No hemorrhage or mass effect. Extensive chronic small vessel ischemic changes elsewhere throughout the brain. No anterior circulation pathology seen. Diminished flow within 1 of the 2 main left PCA branches distally  06-15-14: TEE: Left ventricle: LVEF is normal.  Aortic valve: AV is mildly thickened. No AI. Mitral valve: MV is normal. Trace MR. Left atrium: LA  appendage without. masses. Atrial septum: Large PFO as tested by color doppler and with injection of agitated saline with bubbles seen in L sided chambers. Pulmonic valve:  PV is normal. Tricuspid valve: TV is normal MIld TR.  06-16-14:bilateral lower extremity doppler: - No evidence of deep vein or superficial thrombosis involving the right lower extremity and left lower extremity. - No evidence of Baker&'s cyst on the right or left.  06-16-14: carotid doppler: Mild soft plaque at origins of both ICAs with mild 1-39% stenosis. Vertebral artery flow is antegrade bilaterally.  06-22-14: CXR: borderline cardiomegaly w/o evidence of congestive heart failure. Negative for evident active pulmonary tuberculosis  Assessment & Plan 1. Essential hypertension, benign Stable. Continue hctz 25mg  daily and norvasc 2.5mg  daily.   2. Dementia, without behavioral disturbance Stable. Continue aricept 5mg  until 07/20/14 then 10mg  daily. Continue to work [place] for safety strategies and cognition  3. Late latent syphilis Completed his abx.   4. HLD (hyperlipidemia) Stable. Continue lipitor 80mg  daily  5. Seizure Remain seizure free. Continue keppra 500mg  bid.   6. BPH (benign prostatic hyperplasia) Continue flomax 0.4mg  daily   7. Cerebral embolism with cerebral infarction Neurologically stable. Continue low dose asa and  statin. Continue to work with [facility] PT/OT for strength/gait/balance training, RN for disease management, Aide for assistance with ADL care.    Home health services: PT/OT/ST, RN, Aide DME required: None PCP follow-up: Dr. Sonia Baller on 07/16/14 @ 4:00pm 30-day supply of prescription medications provided  Family/Staff Communication Plan of care discuss with patient nursing staff. Patient and nursing staff verbalize understanding and agree with plan of care. No additional questions or concerns reported.    Arthur Holms, MSN, AGNP-C [facility] [address] [place], [place] [address] [phone] [8am-5pm] After hours: [phone]

## 2014-07-09 ENCOUNTER — Encounter: Payer: Self-pay | Admitting: Internal Medicine

## 2014-07-09 ENCOUNTER — Telehealth: Payer: Self-pay

## 2014-07-09 NOTE — Telephone Encounter (Signed)
[facility] at [facility] wants an order from Dr. Tamala Julian to visit the patient in his home two times a week for two weeks, then to have follow up visits.   [phone]

## 2014-07-10 NOTE — Telephone Encounter (Signed)
Called [facility]- LM-we need to have pt follow up in office for orders to be sent in.  Pt has not been seen by Dr. Tamala Julian.

## 2014-07-16 ENCOUNTER — Encounter: Payer: Self-pay | Admitting: Family Medicine

## 2014-07-16 ENCOUNTER — Ambulatory Visit (INDEPENDENT_AMBULATORY_CARE_PROVIDER_SITE_OTHER): Payer: Medicare Other | Admitting: Family Medicine

## 2014-07-16 VITALS — BP 152/66 | HR 70 | Temp 98.5°F | Resp 16 | Ht 71.0 in | Wt 240.6 lb

## 2014-07-16 DIAGNOSIS — I10 Essential (primary) hypertension: Secondary | ICD-10-CM

## 2014-07-16 DIAGNOSIS — Q2112 Patent foramen ovale: Secondary | ICD-10-CM

## 2014-07-16 DIAGNOSIS — F0150 Vascular dementia without behavioral disturbance: Secondary | ICD-10-CM

## 2014-07-16 DIAGNOSIS — E785 Hyperlipidemia, unspecified: Secondary | ICD-10-CM

## 2014-07-16 DIAGNOSIS — R404 Transient alteration of awareness: Secondary | ICD-10-CM

## 2014-07-16 DIAGNOSIS — I6340 Cerebral infarction due to embolism of unspecified cerebral artery: Secondary | ICD-10-CM

## 2014-07-16 DIAGNOSIS — A528 Late syphilis, latent: Secondary | ICD-10-CM

## 2014-07-16 DIAGNOSIS — R7302 Impaired glucose tolerance (oral): Secondary | ICD-10-CM

## 2014-07-16 DIAGNOSIS — F0390 Unspecified dementia without behavioral disturbance: Secondary | ICD-10-CM

## 2014-07-16 DIAGNOSIS — Q211 Atrial septal defect: Secondary | ICD-10-CM

## 2014-07-16 DIAGNOSIS — N400 Enlarged prostate without lower urinary tract symptoms: Secondary | ICD-10-CM

## 2014-07-16 DIAGNOSIS — R569 Unspecified convulsions: Secondary | ICD-10-CM

## 2014-07-16 DIAGNOSIS — I639 Cerebral infarction, unspecified: Secondary | ICD-10-CM

## 2014-07-16 NOTE — Progress Notes (Addendum)
Subjective:    Patient ID: Mark Soto, male    DOB: 05-28-1941, 73 y.o.   MRN: [id number]  07/16/2014  Hospitalization Follow-up; Cerebrovascular Accident; Seizures; Dementia; and Benign Prostatic Hypertrophy   HPI This 73 y.o. male presents for TRANSITION INTO CARE:  1.  CVA:  ASA 325mg  and Lipitor 80mg  daily.  No dizziness; some peripheral vision deficits.  Not walking alone.  Good family support.  [facility]ceiving [facility] services.  S/p rehab stay. Carotid dopplers without significant stenosis; B ICA stenosis < 39% TEE with large PFO. LE doppler negative for DVT. CT head negative; white matter microvascular disease, MRI brain: +acute infarction L posterior cerebral artery involving hypothalamus which could explain seizure activity; diffuse chronic small vessel ischemic changes throughout.  2.  Seizure:  Not driving due to seizure.  Getting home health aide daily.  Taking Keppra bid.  No recurrent seizures since hospital discharge. S/p EEG during admission abnormal with epileptogenic focus involving L anterior temporal region.   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  HIV negative.  S/p lumbar puncture negative for neurosyphilis.  4. Patent PFO: loop recorder in place still; has not scheduled follow-up with cardiology.    5.  Vascular dementia: started Aricept during admission.  6.  BPH:  taking Flomax.  7:  HTN: taking HCTZ and Amlodipine.  Patient reports good compliance with medication, good tolerance to medication, and good symptom control.  Denies CP/palp/SOB; +chronic leg swelling.  8.  Hyperlipidemia: started on Lipitor 80mg  daily during admission.    9. Anxiety concerns:  Family expresses concerns regarding intermittent anxiety since discharge.    10.  Health Maintenance: Pneumovax administered on 06/18/2014.  Flu vaccine at pharmacy [facility].   Review of Systems  Constitutional: Negative for fever, chills, diaphoresis,  activity change, appetite change and fatigue.  Eyes: Positive for visual disturbance. Negative for photophobia.  Respiratory: Negative for cough and shortness of breath.   Cardiovascular: Negative for chest pain, palpitations and leg swelling.  Endocrine: Negative for cold intolerance, heat intolerance, polydipsia, polyphagia and polyuria.  Neurological: Negative for dizziness, tremors, seizures, syncope, facial asymmetry, speech difficulty, weakness, light-headedness, numbness and headaches.  Psychiatric/Behavioral: Negative for behavioral problems, confusion, dysphoric mood, decreased concentration and agitation. The patient is nervous/anxious.     Past Medical History  Diagnosis Date  . Hypertension   . Tinnitus     Right Ear  . Anemia   . Abdominal wall hernia   . Personal history of colonic adenomas 04/14/2013  . 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 Seizures 06/13/2014  . 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  . Hyperlipidemia   . BPH (benign prostatic hyperplasia)   . Glucose intolerance (impaired glucose tolerance)    Past Surgical History  Procedure Laterality Date  . None    . Tee without cardioversion N/A 06/15/2014    Procedure: TRANSESOPHAGEAL ECHOCARDIOGRAM (TEE);  Surgeon: Fay Records, MD;  Location: [facility] ENDOSCOPY;  Service: Cardiovascular;  Laterality: N/A;  . Loop recorder implant N/A 06/19/2014    Procedure: LOOP RECORDER IMPLANT;  Surgeon: Evans Lance, MD;  Location: [facility] CATH LAB;  Service: Cardiovascular;  Laterality: N/A;   No Known Allergies Current Outpatient Prescriptions  Medication Sig Dispense Refill  . aspirin 325 MG EC tablet Take 1 tablet (325 mg total) by mouth daily. 30 tablet 0  . amLODipine (NORVASC) 2.5 MG tablet Take 1 tablet (2.5 mg total) by  mouth daily. 30 tablet 5  . atorvastatin (LIPITOR) 80 MG tablet Take 1 tablet (80 mg total) by mouth daily at 6 PM. 30 tablet 5  . donepezil (ARICEPT) 5 MG tablet Take 1 tablet (5 mg total)  by mouth at bedtime. 90 tablet 0  . hydrochlorothiazide (HYDRODIURIL) 25 MG tablet Take 25 mg by mouth daily.    Marland Kitchen levETIRAcetam (KEPPRA) 500 MG tablet Take 1 tablet (500 mg total) by mouth every 12 (twelve) hours. 60 tablet 5  . tamsulosin (FLOMAX) 0.4 MG CAPS capsule Take 1 capsule (0.4 mg total) by mouth daily after breakfast. 30 capsule 11  . traMADol (ULTRAM-ER) 100 MG 24 hr tablet Take 100 mg by mouth daily. New medication per patient's wife (08/28/14)     No current facility-administered medications for this visit.   History   Social History  . Marital Status: Married    Spouse Name: N/A    Number of Children: N/A  . Years of Education: N/A   Occupational History  . Not on file.   Social History Main Topics  . Smoking status: Never Smoker   . Smokeless tobacco: Never Used  . Alcohol Use: No  . Drug Use: No  . Sexual Activity: Not on file   Other Topics Concern  . Not on file   Social History Narrative         Marital status:  Married x 43 years; first wife.      Children:   3 children; 5 grandchildren; 3 gg.      Lives: Lives with wife at home and 59 year old son. Son has special needs, sleep apnea, & severe seizure disorder. Wife has "many healthy problems". Another daughter in [place]. Daughter in [place].       Employment:  Retired at age 6.      Tobacco; none      Alcohol: rarely      Exercise:  none      3 sisters live in [place]. 1 brother in [place].       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 He appears well-developed and well-nourished. No distress.  HENT:  Head: Normocephalic and atraumatic.  Right Ear: External ear normal.  Left Ear: External ear normal.    Nose: Nose normal.  Mouth/Throat: Oropharynx is clear and moist.  Eyes: Conjunctivae and EOM are normal. Pupils are equal, round, and reactive to light.  Neck: Normal range of motion. Neck supple. Carotid bruit is not present. No thyromegaly present.  Cardiovascular: Normal rate, regular rhythm, normal heart sounds and intact distal pulses.  Exam reveals no gallop and no friction rub.   No murmur heard. 1+ pitting and non-pitting edema BLE to mid calf.  Pulmonary/Chest: Effort normal and breath sounds normal. He has no wheezes. He has no rales.  Abdominal: Soft. Bowel sounds are normal. He exhibits no distension and no mass. There is no tenderness. There is no rebound and no guarding.  Musculoskeletal: He exhibits edema.  Lymphadenopathy:    He has no cervical adenopathy.  Neurological: He is alert and oriented to person, place,  and time. No cranial nerve deficit. He exhibits normal muscle tone.  Skin: Skin is warm and dry. No rash noted. He is not diaphoretic.  Psychiatric: He has a normal mood and affect. His behavior is normal.  Nursing note and vitals reviewed.  Results for orders placed or performed during the hospital encounter of 16/10/96  Basic metabolic panel  Result Value Ref Range   Sodium 139 137 - 147 mEq/L   Potassium 4.5 3.7 - 5.3 mEq/L   Chloride 102 96 - 112 mEq/L   CO2 26 19 - 32 mEq/L   Glucose, Bld 145 (H) 70 - 99 mg/dL   BUN 15 6 - 23 mg/dL   Creatinine, Ser 0.90 0.50 - 1.35 mg/dL   Calcium 9.1 8.4 - 10.5 mg/dL   GFR calc non Af Amer 83 (L) >90 mL/min   GFR calc Af Amer >90 >90 mL/min   Anion gap 11 5 - 15  CBC with Differential  Result Value Ref Range   WBC 8.7 4.0 - 10.5 K/uL   RBC 5.03 4.22 - 5.81 MIL/uL   Hemoglobin 11.9 (L) 13.0 - 17.0 g/dL   HCT 37.1 (L) 39.0 - 52.0 %   MCV 73.8 (L) 78.0 - 100.0 fL   MCH 23.7 (L) 26.0 - 34.0 pg   MCHC 32.1 30.0 - 36.0 g/dL   RDW 16.0 (H) 11.5 - 15.5 %   Platelets 180 150 - 400 K/uL   Neutrophils Relative % 87 (H)  43 - 77 %   Neutro Abs 7.5 1.7 - 7.7 K/uL   Lymphocytes Relative 8 (L) 12 - 46 %   Lymphs Abs 0.7 0.7 - 4.0 K/uL   Monocytes Relative 5 3 - 12 %   Monocytes Absolute 0.4 0.1 - 1.0 K/uL   Eosinophils Relative 0 0 - 5 %   Eosinophils Absolute 0.0 0.0 - 0.7 K/uL   Basophils Relative 0 0 - 1 %   Basophils Absolute 0.0 0.0 - 0.1 K/uL  Urinalysis, Routine w reflex microscopic  Result Value Ref Range   Color, Urine YELLOW YELLOW   APPearance CLEAR CLEAR   Specific Gravity, Urine 1.017 1.005 - 1.030   pH 6.5 5.0 - 8.0   Glucose, UA NEGATIVE NEGATIVE mg/dL   Hgb urine dipstick SMALL (A) NEGATIVE   Bilirubin Urine NEGATIVE NEGATIVE   Ketones, ur NEGATIVE NEGATIVE mg/dL   Protein, ur NEGATIVE NEGATIVE mg/dL   Urobilinogen, UA 1.0 0.0 - 1.0 mg/dL   Nitrite NEGATIVE NEGATIVE   Leukocytes, UA NEGATIVE NEGATIVE  Troponin I  Result Value Ref Range   Troponin I <0.30 <0.30 ng/mL  RPR  Result Value Ref Range   RPR Ser Ql Reactive (A) NON REAC  Folate  Result Value Ref Range   Folate >20.0 ng/mL  Vitamin B12  Result Value Ref Range   Vitamin B-12 308 211 - 911 pg/mL  TSH  Result Value Ref Range   TSH 0.757 0.350 - 4.500 uIU/mL  HIV antibody  Result Value Ref Range   HIV 1&2 Ab, 4th Generation NONREACTIVE NONREACTIVE  Urine microscopic-add on  Result Value Ref Range   WBC, UA 0-2 <3 WBC/hpf   RBC / HPF 0-2 <3 RBC/hpf   Casts HYALINE CASTS (A) NEGATIVE   Urine-Other MUCOUS PRESENT   Rpr titer  Result Value Ref Range   RPR Titer 1:16 (A) NON REACTIVE  Fluorescent treponemal ab(fta)-IgG-bld  Result Value Ref Range   Fluorescent Treponemal AB, IgG Reactive (A)   Hemoglobin A1c  Result Value Ref Range   Hgb A1c MFr Bld 6.3 (H) <5.7 %   Mean Plasma Glucose 134 (H) <117 mg/dL  Lipid panel  Result Value Ref Range   Cholesterol 171 0 - 200 mg/dL   Triglycerides 115 <150 mg/dL   HDL 54 >39 mg/dL   Total CHOL/HDL Ratio 3.2 RATIO   VLDL 23 0 - 40 mg/dL   LDL Cholesterol 94 0 - 99  mg/dL  Fluorescent treponemal ab(fta)-IgG-bld  Result Value Ref Range   Fluorescent Treponemal AB, IgG Reactive (A)   VDRL, CSF  Result Value Ref Range   VDRL Quant, CSF Nonreactive Nonreactive  CSF cell count with differential collection tube #: 3  Result Value Ref Range   Tube # 3    Color, CSF COLORLESS COLORLESS   Appearance, CSF CLEAR CLEAR   Supernatant NOT INDICATED    RBC Count, CSF 0 0 /cu mm   WBC, CSF 1 0 - 5 /cu mm   Lymphs, CSF RARE 40 - 80 %   Monocyte-Macrophage-Spinal Fluid RARE 15 - 45 %   Other Cells, CSF TOO FEW TO COUNT, SMEAR AVAILABLE FOR REVIEW   Fluorescent treponemal ab(fta)-IgG-bld  Result Value Ref Range   Fluorescent Treponemal AB, IgG Reactive (A)   CBC  Result Value Ref Range   WBC 10.4 4.0 - 10.5 K/uL   RBC 5.58 4.22 - 5.81 MIL/uL   Hemoglobin 13.1 13.0 - 17.0 g/dL   HCT 40.4 39.0 - 52.0 %   MCV 72.4 (L) 78.0 - 100.0 fL   MCH 23.5 (L) 26.0 - 34.0 pg   MCHC 32.4 30.0 - 36.0 g/dL   RDW 15.8 (H) 11.5 - 15.5 %   Platelets 236 150 - 400 K/uL  Creatinine, serum  Result Value Ref Range   Creatinine, Ser 1.11 0.50 - 1.35 mg/dL   GFR calc non Af Amer 64 (L) >90 mL/min   GFR calc Af Amer 75 (L) >90 mL/min       Assessment & Plan:   1. Stroke   2. Seizure   3. PFO (patent foramen ovale)   4. Late latent syphilis   5. HLD (hyperlipidemia)   6. Essential hypertension, benign   7. Dementia, without behavioral disturbance   8. Cerebral embolism with cerebral infarction   9. BPH (benign prostatic hyperplasia)   10. Transient alteration of awareness   11. Glucose intolerance (impaired glucose tolerance)   12. Vascular dementia, without behavioral disturbance      1. CVA:  Acute L PCA stroke 06/2013; continue ASA.  Continue HH services.  Follow-up with neurology.  +PFO; LE doppler negative during admission. 2. Seizure activity:  New.  Secondary to acute CVA; no recurrent seizure activity on Keppra; appointment with neurology in upcoming six  weeks. 3.  PFO: with loop recorder still present; refer back to cardiology for removal and analysis. 4.  Latent syphilis: stable; s/p 3 PCN injections per rehab notes; will confirm that completed course of abx.  No evidence of neurosyphilis.  5.  Hyperlipidemia: stable; tolerating Lipitor daily; obtain labs next visit. 6.  HTN: moderately controlled; no changes to management at this time; if remains elevated at next visit, will increase Amlodipine dose. 7.  Dementia Vascular: stable; tolerating Aricept; follow-up neurology. 8.  BPH: stable on Flomax. 9. Glucose intolerance: HgbA1c of 6.2 during admission; monitor closely.    No orders of the defined types were placed in this encounter.    Return in about 2 months (around 09/16/2014)  for recheck.    Reginia Forts, M.D.  [facility] [address] [place], [place]  [address] [phone] phone [phone] fax

## 2014-07-16 NOTE — Progress Notes (Signed)
MRN: [id number] DOB: July 04, 1941  Subjective:   Mark Soto is a 73 y.o. male presenting for follow up s/p stroke with subsequent rehab in 06/2014, seizures, dementia, syphilis, HTN and BPH  Stroke - currently taking 325mg  of ASA daily, he will be getting home health aid daily, currently daughter is helping patient manage his medications and appointments. He has residual peripheral visual deficits but denies dizziness, speech deficits, weakness, loss of motor function. He is scheduled for follow up with Neuro in 08/2013. Daughter also plans on following up with Cardiology for his loop recorder which was placed during his admission.  Seizures - currently taking Keppra twice a day. Has not had recurrence since hospitalization. He is currently not driving and admits that this is the most difficult change for him but he is being compliant. Daughter is helping with transportation.  Dementia - taking Aricept. Has difficulty with his short term memory, at times forgets who he is talking to and has to take time to collect his thoughts and remember. Patient will get Home Health aid to help him go on accompanied walks. Has another appointment with Neuro for this as well in 08/2013.  Syphilis - received 2.4 units Penicillin IM during hospital admission but daughter is unsure of whether or not patient received 2 additional injections. Plans of contacting [facility] to confirm that he received the injections while he was there.  HTN - taking amlodipine, HCT daily. Reports compliance both with medication and diet. Admits some lower leg swelling. Denies dizziness, lightheadedness, chest pain, shob, palpitations, heart racing.   BPH - on Flomax, denies difficulty with urination, leaking, straining, weak flow, nocturia.  HL - taking simvastatin daily, denies myalgias.  Anxiety - patient admits that he has been worrying about all these major life changes. 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  Denies any other aggravating or relieving factors, no other questions or concerns.  Mr. Maksim Peregoy has a current medication list which includes the following prescription(s): amlodipine, aspirin, atorvastatin, donepezil, hydrochlorothiazide, levetiracetam, and tamsulosin.  Mr. Roen Macgowan has No Known Allergies.  Mr. Gid Schoffstall  has a past medical history of Hypertension; Tinnitus; Anemia; Abdominal wall hernia; Personal history of colonic adenomas (04/14/2013); Acute left PCA stroke (06/13/2014); and Seizures (06/13/2014). Also  has past surgical history that includes none and TEE without cardioversion (N/A, 06/15/2014).  ROS As in subjective.  Objective:   Vitals: BP 152/66 mmHg  Pulse 70  Temp(Src) 98.5 F (36.9 C) (Oral)  Resp 16  Ht 5\' 11"  (1.803 m)  Wt 240 lb 9.6 oz (109.135 kg)  BMI 33.57 kg/m2  SpO2 97%  BP Readings from Last 3 Encounters:  07/16/14 152/66  07/04/14 136/86  06/29/14 131/84   Physical Exam  Constitutional: He is oriented to person, place, and time and well-developed, well-nourished, and in no distress.  Cardiovascular: Normal rate, regular rhythm, normal heart sounds and intact distal pulses.  Exam reveals no gallop and no friction rub.   No murmur heard. Pulmonary/Chest: Effort normal and breath sounds normal. No respiratory distress. He has no wheezes. He has no rales.  Abdominal: Soft. Bowel sounds are normal. He exhibits no distension. There is no tenderness.  Musculoskeletal: Normal range of motion. He exhibits edema (1+ pitting edema bilaterally in lower extremities). He exhibits no tenderness.  Neurological: He is alert and oriented to person, place, and time.  Skin: Skin is warm and dry. No rash noted. He is not diaphoretic. No erythema.  Psychiatric: Mood and affect normal.   Assessment and Plan :   1. Stroke - stable, continue  ASA 325 daily, simvastatin, follow up with Neuro in 08/2013  2. Seizure - stable, continue keppra daily, modification of ADL's, continue with Home Health Aid, follow up with Neuro in 08/2013  3. PFO (patent foramen ovale) - stable, f/u with Cardiologist re: PFO and loop recorder, daughter to call back and let us know what the status is and/or if she needs a referral  4. Late latent syphilis - Unknown antibiotic course, daughter to contact [facility] and confirm penicillin injections  5. HLD (hyperlipidemia) - stable, continue statin, follow up in 3 months  6. Essential hypertension, benign - stable, continue amlodipine and HCT, dietary modifications, follow up in 3 months  7. Dementia, without behavioral disturbance - stable, continue Aricept, modification of ADL's, f/u with Neuro in 08/2013  8. Cerebral embolism with cerebral infarction - stable, f/u as above  9. BPH (benign prostatic hyperplasia) - stable, continue flomax, f/u in 3 months  10. Transient alteration of awareness - stable, f/u with Neuro in 08/2013   Jaynee Eagles, PA-C [facility] [phone] 07/16/2014 4:59 PM

## 2014-07-17 NOTE — Telephone Encounter (Signed)
Dr. Tamala Julian please advise home health visits two times a week for 2 weeks.

## 2014-07-17 NOTE — Telephone Encounter (Signed)
VO given to [place] for the home health visits.

## 2014-07-17 NOTE — Telephone Encounter (Signed)
Scott a Marine scientist for [employer] calling back to check to see if orders will now be sent for patient? Scott aware patient was seen at our office yesterday and just following up. His call back number is [phone]

## 2014-07-17 NOTE — Telephone Encounter (Signed)
Yes, approval for Home Health.

## 2014-07-18 ENCOUNTER — Telehealth: Payer: Self-pay

## 2014-07-18 ENCOUNTER — Encounter: Payer: Self-pay | Admitting: Family Medicine

## 2014-07-18 NOTE — Telephone Encounter (Signed)
Scott from [facility] calling to let our office know the skilled visits for patient will be delayed until Monday. Patients daughter requested they start Monday 07/23/14 do to her work schedule. Any questions please call Scott at [phone]

## 2014-07-19 ENCOUNTER — Encounter (HOSPITAL_COMMUNITY): Payer: Self-pay | Admitting: Internal Medicine

## 2014-07-19 NOTE — Telephone Encounter (Signed)
Noted  

## 2014-07-20 ENCOUNTER — Telehealth: Payer: Self-pay

## 2014-07-20 ENCOUNTER — Other Ambulatory Visit: Payer: Self-pay

## 2014-07-20 DIAGNOSIS — Q211 Atrial septal defect: Secondary | ICD-10-CM

## 2014-07-20 DIAGNOSIS — R569 Unspecified convulsions: Secondary | ICD-10-CM

## 2014-07-20 DIAGNOSIS — I6340 Cerebral infarction due to embolism of unspecified cerebral artery: Secondary | ICD-10-CM

## 2014-07-20 DIAGNOSIS — Q2112 Patent foramen ovale: Secondary | ICD-10-CM

## 2014-07-20 MED ORDER — LEVETIRACETAM 500 MG PO TABS: 500.0000 mg | ORAL_TABLET | Freq: Two times a day (BID) | ORAL | Status: DC

## 2014-07-20 MED ORDER — HYDROCHLOROTHIAZIDE 25 MG PO TABS: ORAL_TABLET | ORAL | Status: DC

## 2014-07-20 MED ORDER — AMLODIPINE BESYLATE 2.5 MG PO TABS: 2.5000 mg | ORAL_TABLET | Freq: Every day | ORAL | Status: DC

## 2014-07-20 MED ORDER — TAMSULOSIN HCL 0.4 MG PO CAPS: 0.4000 mg | ORAL_CAPSULE | Freq: Every day | ORAL | Status: DC

## 2014-07-20 MED ORDER — ATORVASTATIN CALCIUM 80 MG PO TABS: 80.0000 mg | ORAL_TABLET | Freq: Every day | ORAL | Status: DC

## 2014-07-20 MED ORDER — DONEPEZIL HCL 5 MG PO TABS: 5.0000 mg | ORAL_TABLET | Freq: Every day | ORAL | Status: DC

## 2014-07-20 NOTE — Telephone Encounter (Signed)
Called patient to remind him to get his flu shot.  Patient states he had his flu shot in November.

## 2014-07-20 NOTE — Telephone Encounter (Signed)
Completed.

## 2014-07-20 NOTE — Telephone Encounter (Signed)
PT with [facility] needs an order from Dr. Tamala Julian to OT 1 x week for 1 wk, then two times week for four weeks  Also needs a speech therapist for patient.   Verbal order is OKAY    [id number]  [facility]

## 2014-07-20 NOTE — Telephone Encounter (Signed)
mariam called on behalf of patient. Wants Dr. Tamala Julian to know that pt did receive the shots he was supposed to and is interested in being referred to the cardiologist like they discussed. Also have questions about rx refills and whether follow up appt with Dr. Tamala Julian is needed/available. Please advise. Cb # [phone]

## 2014-07-20 NOTE — Telephone Encounter (Signed)
Miriam notified. 

## 2014-07-20 NOTE — Telephone Encounter (Signed)
Pt did receive the shots for syphilis.   He needs refills on all of his medications. I have pended.  He would like to go to a cardiologist as discussed. Referral pended.

## 2014-07-22 NOTE — Telephone Encounter (Signed)
Please call [facility] and give verbal OK for OT and Speech therapy.

## 2014-07-23 NOTE — Telephone Encounter (Signed)
LM with VO for pt OT/speech therapy.

## 2014-07-24 ENCOUNTER — Telehealth: Payer: Self-pay

## 2014-07-24 NOTE — Telephone Encounter (Signed)
THIS MESSAGE IS FROM MARIA AT [facility]: SHE WOULD LIKE DR. Reginia Forts TO KNOW THAT MR. [place] PHYSICAL THERAPY TODAY. SHE SUGGEST HE HAS P.T. 2 TIMES A WEEK FOR 4 WEEKS. SHE JUST NEEDS THE VERBAL ORDER TO DO THIS. BEST PHONE [phone] ([place])   [place]

## 2014-07-25 NOTE — Telephone Encounter (Signed)
OK to proceed with PT as recommended.  Please advise [facility].

## 2014-07-26 ENCOUNTER — Telehealth: Payer: Self-pay

## 2014-07-26 NOTE — Telephone Encounter (Signed)
Mark Soto with Mark Soto  States that during therapy today pt of Dr. Thompson Caul  bp was slightly elevated at 150/84

## 2014-07-26 NOTE — Telephone Encounter (Signed)
Left detailed VM on Mark Soto's phone.

## 2014-07-26 NOTE — Telephone Encounter (Signed)
BP noted.  No change in management at this time.  OK to proceed with therapy.

## 2014-07-26 NOTE — Telephone Encounter (Signed)
Just so you are aware. They have you as PCP and looks like you saw pt last for his BP.

## 2014-08-01 ENCOUNTER — Telehealth: Payer: Self-pay

## 2014-08-01 NOTE — Telephone Encounter (Signed)
LM on Melanie cell phone- rtn call concerned about any cardiac complaints. LM for Mariam to rtn call.   Spoke to wife- she yelled at me stating that her husband is fine and not dying fast enough. I asked if he was doing ok today, we were concerned about his reported blood pressure. She states he is fine keeping her stressed out. She needs someone to call and check in on her. She states he is busy right now and he will call us if there is a concern. Wife hung up the phone.

## 2014-08-01 NOTE — Telephone Encounter (Signed)
°  MELANIE WANTED THE DR TO KNOW SHE TOOK PT'S BP AND IT WAS 160/82. PLEASE CALL HER AT [phone] IF NEEDED

## 2014-08-06 ENCOUNTER — Encounter: Payer: Self-pay | Admitting: Internal Medicine

## 2014-08-06 ENCOUNTER — Ambulatory Visit (INDEPENDENT_AMBULATORY_CARE_PROVIDER_SITE_OTHER): Payer: Medicare Other | Admitting: *Deleted

## 2014-08-06 DIAGNOSIS — I6340 Cerebral infarction due to embolism of unspecified cerebral artery: Secondary | ICD-10-CM

## 2014-08-06 LAB — MDC_IDC_ENUM_SESS_TYPE_REMOTE: MDC IDC SESS DTM: 20151210050500

## 2014-08-06 NOTE — Telephone Encounter (Signed)
Noted.  No changes to management at this time.

## 2014-08-07 ENCOUNTER — Encounter: Payer: Self-pay | Admitting: Internal Medicine

## 2014-08-08 NOTE — Progress Notes (Signed)
Loop recorder 

## 2014-08-13 ENCOUNTER — Telehealth: Payer: Self-pay | Admitting: Neurology

## 2014-08-13 ENCOUNTER — Telehealth: Payer: Self-pay

## 2014-08-13 NOTE — Telephone Encounter (Signed)
LM for rtn call. 

## 2014-08-13 NOTE — Telephone Encounter (Signed)
Mallory from  [facility] calling with elevated bp results 150/66 and would also like a verbal order for occular motor therapy,   Best phone for mallory is [phone]

## 2014-08-13 NOTE — Telephone Encounter (Signed)
Pt resch appt from 08-14-14 to 09-10-14

## 2014-08-14 ENCOUNTER — Ambulatory Visit: Payer: Medicare Other | Admitting: Neurology

## 2014-08-28 ENCOUNTER — Encounter: Payer: Self-pay | Admitting: Internal Medicine

## 2014-08-28 ENCOUNTER — Ambulatory Visit (INDEPENDENT_AMBULATORY_CARE_PROVIDER_SITE_OTHER): Payer: Medicare Other | Admitting: Internal Medicine

## 2014-08-28 VITALS — BP 126/74 | HR 70 | Ht 72.0 in | Wt 241.0 lb

## 2014-08-28 DIAGNOSIS — I10 Essential (primary) hypertension: Secondary | ICD-10-CM

## 2014-08-28 DIAGNOSIS — E785 Hyperlipidemia, unspecified: Secondary | ICD-10-CM

## 2014-08-28 DIAGNOSIS — I639 Cerebral infarction, unspecified: Secondary | ICD-10-CM

## 2014-08-28 LAB — MDC_IDC_ENUM_SESS_TYPE_INCLINIC
Date Time Interrogation Session: 20160119165611
MDC IDC SET ZONE DETECTION INTERVAL: 2000 ms
MDC IDC SET ZONE DETECTION INTERVAL: 380 ms
Zone Setting Detection Interval: 3000 ms

## 2014-08-28 NOTE — Progress Notes (Signed)
HPI Mr. Hoogendoorn returns today for followup of his ILR. 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 He had a negative workup and had an ILR inserted. He underwent rehab with improvement. He is deconditioned. He has generalized weakness. No other complaints. No palpitations. No Known Allergies   Current Outpatient Prescriptions  Medication Sig Dispense Refill  . amLODipine (NORVASC) 2.5 MG tablet Take 1 tablet (2.5 mg total) by mouth daily. 30 tablet 5  . aspirin 325 MG EC tablet Take 1 tablet (325 mg total) by mouth daily. 30 tablet 0  . atorvastatin (LIPITOR) 80 MG tablet Take 1 tablet (80 mg total) by mouth daily at 6 PM. 30 tablet 5  . donepezil (ARICEPT) 5 MG tablet Take 1 tablet (5 mg total) by mouth at bedtime. 90 tablet 0  . hydrochlorothiazide (HYDRODIURIL) 25 MG tablet Take 25 mg by mouth daily.    Marland Kitchen levETIRAcetam (KEPPRA) 500 MG tablet Take 1 tablet (500 mg total) by mouth every 12 (twelve) hours. 60 tablet 5  . tamsulosin (FLOMAX) 0.4 MG CAPS capsule Take 1 capsule (0.4 mg total) by mouth daily after breakfast. 30 capsule 11  . traMADol (ULTRAM-ER) 100 MG 24 hr tablet Take 100 mg by mouth daily. New medication per patient's wife (08/28/14)     No current facility-administered medications for this visit.     Past Medical History  Diagnosis Date  . Hypertension   . Tinnitus     Right Ear  . Anemia   . Abdominal wall hernia   . Personal history of colonic adenomas 04/14/2013  . 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 Seizures 06/13/2014    ROS:   All systems reviewed and negative except as noted in the HPI.   Past Surgical History  Procedure Laterality Date  . None    . Tee without cardioversion N/A 06/15/2014    Procedure: TRANSESOPHAGEAL ECHOCARDIOGRAM (TEE);  Surgeon: Fay Records, MD;  Location: [facility] ENDOSCOPY;  Service: Cardiovascular;  Laterality: N/A;  . Loop recorder implant N/A 06/19/2014    Procedure: LOOP RECORDER  IMPLANT;  Surgeon: Evans Lance, MD;  Location: [facility] CATH LAB;  Service: Cardiovascular;  Laterality: N/A;     Family History  Problem Relation Age of Onset  . Breast cancer Mother   . COPD Father   . Hypertension Sister   . Hypertension Brother   . Hypertension Sister   . Liver cancer Mother   . Lung cancer Mother   . Colon cancer Neg Hx   . Colon polyps Neg Hx   . Stomach cancer Neg Hx      History   Social History  . Marital Status: Married    Spouse Name: N/A    Number of Children: N/A  . Years of Education: N/A   Occupational History  . Not on file.   Social History Main Topics  . Smoking status: Never Smoker   . Smokeless tobacco: Never Used  . Alcohol Use: No  . Drug Use: No  . Sexual Activity: Not on file   Other Topics Concern  . Not on file   Social History Narrative         Marital status:  Married x 43 years; first wife.      Children:   3 children; 5 grandchildren; 3 gg.      Lives: Lives with wife at home and 62 year old son. Son has special  needs, sleep apnea, & severe seizure disorder. Wife has "many healthy problems". Another daughter in [place]. Daughter in [place].       Employment:  Retired at age 70.      Tobacco; none      Alcohol: rarely      Exercise:  none      3 sisters live in [place]. 1 brother in [place].       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 ILR demonstrates no atrial fib  Assess/Plan:

## 2014-08-28 NOTE — Assessment & Plan Note (Signed)
The etiology of his stroke is unclear. He will continue monitoring with his ILR. I suspect we will find PAF.

## 2014-08-28 NOTE — Assessment & Plan Note (Signed)
His blood pressure is well controlled. Continue current meds. 

## 2014-08-28 NOTE — Assessment & Plan Note (Signed)
Continue statin therapy.

## 2014-08-28 NOTE — Patient Instructions (Signed)
Your physician wants you to follow-up in: 6 months with Dr. Lovena Le. You will receive a reminder letter in the mail two months in advance. If you don't receive a letter, please call our office to schedule the follow-up appointment.  Your physician recommends that you continue on your current medications as directed. Please refer to the Current Medication list given to you today.

## 2014-09-04 ENCOUNTER — Encounter: Payer: Self-pay | Admitting: Neurology

## 2014-09-04 ENCOUNTER — Ambulatory Visit (INDEPENDENT_AMBULATORY_CARE_PROVIDER_SITE_OTHER): Payer: Medicare Other | Admitting: Neurology

## 2014-09-04 ENCOUNTER — Encounter: Payer: Self-pay | Admitting: Family Medicine

## 2014-09-04 VITALS — BP 130/63 | HR 80 | Ht 72.0 in | Wt 243.0 lb

## 2014-09-04 DIAGNOSIS — A530 Latent syphilis, unspecified as early or late: Secondary | ICD-10-CM

## 2014-09-04 DIAGNOSIS — E785 Hyperlipidemia, unspecified: Secondary | ICD-10-CM

## 2014-09-04 DIAGNOSIS — R7302 Impaired glucose tolerance (oral): Secondary | ICD-10-CM | POA: Insufficient documentation

## 2014-09-04 DIAGNOSIS — I6340 Cerebral infarction due to embolism of unspecified cerebral artery: Secondary | ICD-10-CM

## 2014-09-04 DIAGNOSIS — I10 Essential (primary) hypertension: Secondary | ICD-10-CM

## 2014-09-04 DIAGNOSIS — F0150 Vascular dementia without behavioral disturbance: Secondary | ICD-10-CM | POA: Insufficient documentation

## 2014-09-04 DIAGNOSIS — R569 Unspecified convulsions: Secondary | ICD-10-CM | POA: Insufficient documentation
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
## 2014-09-04 NOTE — Patient Instructions (Signed)
-   continue ASA and lipitor for stroke prevention - continue keppra for seizure  - no driving at this time due to seizure and visual field deficit. - will do TCD emboli detection - will give you more information regarding RESPECT ESUS trial to consider - continue aricept for MCI - check BP at home - recommend to have eye doctor follow up for visual field testing and monitoring - Follow up with your primary care physician for stroke risk factor modification. Recommend maintain blood pressure goal <130/80, diabetes with hemoglobin A1c goal below 6.5% and lipids with LDL cholesterol goal below 70 mg/dL.  - follow up in 3 months.

## 2014-09-05 ENCOUNTER — Ambulatory Visit (INDEPENDENT_AMBULATORY_CARE_PROVIDER_SITE_OTHER): Payer: Medicare Other | Admitting: *Deleted

## 2014-09-05 DIAGNOSIS — I639 Cerebral infarction, unspecified: Secondary | ICD-10-CM

## 2014-09-06 ENCOUNTER — Other Ambulatory Visit: Payer: Self-pay | Admitting: Family Medicine

## 2014-09-06 NOTE — Progress Notes (Signed)
Loop recorder 

## 2014-09-10 ENCOUNTER — Encounter: Payer: Self-pay | Admitting: Neurology

## 2014-09-10 ENCOUNTER — Encounter: Payer: Self-pay | Admitting: Family Medicine

## 2014-09-10 ENCOUNTER — Ambulatory Visit (INDEPENDENT_AMBULATORY_CARE_PROVIDER_SITE_OTHER): Payer: Medicare Other | Admitting: Family Medicine

## 2014-09-10 ENCOUNTER — Ambulatory Visit (INDEPENDENT_AMBULATORY_CARE_PROVIDER_SITE_OTHER): Payer: Medicare Other | Admitting: Neurology

## 2014-09-10 VITALS — BP 150/70 | HR 71 | Resp 18 | Ht 72.0 in | Wt 239.0 lb

## 2014-09-10 VITALS — BP 138/68 | HR 67 | Temp 97.6°F | Resp 16 | Ht 71.0 in | Wt 236.2 lb

## 2014-09-10 DIAGNOSIS — R569 Unspecified convulsions: Secondary | ICD-10-CM

## 2014-09-10 DIAGNOSIS — E785 Hyperlipidemia, unspecified: Secondary | ICD-10-CM

## 2014-09-10 DIAGNOSIS — I10 Essential (primary) hypertension: Secondary | ICD-10-CM

## 2014-09-10 DIAGNOSIS — R7302 Impaired glucose tolerance (oral): Secondary | ICD-10-CM

## 2014-09-10 DIAGNOSIS — E669 Obesity, unspecified: Secondary | ICD-10-CM

## 2014-09-10 DIAGNOSIS — F0150 Vascular dementia without behavioral disturbance: Secondary | ICD-10-CM

## 2014-09-10 DIAGNOSIS — Z8673 Personal history of transient ischemic attack (TIA), and cerebral infarction without residual deficits: Secondary | ICD-10-CM

## 2014-09-10 DIAGNOSIS — Q2112 Patent foramen ovale: Secondary | ICD-10-CM

## 2014-09-10 DIAGNOSIS — Q211 Atrial septal defect: Secondary | ICD-10-CM

## 2014-09-10 DIAGNOSIS — A530 Latent syphilis, unspecified as early or late: Secondary | ICD-10-CM

## 2014-09-10 DIAGNOSIS — I6340 Cerebral infarction due to embolism of unspecified cerebral artery: Secondary | ICD-10-CM

## 2014-09-10 DIAGNOSIS — H53453 Other localized visual field defect, bilateral: Secondary | ICD-10-CM

## 2014-09-10 LAB — COMPREHENSIVE METABOLIC PANEL
ALK PHOS: 75 U/L (ref 39–117)
ALT: 22 U/L (ref 0–53)
AST: 20 U/L (ref 0–37)
Albumin: 4 g/dL (ref 3.5–5.2)
BUN: 11 mg/dL (ref 6–23)
CO2: 26 mEq/L (ref 19–32)
CREATININE: 0.84 mg/dL (ref 0.50–1.35)
Calcium: 9.2 mg/dL (ref 8.4–10.5)
Chloride: 102 mEq/L (ref 96–112)
Glucose, Bld: 93 mg/dL (ref 70–99)
Potassium: 3.8 mEq/L (ref 3.5–5.3)
Sodium: 138 mEq/L (ref 135–145)
Total Bilirubin: 0.6 mg/dL (ref 0.2–1.2)
Total Protein: 6.8 g/dL (ref 6.0–8.3)

## 2014-09-10 LAB — CBC WITH DIFFERENTIAL/PLATELET
BASOS ABS: 0 10*3/uL (ref 0.0–0.1)
Basophils Relative: 0 % (ref 0–1)
EOS ABS: 0 10*3/uL (ref 0.0–0.7)
Eosinophils Relative: 0 % (ref 0–5)
HCT: 34.1 % — ABNORMAL LOW (ref 39.0–52.0)
HEMOGLOBIN: 10.9 g/dL — AB (ref 13.0–17.0)
LYMPHS PCT: 24 % (ref 12–46)
Lymphs Abs: 1.8 10*3/uL (ref 0.7–4.0)
MCH: 23.4 pg — AB (ref 26.0–34.0)
MCHC: 32 g/dL (ref 30.0–36.0)
MCV: 73.2 fL — AB (ref 78.0–100.0)
MONOS PCT: 7 % (ref 3–12)
Monocytes Absolute: 0.5 10*3/uL (ref 0.1–1.0)
NEUTROS ABS: 5 10*3/uL (ref 1.7–7.7)
Neutrophils Relative %: 69 % (ref 43–77)
PLATELETS: 224 10*3/uL (ref 150–400)
RBC: 4.66 MIL/uL (ref 4.22–5.81)
RDW: 17.5 % — ABNORMAL HIGH (ref 11.5–15.5)
WBC: 7.3 10*3/uL (ref 4.0–10.5)

## 2014-09-10 LAB — LIPID PANEL
CHOLESTEROL: 113 mg/dL (ref 0–200)
HDL: 46 mg/dL (ref >39)
LDL CALC: 55 mg/dL (ref 0–99)
Total CHOL/HDL Ratio: 2.5 Ratio
Triglycerides: 59 mg/dL (ref <150)
VLDL: 12 mg/dL (ref 0–40)

## 2014-09-10 MED ORDER — DONEPEZIL HCL 10 MG PO TABS
ORAL_TABLET | ORAL | Status: DC
Start: 2014-09-10 — End: 2015-07-15

## 2014-09-10 NOTE — Patient Instructions (Signed)
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
## 2014-09-10 NOTE — Patient Instructions (Signed)
1. Increase Aricept to 10mg  daily 2. Physical exercise and brain stimulation exercises are important for brain health 3. Follow-up in 3 months

## 2014-09-11 ENCOUNTER — Encounter: Payer: Self-pay | Admitting: Neurology

## 2014-09-11 DIAGNOSIS — Z8673 Personal history of transient ischemic attack (TIA), and cerebral infarction without residual deficits: Secondary | ICD-10-CM | POA: Insufficient documentation

## 2014-09-11 DIAGNOSIS — E669 Obesity, unspecified: Secondary | ICD-10-CM | POA: Insufficient documentation

## 2014-09-19 ENCOUNTER — Other Ambulatory Visit: Payer: Medicare Other

## 2014-09-21 ENCOUNTER — Ambulatory Visit (INDEPENDENT_AMBULATORY_CARE_PROVIDER_SITE_OTHER): Payer: Medicare Other

## 2014-09-21 DIAGNOSIS — I6340 Cerebral infarction due to embolism of unspecified cerebral artery: Secondary | ICD-10-CM

## 2014-09-21 LAB — MDC_IDC_ENUM_SESS_TYPE_REMOTE

## 2014-09-24 ENCOUNTER — Telehealth: Payer: Self-pay
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
## 2014-09-24 NOTE — Telephone Encounter (Signed)
I checked the box at 102 and searched through the media tab to see if it was scanned in. I don't see anything relating to this. Dr. Tamala Julian do you remember receiving this paperwork?

## 2014-09-25 NOTE — Telephone Encounter (Signed)
I have completed paperwork and have mailed it to patient. Family should be receiving all paperwork in mail in upcoming few days.  Please advise daughter.

## 2014-09-26 NOTE — Telephone Encounter (Signed)
Spoke with pt's daughter and advised paperwork was mailed to them. Daughter understood.

## 2014-09-28 ENCOUNTER — Telehealth: Payer: Self-pay
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
## 2014-10-02 ENCOUNTER — Encounter: Payer: Self-pay | Admitting: Internal Medicine

## 2014-10-05 ENCOUNTER — Encounter: Payer: Self-pay | Admitting: Cardiology

## 2014-10-05 ENCOUNTER — Ambulatory Visit (INDEPENDENT_AMBULATORY_CARE_PROVIDER_SITE_OTHER): Payer: Medicare Other | Admitting: *Deleted

## 2014-10-05 DIAGNOSIS — I639 Cerebral infarction, unspecified: Secondary | ICD-10-CM

## 2014-10-05 LAB — MDC_IDC_ENUM_SESS_TYPE_REMOTE

## 2014-10-05 NOTE — Progress Notes (Signed)
Loop recorder 

## 2014-10-10 ENCOUNTER — Ambulatory Visit: Payer: Medicare Other | Admitting: Family Medicine

## 2014-10-12 ENCOUNTER — Encounter: Payer: Self-pay | Admitting: Cardiology

## 2014-10-19 ENCOUNTER — Encounter: Payer: Self-pay | Admitting: Cardiology

## 2014-10-26 ENCOUNTER — Encounter: Payer: Self-pay | Admitting: Cardiology

## 2014-10-30 ENCOUNTER — Encounter: Payer: Self-pay | Admitting: Internal Medicine

## 2014-11-02 ENCOUNTER — Encounter: Payer: Self-pay | Admitting: Cardiology

## 2014-11-13 ENCOUNTER — Telehealth: Payer: Self-pay | Admitting: Internal Medicine

## 2014-11-13 NOTE — Telephone Encounter (Signed)
LMTCB/SSS 
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
## 2014-11-13 NOTE — Telephone Encounter (Signed)
Instructed pt daughter that she will need to call [employer] tech service to order new monitor. Pt daughter verbalized understanding.

## 2014-11-16 ENCOUNTER — Encounter: Payer: Self-pay | Admitting: Cardiology

## 2014-12-10 ENCOUNTER — Encounter: Payer: Self-pay | Admitting: Family Medicine

## 2014-12-10 ENCOUNTER — Other Ambulatory Visit: Payer: Self-pay | Admitting: Family Medicine

## 2014-12-10 ENCOUNTER — Ambulatory Visit (INDEPENDENT_AMBULATORY_CARE_PROVIDER_SITE_OTHER): Payer: Medicare Other | Admitting: Family Medicine

## 2014-12-10 ENCOUNTER — Ambulatory Visit: Payer: Medicare Other | Admitting: Neurology

## 2014-12-10 VITALS — BP 138/66 | HR 74 | Temp 98.3°F | Resp 16 | Ht 71.0 in | Wt 237.6 lb

## 2014-12-10 DIAGNOSIS — R0683 Snoring: Secondary | ICD-10-CM | POA: Diagnosis not present

## 2014-12-10 DIAGNOSIS — Z029 Encounter for administrative examinations, unspecified: Secondary | ICD-10-CM

## 2014-12-10 DIAGNOSIS — I10 Essential (primary) hypertension: Secondary | ICD-10-CM

## 2014-12-10 DIAGNOSIS — Z8673 Personal history of transient ischemic attack (TIA), and cerebral infarction without residual deficits: Secondary | ICD-10-CM

## 2014-12-10 DIAGNOSIS — F0150 Vascular dementia without behavioral disturbance: Secondary | ICD-10-CM | POA: Diagnosis not present

## 2014-12-10 DIAGNOSIS — F329 Major depressive disorder, single episode, unspecified: Secondary | ICD-10-CM

## 2014-12-10 DIAGNOSIS — Z1211 Encounter for screening for malignant neoplasm of colon: Secondary | ICD-10-CM | POA: Diagnosis not present

## 2014-12-10 DIAGNOSIS — R7302 Impaired glucose tolerance (oral): Secondary | ICD-10-CM

## 2014-12-10 DIAGNOSIS — F418 Other specified anxiety disorders: Secondary | ICD-10-CM

## 2014-12-10 DIAGNOSIS — R569 Unspecified convulsions: Secondary | ICD-10-CM | POA: Diagnosis not present

## 2014-12-10 DIAGNOSIS — D649 Anemia, unspecified: Secondary | ICD-10-CM | POA: Diagnosis not present

## 2014-12-10 DIAGNOSIS — F419 Anxiety disorder, unspecified: Secondary | ICD-10-CM

## 2014-12-10 LAB — IFOBT (OCCULT BLOOD): IFOBT: NEGATIVE

## 2014-12-10 LAB — POCT CBC
Granulocyte percent: 73.2 %G (ref 37–80)
HCT, POC: 36 % — AB (ref 43.5–53.7)
Hemoglobin: 11.5 g/dL — AB (ref 14.1–18.1)
LYMPH, POC: 1.4 (ref 0.6–3.4)
MCH: 23.6 pg — AB (ref 27–31.2)
MCHC: 32 g/dL (ref 31.8–35.4)
MCV: 74 fL — AB (ref 80–97)
MID (cbc): 0.5 (ref 0–0.9)
MPV: 9.1 fL (ref 0–99.8)
PLATELET COUNT, POC: 212 10*3/uL (ref 142–424)
POC GRANULOCYTE: 5.1 (ref 2–6.9)
POC LYMPH %: 20.3 % (ref 10–50)
POC MID %: 6.5 % (ref 0–12)
RBC: 4.86 M/uL (ref 4.69–6.13)
RDW, POC: 17.2 %
WBC: 7 10*3/uL (ref 4.6–10.2)

## 2014-12-10 LAB — COMPREHENSIVE METABOLIC PANEL
ALT: 11 U/L (ref 0–53)
AST: 14 U/L (ref 0–37)
Albumin: 3.9 g/dL (ref 3.5–5.2)
Alkaline Phosphatase: 79 U/L (ref 39–117)
BUN: 14 mg/dL (ref 6–23)
CO2: 29 mEq/L (ref 19–32)
Calcium: 9.1 mg/dL (ref 8.4–10.5)
Chloride: 103 mEq/L (ref 96–112)
Creat: 0.9 mg/dL (ref 0.50–1.35)
Glucose, Bld: 122 mg/dL — ABNORMAL HIGH (ref 70–99)
Potassium: 4.3 mEq/L (ref 3.5–5.3)
SODIUM: 142 meq/L (ref 135–145)
TOTAL PROTEIN: 7.1 g/dL (ref 6.0–8.3)
Total Bilirubin: 0.5 mg/dL (ref 0.2–1.2)

## 2014-12-10 LAB — HEMOGLOBIN A1C
Hgb A1c MFr Bld: 6.7 % — ABNORMAL HIGH (ref <5.7)
Mean Plasma Glucose: 146 mg/dL — ABNORMAL HIGH (ref <117)

## 2014-12-10 MED ORDER — FLUOXETINE HCL (PMDD) 20 MG PO CAPS: 20.0000 mg | ORAL_CAPSULE | Freq: Every day | ORAL | Status: DC

## 2014-12-10 NOTE — Progress Notes (Signed)
Subjective:    Patient ID: Mark Soto, male    DOB: May 18, 1941, 74 y.o.   MRN: [id number]  12/10/2014  Follow-up and Hypertension   HPI This 74 y.o. male presents for three month follow-up:  1. HTN:  No changes to management made at last visit.  Patient reports good compliance with medication, good tolerance to medication, and good symptom control.  Checks BP at home; running "good."  Denies CP/palp/SOB; +leg swelling continues but at baseline.   2.  CVA: no residual weakness from stroke.  No seizure activity; follow up with neurology today.  S/p PT, OT, nursing at home.  No exercise.  Sits around a lot; wife very worried about sedentary lifestyle.  Never wants to go anywhere.  Pt very angry about not being able to drive; pt has accepted this but is very disappointed. Has started cooking again; wife does not cook.  Wife states that pt has always had sedentary lifestyle even before stroke but now is not exercising at all.  Watches TV all day long.  Wife thinks that pt should see a psychiatrist.  Not very social.    3.  Seizure activity:  No seizure activity since last visit.  Patient reports good compliance with medication, good tolerance to medication, and good symptom control.  Appointment with neurology today.  Not driving.  4.  Hypercholesterolemia: Patient reports good compliance with medication, good tolerance to medication, and good symptom control.   FLP under good control at last visit.  5. BPH:  Patient reports good compliance with medication, good tolerance to medication, and good symptom control.  .  6. Glucose Intolerance: weight stable; has not gained weight; avoiding sweets and excessive bread intake.  7. Snoring/fatigue: tired a lot; no previous sleep study; +snores.  Wife and son with OSA on CPAP.    8.  Anemia: hgb had dropped to 10.6 at last visit; pt was contacted and advised to return within two weeks for repeat labs; pt non-compliant with follow-up.  Denies  bloody stools or melena. Denies n/v/d/c; denies abdominal pain. Colonoscopy UTD in 2014.   Review of Systems  Constitutional: Positive for fatigue. Negative for fever, chills, diaphoresis, activity change and appetite change.  Respiratory: Positive for apnea. Negative for cough and shortness of breath.   Cardiovascular: Negative for chest pain, palpitations and leg swelling.  Gastrointestinal: Negative for nausea, vomiting, abdominal pain, diarrhea, constipation, blood in stool, abdominal distention, anal bleeding and rectal pain.  Endocrine: Negative for cold intolerance, heat intolerance, polydipsia, polyphagia and polyuria.  Skin: Negative for color change, rash and wound.  Neurological: Negative for dizziness, tremors, seizures, syncope, facial asymmetry, speech difficulty, weakness, light-headedness, numbness and headaches.  Psychiatric/Behavioral: Positive for dysphoric mood. Negative for sleep disturbance. The patient is not nervous/anxious.     Past Medical History  Diagnosis Date  . Hypertension   . Tinnitus     Right Ear  . Anemia   . Abdominal wall hernia   . Personal history of colonic adenomas 04/14/2013  . 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 Seizures 06/13/2014  . 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  . Hyperlipidemia   . BPH (benign prostatic hyperplasia)   . Glucose intolerance (impaired glucose tolerance)    Past Surgical History  Procedure Laterality Date  . None    . Tee without cardioversion N/A 06/15/2014    Procedure: TRANSESOPHAGEAL ECHOCARDIOGRAM (TEE);  Surgeon: Fay Records, MD;  Location: [facility];  Service: Cardiovascular;  Laterality: N/A;  . Loop recorder implant N/A 06/19/2014    Procedure: LOOP RECORDER IMPLANT;  Surgeon: Evans Lance, MD;  Location: [facility] CATH LAB;  Service: Cardiovascular;  Laterality: N/A;   No Known Allergies Current Outpatient Prescriptions  Medication Sig Dispense Refill  . amLODipine (NORVASC) 2.5 MG  tablet Take 1 tablet (2.5 mg total) by mouth daily. 30 tablet 5  . aspirin 325 MG EC tablet Take 1 tablet (325 mg total) by mouth daily. 30 tablet 0  . atorvastatin (LIPITOR) 80 MG tablet Take 1 tablet (80 mg total) by mouth daily at 6 PM. 30 tablet 5  . donepezil (ARICEPT) 10 MG tablet Take 1 tablet daily 90 tablet 3  . hydrochlorothiazide (HYDRODIURIL) 25 MG tablet Take 25 mg by mouth daily.    Marland Kitchen levETIRAcetam (KEPPRA) 500 MG tablet Take 1 tablet (500 mg total) by mouth every 12 (twelve) hours. 60 tablet 5  . tamsulosin (FLOMAX) 0.4 MG CAPS capsule Take 1 capsule (0.4 mg total) by mouth daily after breakfast. 30 capsule 11  . Fluoxetine HCl, PMDD, 20 MG CAPS Take 1 capsule (20 mg total) by mouth daily. 30 each 5   No current facility-administered medications for this visit.   History   Social History  . Marital Status: Married    Spouse Name: N/A  . Number of Children: N/A  . Years of Education: N/A   Occupational History  . Not on file.   Social History Main Topics  . Smoking status: Never Smoker   . Smokeless tobacco: Never Used  . Alcohol Use: No  . Drug Use: No  . Sexual Activity: Not on file   Other Topics Concern  . Not on file   Social History Narrative         Marital status:  Married x 43 years; first wife.      Children:   3 children; 5 grandchildren; 3 gg.      Lives: Lives with wife at home and 7 year old son. Son has special needs, sleep apnea, & severe seizure disorder. Wife has "many healthy problems". Another daughter in [place]. Daughter in [place].       Employment:  Retired at age 16.      Tobacco; none      Alcohol: rarely      Exercise:  none      3 sisters live in [place]. 1 brother in [place].       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 He appears well-developed and well-nourished. No distress.  HENT:  Head: Normocephalic and atraumatic.  Right Ear: External ear normal.  Left Ear: External ear normal.  Nose: Nose normal.  Mouth/Throat: Oropharynx is clear and moist.  Eyes: Conjunctivae and EOM are normal. Pupils are equal, round, and reactive to light.  Neck: Normal range of motion. Neck supple. Carotid bruit is not present. No thyromegaly present.  Cardiovascular: Normal rate, regular rhythm, normal heart sounds and intact distal pulses.  Exam reveals no gallop and no friction rub.   No murmur heard. Pulmonary/Chest: Effort normal and breath sounds normal.  He has no wheezes. He has no rales.  Abdominal: Soft. Bowel sounds are normal. He exhibits no distension and no mass. There is no tenderness. There is no rebound and no guarding.  Genitourinary: Rectum normal.  Lymphadenopathy:    He has no cervical adenopathy.  Neurological: He is alert and oriented to person, place, and time. No cranial nerve deficit.  Skin: Skin is warm and dry. No rash noted. He is not diaphoretic.  Psychiatric: He has a normal mood and affect. His behavior is normal.  Nursing note and vitals reviewed.       Assessment & Plan:   1. Anemia, unspecified anemia type   2. Glucose intolerance (impaired glucose tolerance)   3. Essential hypertension, benign   4. Snoring   5. Colon cancer screening   6. Anxiety and depression   7. Vascular dementia, without behavioral disturbance   8. Seizures   9. History of CVA (cerebrovascular accident)     1.  Anemia:  New.  Repeat with improvement; hemosure negative; colonoscopy UTD.   2.  Glucose intolerance: stable; obtain labs; continue with dietary modification and weight loss; recommend daily exercise. 3.  HTN: controlled; obtain labs; continue current medications; recommend daily exercise. 4.  Snoring and  fatigue: New.  Refer for sleep study. 5.  Anxiety and depression: New.  Secondary to recent CVA with seizure activity; start Prozac 20mg  daily; refer to psychiatry at request of wife. 6. Hx of CVA: stable; no recurrent seizure activity; recommend regular exercise. Follow up with neurology today. 7. Seizure activity: stable; no recurrent seizure activity; associated with acute CVA. 8. Dementia: stable; follow-up with neurology today.    Meds ordered this encounter  Medications  . Fluoxetine HCl, PMDD, 20 MG CAPS    Sig: Take 1 capsule (20 mg total) by mouth daily.    Dispense:  30 each    Refill:  5    Return in about 3 months (around 03/12/2015) for complete physical examiniation.     Kristalynn Coddington Elayne Guerin, M.D. [facility] [address] [place], [place]  [id number] [phone] phone [phone] fax

## 2014-12-11 ENCOUNTER — Encounter: Payer: Self-pay | Admitting: Neurology

## 2014-12-11 LAB — IRON AND TIBC
%SAT: 16 % — ABNORMAL LOW (ref 20–55)
IRON: 51 ug/dL (ref 42–165)
TIBC: 318 ug/dL (ref 215–435)
UIBC: 267 ug/dL (ref 125–400)

## 2014-12-11 MED ORDER — METFORMIN HCL 500 MG PO TABS: 500.0000 mg | ORAL_TABLET | Freq: Every day | ORAL | Status: DC

## 2014-12-11 NOTE — Addendum Note (Signed)
Addended by: Wardell Honour on: 12/11/2014 01:35 PM   Modules accepted: Orders

## 2014-12-13 MED ORDER — FERROUS SULFATE 325 (65 FE) MG PO TABS: 325.0000 mg | ORAL_TABLET | Freq: Every day | ORAL | Status: DC

## 2014-12-13 NOTE — Addendum Note (Signed)
Addended by: Wardell Honour on: 12/13/2014 11:01 AM   Modules accepted: Orders

## 2014-12-14 ENCOUNTER — Encounter: Payer: Self-pay | Admitting: *Deleted

## 2015-01-16 ENCOUNTER — Encounter: Payer: Self-pay | Admitting: Neurology

## 2015-01-16 ENCOUNTER — Ambulatory Visit (INDEPENDENT_AMBULATORY_CARE_PROVIDER_SITE_OTHER): Payer: Medicare Other | Admitting: Neurology

## 2015-01-16 VITALS — BP 150/82 | HR 58 | Resp 16 | Ht 71.0 in | Wt 240.0 lb

## 2015-01-16 DIAGNOSIS — R51 Headache: Secondary | ICD-10-CM

## 2015-01-16 DIAGNOSIS — R351 Nocturia: Secondary | ICD-10-CM

## 2015-01-16 DIAGNOSIS — G40909 Epilepsy, unspecified, not intractable, without status epilepticus: Secondary | ICD-10-CM | POA: Diagnosis not present

## 2015-01-16 DIAGNOSIS — G4733 Obstructive sleep apnea (adult) (pediatric): Secondary | ICD-10-CM | POA: Diagnosis not present

## 2015-01-16 DIAGNOSIS — H5340 Unspecified visual field defects: Secondary | ICD-10-CM

## 2015-01-16 DIAGNOSIS — R519 Headache, unspecified: Secondary | ICD-10-CM

## 2015-01-16 DIAGNOSIS — I6930 Unspecified sequelae of cerebral infarction: Secondary | ICD-10-CM

## 2015-01-16 DIAGNOSIS — G4719 Other hypersomnia: Secondary | ICD-10-CM | POA: Diagnosis not present

## 2015-01-16 DIAGNOSIS — E669 Obesity, unspecified: Secondary | ICD-10-CM

## 2015-01-16 DIAGNOSIS — R600 Localized edema: Secondary | ICD-10-CM

## 2015-01-16 DIAGNOSIS — Z8673 Personal history of transient ischemic attack (TIA), and cerebral infarction without residual deficits: Secondary | ICD-10-CM

## 2015-01-16 NOTE — Patient Instructions (Signed)

## 2015-01-16 NOTE — Progress Notes (Addendum)
Subjective:    Patient ID: Geovannie Vilar is a 74 y.o. male.  HPI     Star Age, MD, PhD [facility] [address] [place], [place] [address]  Dear Dr. Tamala Julian,  I saw your patient, Jahiem Franzoni, upon your kind request in my neurologic clinic today for initial consultation of his sleep disorder, in particular, concern for underlying obstructive sleep apnea. The patient is unaccompanied today. As you know, Mr. Fazzino is a 74 year old right-handed gentleman with an underlying medical history of hypertension, memory loss (followed by Dr. Delice Lesch at [facility]), positive RPR (treated with penicillin), obesity, left PCA embolic stroke in November 2015 as well as seizure (followed by my colleague, Dr. Erlinda Hong), who is reported to snore. He reports excessive daytime somnolence, occasional morning headaches, lower extremity swelling, and severe nocturia. His Epworth sleepiness score is 14 out of 24 today. His fatigue score is 48 out of 63 today. He does not maintain a very set scheduled for his sleep. Of note, he lives with his wife and his son who has seizures and sometimes needs assistance at night. His son also carries a diagnosis of OSA and is supposed to be on a CPAP machine. The patient reports a bedtime between 11 PM and 2 AM. He watches TV in bed. He falls asleep often with the TV on. Right time is around 7:30 and he does not wake up rested typically. He denies restless leg symptoms. He had a tonsillectomy as a child. He is a nonsmoker. He drinks alcohol rarely, he drinks quite a bit of caffeine including 3-4 cups of coffee and at least one Pepsi per day.  He currently does not drive. He has residual visual field deficit. He has a follow-up with ophthalmology as I understand in August. I reviewed Dr. Phoebe Sharps office note from 09/04/2014. I reviewed your office note from 12/10/2014.   His Past Medical History Is Significant For: Past Medical History   Diagnosis Date  . Hypertension   . Tinnitus     Right Ear  . Anemia   . Abdominal wall hernia   . Personal history of colonic adenomas 04/14/2013  . 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 Seizures 06/13/2014  . 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  . Hyperlipidemia   . BPH (benign prostatic hyperplasia)   . Glucose intolerance (impaired glucose tolerance)     His Past Surgical History Is Significant For: Past Surgical History  Procedure Laterality Date  . None    . Tee without cardioversion N/A 06/15/2014    Procedure: TRANSESOPHAGEAL ECHOCARDIOGRAM (TEE);  Surgeon: Fay Records, MD;  Location: [facility] ENDOSCOPY;  Service: Cardiovascular;  Laterality: N/A;  . Loop recorder implant N/A 06/19/2014    Procedure: LOOP RECORDER IMPLANT;  Surgeon: Evans Lance, MD;  Location: [facility] CATH LAB;  Service: Cardiovascular;  Laterality: N/A;    His Family History Is Significant For: Family History  Problem Relation Age of Onset  . Breast cancer Mother   . COPD Father   . Hypertension Sister   . Hypertension Brother   . Hypertension Sister   . Liver cancer Mother   . Lung cancer Mother   . Colon cancer Neg Hx   . Colon polyps Neg Hx   . Stomach cancer Neg Hx     His Social History Is Significant For: History   Social History  . Marital Status: Married    Spouse Name: N/A  .  Number of Children: N/A  . Years of Education: N/A   Occupational History  . Retired    Social History Main Topics  . Smoking status: Never Smoker   . Smokeless tobacco: Never Used  . Alcohol Use: No  . Drug Use: No  . Sexual Activity: Not on file   Other Topics Concern  . None   Social History Narrative   Occasionally drinks coffee      Marital status:  Married x 43 years; first wife.      Children:   3 children; 5 grandchildren; 3 gg.      Lives: Lives with wife at home and 16 year old son. Son has special needs, sleep apnea, & severe seizure disorder. Wife has "many healthy problems".  Another daughter in [place]. Daughter in [place].       Employment:  Retired at age 74.      Tobacco; none      Alcohol: rarely      Exercise:  none      3 sisters live in [place]. 1 brother in [place].       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 amLODipine (NORVASC) 2.5 MG tablet Take 1 tablet (2.5 mg total) by mouth daily.  Marland Kitchen aspirin 325 MG EC tablet Take 1 tablet (325 mg total) by mouth daily.  Marland Kitchen atorvastatin (LIPITOR) 80 MG tablet Take 1 tablet (80 mg total) by mouth daily at 6 PM.  . donepezil (ARICEPT) 10 MG tablet Take 1 tablet daily  . ferrous sulfate 325 (65 FE) MG tablet Take 1 tablet (325 mg total) by mouth daily with breakfast.  . Fluoxetine HCl, PMDD, 20 MG CAPS Take 1 capsule (20 mg total) by mouth daily.  . hydrochlorothiazide (HYDRODIURIL) 25 MG tablet Take 25 mg by mouth daily.  Marland Kitchen levETIRAcetam (KEPPRA) 500 MG tablet Take 1 tablet (500 mg total) by mouth every 12 (twelve) hours.  . metFORMIN (GLUCOPHAGE) 500 MG tablet Take 1 tablet (500 mg total) by mouth daily with breakfast.  . tamsulosin (FLOMAX) 0.4 MG CAPS capsule Take 1 capsule (0.4 mg total) by mouth daily after breakfast.   No facility-administered encounter medications on file as of 01/16/2015.  :  Review of Systems:  Out of a complete 14 point review of systems, all are reviewed and negative with the exception of these symptoms as listed below:   Review of Systems  HENT: Positive for rhinorrhea.   Eyes:       Double vision  Neurological:       Snoring, witnessed apnea, no trouble falling or staying asleep, wife reports that patient wakes up and turns on tv- patient does not remember. Sometimes wakes up feeling tired, sometimes feels "sluggish" during day, falls asleep in chair at home.     Objective:  Neurologic  Exam  Physical Exam  Physical Examination:   Filed Vitals:   01/16/15 1033  BP: 150/82  Pulse: 58  Resp: 16   General Examination: The patient is a very pleasant 75 y.o. male in no acute distress. He appears well-developed and well-nourished and well groomed.   HEENT: Normocephalic, atraumatic, pupils are equal, round and reactive to light and accommodation. Funduscopic exam is very difficult secondary to significant bilateral cataracts. He has decrease in VF on  the R on finger perimetry. Extraocular tracking is good without limitation to gaze excursion or nystagmus noted. Normal smooth pursuit is noted. Hearing is grossly intact. Tympanic membranes are clear bilaterally. Face is symmetric with normal facial animation and normal facial sensation. Speech is clear with no dysarthria noted. There is no hypophonia. There is no lip, neck/head, jaw or voice tremor. Neck is supple with full range of passive and active motion. There are no carotid bruits on auscultation. Oropharynx exam reveals: mild mouth dryness, marginal dental hygiene with upper and lower dentures, and mild airway crowding, due to redundant soft palate and wider tongue. Mallampati is class II. Tongue protrudes centrally and palate elevates symmetrically. Tonsils are absent. Neck size is 15.5 inches. He has a Absent overbite.    Chest: Clear to auscultation without wheezing, rhonchi or crackles noted.  Heart: S1+S2+0, regular and normal without murmurs, rubs or gallops noted.   Abdomen: Soft, non-tender and non-distended with normal bowel sounds appreciated on auscultation.  Extremities: There is 2+ pitting edema in the distal lower extremities bilaterally. Pedal pulses are intact.  Skin: Warm and dry without trophic changes noted. There are no varicose veins.  Musculoskeletal: exam reveals no obvious joint deformities, tenderness or joint swelling or erythema.   Neurologically:  Mental status: The patient is awake, alert and  oriented in all 4 spheres. His immediate and remote memory, attention, language skills and fund of knowledge are mildly impaired. There is no evidence of aphasia, agnosia, apraxia or anomia. Speech is clear with normal prosody and enunciation. Thought process is linear. Mood is normal and affect is normal.  Cranial nerves II - XII are as described above under HEENT exam. In addition: shoulder shrug is normal with equal shoulder height noted. Motor exam: Normal bulk, strength and tone is noted. There is no drift, tremor or rebound. Romberg is negative. Reflexes are trace. Fine motor skills and coordination: intact with normal finger taps, normal hand movements, normal rapid alternating patting, normal foot taps and normal foot agility.  Cerebellar testing: No dysmetria or intention tremor on finger to nose testing. Heel to shin is difficult for him bilaterally.  Sensory exam: intact to light touch in the upper and lower extremities.  Gait, station and balance: He stands easily. No veering to one side is noted. No leaning to one side is noted. Posture is  mildly stooped. Stance is slightly wide-based. Gait is otherwise unremarkable for age. He cannot perform tandem walk however. This may be secondary to visual problems.   Assessment and Plan:   In summary, Naif Alabi is a very pleasant 74 y.o.-year old male with an underlying medical history of hypertension, memory loss (followed by Dr. Delice Lesch at [facility]), positive RPR (treated with penicillin), obesity, left PCA embolic stroke in November 2015 as well as seizure (followed by my colleague, Dr. Erlinda Hong), whose history and physical exam are indeed concerning for underlying obstructive sleep apnea (OSA). I had a long chat with the patient about my findings and the diagnosis of OSA, its prognosis and treatment options. We talked about medical treatments, surgical interventions and non-pharmacological approaches. I explained in particular the risks  and ramifications of untreated moderate to severe OSA, especially with respect to developing cardiovascular disease down the Road, including congestive heart failure, difficult to treat hypertension, cardiac arrhythmias, or stroke. Even type 2 diabetes has, in part, been linked to untreated OSA. Symptoms of untreated OSA include daytime sleepiness, memory problems, mood irritability and mood disorder such as depression  and anxiety, lack of energy, as well as recurrent headaches, especially morning headaches. We talked about trying to maintain a healthy lifestyle in general, as well as the importance of weight control. I encouraged the patient to eat healthy, exercise daily and keep well hydrated, to keep a scheduled bedtime and wake time routine, to not skip any meals and eat healthy snacks in between meals. I advised the patient not to drive when feeling sleepy. I recommended the following at this time: sleep study with potential positive airway pressure titration. (We will score hypopneas at 4% and split the sleep study into diagnostic and treatment portion, if the estimated. 2 hour AHI is >20/h).   I explained the sleep test procedure to the patient and also outlined possible surgical and non-surgical treatment options of OSA, including the use of a custom-made dental device (which would require a referral to a specialist dentist or oral surgeon), upper airway surgical options, such as pillar implants, radiofrequency surgery, tongue base surgery, and UPPP (which would involve a referral to an ENT surgeon). Rarely, jaw surgery such as mandibular advancement may be considered.  I also explained the CPAP treatment option to the patient, who indicated that he would be willing to try CPAP if the need arises. I explained the importance of being compliant with PAP treatment, not only for insurance purposes but primarily to improve His symptoms, and for the patient's long term health benefit, including to reduce His  cardiovascular risks. I answered all his questions today and the patient was in agreement. I would like to see him back after the sleep study is completed and encouraged him to call with any interim questions, concerns, problems or updates.   Thank you very much for allowing me to participate in the care of this nice patient. If I can be of any further assistance to you please do not hesitate to call me at [phone].  Sincerely,   Star Age, MD, PhD  I spent 25 minutes in total face-to-face time with the patient, more than 50% of which was spent in counseling and coordination of care, reviewing test results, reviewing medication and discussing or reviewing the diagnosis of OSA, its prognosis and treatment options.

## 2015-03-05 ENCOUNTER — Encounter: Payer: Self-pay | Admitting: *Deleted

## 2015-03-11 ENCOUNTER — Encounter: Payer: Self-pay | Admitting: Family Medicine

## 2015-03-11 DIAGNOSIS — E119 Type 2 diabetes mellitus without complications: Secondary | ICD-10-CM | POA: Insufficient documentation

## 2015-03-11 NOTE — Progress Notes (Signed)
This encounter was created in error - please disregard.

## 2015-03-11 NOTE — Progress Notes (Deleted)
Subjective:    Patient ID: Mark Soto, male    DOB: 11/21/1940, 74 y.o.   MRN: [id number]  03/11/2015  No chief complaint on file.   HPI This 74 y.o. male presents for evaluation for Annual Wellness Examination.  Last physical: Colonoscopy:  04-06-2013 TDAP: Pneumovax:  Pneumovax 06/2014 Zostavax: Influenza: 06/10/2014 Eye exam: Dental exam:    Review of Systems  Constitutional: Negative for fever, chills, diaphoresis, activity change, appetite change, fatigue and unexpected weight change.  HENT: Negative for congestion, dental problem, drooling, ear discharge, ear pain, facial swelling, hearing loss, mouth sores, nosebleeds, postnasal drip, rhinorrhea, sinus pressure, sneezing, sore throat, tinnitus, trouble swallowing and voice change.   Eyes: Negative for photophobia, pain, discharge, redness, itching and visual disturbance.  Respiratory: Negative for apnea, cough, choking, chest tightness, shortness of breath, wheezing and stridor.   Cardiovascular: Negative for chest pain, palpitations and leg swelling.  Gastrointestinal: Negative for nausea, vomiting, abdominal pain, diarrhea, constipation and blood in stool.  Endocrine: Negative for cold intolerance, heat intolerance, polydipsia, polyphagia and polyuria.  Genitourinary: Negative for dysuria, urgency, frequency, hematuria, flank pain, decreased urine volume, discharge, penile swelling, scrotal swelling, enuresis, difficulty urinating, genital sores, penile pain and testicular pain.  Musculoskeletal: Negative for myalgias, back pain, joint swelling, arthralgias, gait problem, neck pain and neck stiffness.  Skin: Negative for color change, pallor, rash and wound.  Allergic/Immunologic: Negative for environmental allergies, food allergies and immunocompromised state.  Neurological: Negative for dizziness, tremors, seizures, syncope, facial asymmetry, speech difficulty, weakness, light-headedness, numbness and headaches.    Hematological: Negative for adenopathy. Does not bruise/bleed easily.  Psychiatric/Behavioral: Negative for suicidal ideas, hallucinations, behavioral problems, confusion, sleep disturbance, self-injury, dysphoric mood, decreased concentration and agitation. The patient is not nervous/anxious and is not hyperactive.     Past Medical History  Diagnosis Date  . Hypertension   . Tinnitus     Right Ear  . Anemia   . Abdominal wall hernia   . Personal history of colonic adenomas 04/14/2013  . 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 Seizures 06/13/2014  . 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  . Hyperlipidemia   . BPH (benign prostatic hyperplasia)   . Glucose intolerance (impaired glucose tolerance)    Past Surgical History  Procedure Laterality Date  . None    . Tee without cardioversion N/A 06/15/2014    Procedure: TRANSESOPHAGEAL ECHOCARDIOGRAM (TEE);  Surgeon: Fay Records, MD;  Location: [facility] ENDOSCOPY;  Service: Cardiovascular;  Laterality: N/A;  . Loop recorder implant N/A 06/19/2014    Procedure: LOOP RECORDER IMPLANT;  Surgeon: Evans Lance, MD;  Location: [facility] CATH LAB;  Service: Cardiovascular;  Laterality: N/A;   No Known Allergies Current Outpatient Prescriptions  Medication Sig Dispense Refill  . amLODipine (NORVASC) 2.5 MG tablet Take 1 tablet (2.5 mg total) by mouth daily. 30 tablet 5  . aspirin 325 MG EC tablet Take 1 tablet (325 mg total) by mouth daily. 30 tablet 0  . atorvastatin (LIPITOR) 80 MG tablet Take 1 tablet (80 mg total) by mouth daily at 6 PM. 30 tablet 5  . donepezil (ARICEPT) 10 MG tablet Take 1 tablet daily 90 tablet 3  . ferrous sulfate 325 (65 FE) MG tablet Take 1 tablet (325 mg total) by mouth daily with breakfast. 90 tablet 1  . Fluoxetine HCl, PMDD, 20 MG CAPS Take 1 capsule (20 mg total) by mouth daily. 30 each 5  . hydrochlorothiazide (HYDRODIURIL) 25 MG tablet Take  25 mg by mouth daily.    Marland Kitchen levETIRAcetam (KEPPRA) 500 MG tablet Take 1  tablet (500 mg total) by mouth every 12 (twelve) hours. 60 tablet 5  . metFORMIN (GLUCOPHAGE) 500 MG tablet Take 1 tablet (500 mg total) by mouth daily with breakfast. 90 tablet 1  . tamsulosin (FLOMAX) 0.4 MG CAPS capsule Take 1 capsule (0.4 mg total) by mouth daily after breakfast. 30 capsule 11   No current facility-administered medications for this visit.       Objective:    There were no vitals taken for this visit. Physical Exam  Constitutional: He is oriented to person, place, and time. He appears well-developed and well-nourished. No distress.  HENT:  Head: Normocephalic and atraumatic.  Right Ear: External ear normal.  Left Ear: External ear normal.  Nose: Nose normal.  Mouth/Throat: Oropharynx is clear and moist.  Eyes: Conjunctivae and EOM are normal. Pupils are equal, round, and reactive to light.  Neck: Normal range of motion. Neck supple. Carotid bruit is not present. No thyromegaly present.  Cardiovascular: Normal rate, regular rhythm, normal heart sounds and intact distal pulses.  Exam reveals no gallop and no friction rub.   No murmur heard. Pulmonary/Chest: Effort normal and breath sounds normal. He has no wheezes. He has no rales.  Abdominal: Soft. Bowel sounds are normal. He exhibits no distension and no mass. There is no tenderness. There is no rebound and no guarding.  Musculoskeletal:       Right shoulder: Normal.       Left shoulder: Normal.       Cervical back: Normal.  Lymphadenopathy:    He has no cervical adenopathy.  Neurological: He is alert and oriented to person, place, and time. He has normal reflexes. No cranial nerve deficit. He exhibits normal muscle tone. Coordination normal.  Skin: Skin is warm and dry. No rash noted. He is not diaphoretic.  Psychiatric: He has a normal mood and affect. His behavior is normal. Judgment and thought content normal.   Results for orders placed or performed in visit on 12/10/14  Hemoglobin A1c  Result Value Ref  Range   Hgb A1c MFr Bld 6.7 (H) <5.7 %   Mean Plasma Glucose 146 (H) <117 mg/dL  Comprehensive metabolic panel  Result Value Ref Range   Sodium 142 135 - 145 mEq/L   Potassium 4.3 3.5 - 5.3 mEq/L   Chloride 103 96 - 112 mEq/L   CO2 29 19 - 32 mEq/L   Glucose, Bld 122 (H) 70 - 99 mg/dL   BUN 14 6 - 23 mg/dL   Creat 0.90 0.50 - 1.35 mg/dL   Total Bilirubin 0.5 0.2 - 1.2 mg/dL   Alkaline Phosphatase 79 39 - 117 U/L   AST 14 0 - 37 U/L   ALT 11 0 - 53 U/L   Total Protein 7.1 6.0 - 8.3 g/dL   Albumin 3.9 3.5 - 5.2 g/dL   Calcium 9.1 8.4 - 10.5 mg/dL  POCT CBC  Result Value Ref Range   WBC 7.0 4.6 - 10.2 K/uL   Lymph, poc 1.4 0.6 - 3.4   POC LYMPH PERCENT 20.3 10 - 50 %L   MID (cbc) 0.5 0 - 0.9   POC MID % 6.5 0 - 12 %M   POC Granulocyte 5.1 2 - 6.9   Granulocyte percent 73.2 37 - 80 %G   RBC 4.86 4.69 - 6.13 M/uL   Hemoglobin 11.5 (A) 14.1 - 18.1 g/dL   HCT,  POC 36.0 (A) 43.5 - 53.7 %   MCV 74.0 (A) 80 - 97 fL   MCH, POC 23.6 (A) 27 - 31.2 pg   MCHC 32.0 31.8 - 35.4 g/dL   RDW, POC 17.2 %   Platelet Count, POC 212 142 - 424 K/uL   MPV 9.1 0 - 99.8 fL  IFOBT POC (occult bld, rslt in office)  Result Value Ref Range   IFOBT Negative        Assessment & Plan:   1. Encounter for Medicare annual wellness exam   2. Vascular dementia, without behavioral disturbance   3. Seizure   4. PFO (patent foramen ovale)   5. History of colonic polyps   6. Obesity (BMI 30.0-34.9)   7. HLD (hyperlipidemia)   8. History of CVA (cerebrovascular accident)   9. Glucose intolerance (impaired glucose tolerance)   10. Essential hypertension   11. Cerebral embolism with cerebral infarction   12. Type 2 diabetes mellitus without complication   13. Encounter for prostate cancer screening   14. Pure hypercholesterolemia     No orders of the defined types were placed in this encounter.    No Follow-up on file.     Ruthell Feigenbaum Elayne Guerin, M.D. [facility] [address] [place], [place]  [address] [phone] phone [phone] fax

## 2015-04-04 ENCOUNTER — Encounter: Payer: Self-pay | Admitting: *Deleted

## 2015-04-05 ENCOUNTER — Encounter: Payer: Self-pay | Admitting: *Deleted

## 2015-05-08 ENCOUNTER — Encounter: Payer: Self-pay | Admitting: *Deleted

## 2015-06-10 ENCOUNTER — Telehealth: Payer: Self-pay | Admitting: Family Medicine

## 2015-06-10 NOTE — Telephone Encounter (Signed)
ALL NUMBERS WERE EITHER WRONG OR DISCONNECTED.  WILL SEND A LETTER FOR PATIENT TO SCHEDULE APPOINTMENT.

## 2015-06-11 ENCOUNTER — Encounter: Payer: Self-pay | Admitting: *Deleted

## 2015-07-11 ENCOUNTER — Encounter (HOSPITAL_COMMUNITY): Payer: Self-pay | Admitting: Emergency Medicine

## 2015-07-11 ENCOUNTER — Emergency Department (HOSPITAL_COMMUNITY): Payer: Medicare Other

## 2015-07-11 ENCOUNTER — Inpatient Hospital Stay (HOSPITAL_COMMUNITY): Payer: Medicare Other

## 2015-07-11 ENCOUNTER — Inpatient Hospital Stay (HOSPITAL_COMMUNITY)
Admission: EM | Admit: 2015-07-11 | Discharge: 2015-07-15 | DRG: 100 | Disposition: A | Discharge status: Skilled Nursing Facility | Payer: Medicare Other | Attending: Internal Medicine | Admitting: Internal Medicine

## 2015-07-11 DIAGNOSIS — E785 Hyperlipidemia, unspecified: Secondary | ICD-10-CM | POA: Diagnosis not present

## 2015-07-11 DIAGNOSIS — E872 Acidosis: Secondary | ICD-10-CM | POA: Diagnosis present

## 2015-07-11 DIAGNOSIS — I10 Essential (primary) hypertension: Secondary | ICD-10-CM | POA: Diagnosis present

## 2015-07-11 DIAGNOSIS — Q211 Atrial septal defect: Secondary | ICD-10-CM

## 2015-07-11 DIAGNOSIS — E669 Obesity, unspecified: Secondary | ICD-10-CM

## 2015-07-11 DIAGNOSIS — J189 Pneumonia, unspecified organism: Secondary | ICD-10-CM | POA: Diagnosis not present

## 2015-07-11 DIAGNOSIS — E089 Diabetes mellitus due to underlying condition without complications: Secondary | ICD-10-CM | POA: Diagnosis not present

## 2015-07-11 DIAGNOSIS — R569 Unspecified convulsions: Secondary | ICD-10-CM | POA: Diagnosis not present

## 2015-07-11 DIAGNOSIS — R4182 Altered mental status, unspecified: Secondary | ICD-10-CM | POA: Diagnosis not present

## 2015-07-11 DIAGNOSIS — G4089 Other seizures: Secondary | ICD-10-CM | POA: Diagnosis not present

## 2015-07-11 DIAGNOSIS — W06XXXA Fall from bed, initial encounter: Secondary | ICD-10-CM | POA: Diagnosis present

## 2015-07-11 DIAGNOSIS — R410 Disorientation, unspecified: Secondary | ICD-10-CM | POA: Diagnosis not present

## 2015-07-11 DIAGNOSIS — E119 Type 2 diabetes mellitus without complications: Secondary | ICD-10-CM | POA: Diagnosis not present

## 2015-07-11 DIAGNOSIS — G40909 Epilepsy, unspecified, not intractable, without status epilepticus: Secondary | ICD-10-CM | POA: Diagnosis not present

## 2015-07-11 DIAGNOSIS — Z9114 Patient's other noncompliance with medication regimen: Secondary | ICD-10-CM | POA: Diagnosis not present

## 2015-07-11 DIAGNOSIS — F0150 Vascular dementia without behavioral disturbance: Secondary | ICD-10-CM

## 2015-07-11 DIAGNOSIS — Z86010 Personal history of colonic polyps: Secondary | ICD-10-CM

## 2015-07-11 DIAGNOSIS — R400 Somnolence: Secondary | ICD-10-CM

## 2015-07-11 DIAGNOSIS — R7302 Impaired glucose tolerance (oral): Secondary | ICD-10-CM

## 2015-07-11 DIAGNOSIS — R9401 Abnormal electroencephalogram [EEG]: Secondary | ICD-10-CM | POA: Diagnosis not present

## 2015-07-11 DIAGNOSIS — R2689 Other abnormalities of gait and mobility: Secondary | ICD-10-CM | POA: Diagnosis not present

## 2015-07-11 DIAGNOSIS — E8729 Other acidosis: Secondary | ICD-10-CM | POA: Diagnosis present

## 2015-07-11 DIAGNOSIS — D72829 Elevated white blood cell count, unspecified: Secondary | ICD-10-CM | POA: Diagnosis not present

## 2015-07-11 DIAGNOSIS — F0390 Unspecified dementia without behavioral disturbance: Secondary | ICD-10-CM

## 2015-07-11 DIAGNOSIS — J690 Pneumonitis due to inhalation of food and vomit: Secondary | ICD-10-CM | POA: Diagnosis not present

## 2015-07-11 DIAGNOSIS — A539 Syphilis, unspecified: Secondary | ICD-10-CM

## 2015-07-11 DIAGNOSIS — J9811 Atelectasis: Secondary | ICD-10-CM | POA: Diagnosis not present

## 2015-07-11 DIAGNOSIS — Z8673 Personal history of transient ischemic attack (TIA), and cerebral infarction without residual deficits: Secondary | ICD-10-CM | POA: Diagnosis not present

## 2015-07-11 DIAGNOSIS — A528 Late syphilis, latent: Secondary | ICD-10-CM

## 2015-07-11 DIAGNOSIS — A530 Latent syphilis, unspecified as early or late: Secondary | ICD-10-CM

## 2015-07-11 DIAGNOSIS — N400 Enlarged prostate without lower urinary tract symptoms: Secondary | ICD-10-CM | POA: Diagnosis not present

## 2015-07-11 DIAGNOSIS — I6340 Cerebral infarction due to embolism of unspecified cerebral artery: Secondary | ICD-10-CM

## 2015-07-11 DIAGNOSIS — Q2112 Patent foramen ovale: Secondary | ICD-10-CM

## 2015-07-11 LAB — DIFFERENTIAL
BASOS PCT: 0 %
Basophils Absolute: 0 10*3/uL (ref 0.0–0.1)
EOS PCT: 0 %
Eosinophils Absolute: 0 10*3/uL (ref 0.0–0.7)
LYMPHS ABS: 3.8 10*3/uL (ref 0.7–4.0)
Lymphocytes Relative: 18 %
MONOS PCT: 5 %
Monocytes Absolute: 1.1 10*3/uL — ABNORMAL HIGH (ref 0.1–1.0)
NEUTROS ABS: 16.1 10*3/uL — AB (ref 1.7–7.7)
Neutrophils Relative %: 77 %

## 2015-07-11 LAB — URINALYSIS, ROUTINE W REFLEX MICROSCOPIC
BILIRUBIN URINE: NEGATIVE
GLUCOSE, UA: 100 mg/dL — AB
KETONES UR: NEGATIVE mg/dL
Leukocytes, UA: NEGATIVE
NITRITE: NEGATIVE
PH: 5 (ref 5.0–8.0)
Protein, ur: NEGATIVE mg/dL
SPECIFIC GRAVITY, URINE: 1.02 (ref 1.005–1.030)

## 2015-07-11 LAB — COMPREHENSIVE METABOLIC PANEL
ALBUMIN: 4 g/dL (ref 3.5–5.0)
ALK PHOS: 86 U/L (ref 38–126)
ALT: 12 U/L — ABNORMAL LOW (ref 17–63)
ANION GAP: 25 — AB (ref 5–15)
AST: 29 U/L (ref 15–41)
BILIRUBIN TOTAL: 0.8 mg/dL (ref 0.3–1.2)
BUN: 18 mg/dL (ref 6–20)
CALCIUM: 9 mg/dL (ref 8.9–10.3)
CO2: 10 mmol/L — ABNORMAL LOW (ref 22–32)
Chloride: 103 mmol/L (ref 101–111)
Creatinine, Ser: 1.38 mg/dL — ABNORMAL HIGH (ref 0.61–1.24)
GFR calc Af Amer: 57 mL/min — ABNORMAL LOW (ref >60)
GFR, EST NON AFRICAN AMERICAN: 49 mL/min — AB (ref >60)
GLUCOSE: 218 mg/dL — AB (ref 65–99)
Potassium: 4.1 mmol/L (ref 3.5–5.1)
Sodium: 138 mmol/L (ref 135–145)
TOTAL PROTEIN: 7.2 g/dL (ref 6.5–8.1)

## 2015-07-11 LAB — I-STAT CHEM 8, ED
BUN: 22 mg/dL — ABNORMAL HIGH (ref 6–20)
BUN: 19 mg/dL (ref 6–20)
CALCIUM ION: 1.04 mmol/L — AB (ref 1.13–1.30)
CALCIUM ION: 1.13 mmol/L (ref 1.13–1.30)
Chloride: 107 mmol/L (ref 101–111)
Chloride: 106 mmol/L (ref 101–111)
Creatinine, Ser: 1.1 mg/dL (ref 0.61–1.24)
Creatinine, Ser: 1.1 mg/dL (ref 0.61–1.24)
GLUCOSE: 212 mg/dL — AB (ref 65–99)
Glucose, Bld: 127 mg/dL — ABNORMAL HIGH (ref 65–99)
HCT: 40 % (ref 39.0–52.0)
HEMATOCRIT: 38 % — AB (ref 39.0–52.0)
HEMOGLOBIN: 13.6 g/dL (ref 13.0–17.0)
HEMOGLOBIN: 12.9 g/dL — AB (ref 13.0–17.0)
Potassium: 3.8 mmol/L (ref 3.5–5.1)
Potassium: 4 mmol/L (ref 3.5–5.1)
SODIUM: 139 mmol/L (ref 135–145)
SODIUM: 140 mmol/L (ref 135–145)
TCO2: 10 mmol/L (ref 0–100)
TCO2: 21 mmol/L (ref 0–100)

## 2015-07-11 LAB — STREP PNEUMONIAE URINARY ANTIGEN: Strep Pneumo Urinary Antigen: NEGATIVE

## 2015-07-11 LAB — CBC
HEMATOCRIT: 38.8 % — AB (ref 39.0–52.0)
Hemoglobin: 12.2 g/dL — ABNORMAL LOW (ref 13.0–17.0)
MCH: 24.2 pg — AB (ref 26.0–34.0)
MCHC: 31.4 g/dL (ref 30.0–36.0)
MCV: 77 fL — AB (ref 78.0–100.0)
Platelets: 226 10*3/uL (ref 150–400)
RBC: 5.04 MIL/uL (ref 4.22–5.81)
RDW: 16.5 % — AB (ref 11.5–15.5)
WBC: 21 10*3/uL — ABNORMAL HIGH (ref 4.0–10.5)

## 2015-07-11 LAB — RAPID URINE DRUG SCREEN, HOSP PERFORMED
AMPHETAMINES: NOT DETECTED
BENZODIAZEPINES: NOT DETECTED
Barbiturates: NOT DETECTED
COCAINE: NOT DETECTED
OPIATES: NOT DETECTED
TETRAHYDROCANNABINOL: NOT DETECTED

## 2015-07-11 LAB — I-STAT CG4 LACTIC ACID, ED
LACTIC ACID, VENOUS: 4.15 mmol/L — AB (ref 0.5–2.0)
Lactic Acid, Venous: 4.57 mmol/L (ref 0.5–2.0)

## 2015-07-11 LAB — URINE MICROSCOPIC-ADD ON

## 2015-07-11 LAB — ETHANOL

## 2015-07-11 LAB — PROTIME-INR
INR: 1.18 (ref 0.00–1.49)
PROTHROMBIN TIME: 15.2 s (ref 11.6–15.2)

## 2015-07-11 LAB — HIV ANTIBODY (ROUTINE TESTING W REFLEX): HIV Screen 4th Generation wRfx: NONREACTIVE

## 2015-07-11 LAB — APTT: aPTT: 33 seconds (ref 24–37)

## 2015-07-11 LAB — CBG MONITORING, ED: Glucose-Capillary: 78 mg/dL (ref 65–99)

## 2015-07-11 LAB — MRSA PCR SCREENING: MRSA by PCR: NEGATIVE

## 2015-07-11 LAB — I-STAT TROPONIN, ED: Troponin i, poc: 0 ng/mL (ref 0.00–0.08)

## 2015-07-11 MED ORDER — DEXTROSE 5 % IV SOLN
500.0000 mg | INTRAVENOUS | Status: DC
  Administered 2015-07-12: 500 mg via INTRAVENOUS
  Filled 2015-07-11 (×2): qty 500

## 2015-07-11 MED ORDER — SODIUM CHLORIDE 0.9 % IV SOLN
500.0000 mg | Freq: Two times a day (BID) | INTRAVENOUS | Status: DC
  Administered 2015-07-11 – 2015-07-12 (×3): 500 mg via INTRAVENOUS
  Filled 2015-07-11 (×5): qty 5

## 2015-07-11 MED ORDER — DEXTROSE 5 % IV SOLN
500.0000 mg | Freq: Once | INTRAVENOUS | Status: AC
  Administered 2015-07-11: 500 mg via INTRAVENOUS
  Filled 2015-07-11: qty 500

## 2015-07-11 MED ORDER — SODIUM CHLORIDE 0.9 % IV BOLUS (SEPSIS)
1000.0000 mL | Freq: Once | INTRAVENOUS | Status: AC
  Administered 2015-07-11: 1000 mL via INTRAVENOUS

## 2015-07-11 MED ORDER — SODIUM CHLORIDE 0.9 % IV SOLN: INTRAVENOUS | Status: AC

## 2015-07-11 MED ORDER — LORAZEPAM 2 MG/ML IJ SOLN
2.0000 mg | Freq: Once | INTRAMUSCULAR | Status: AC
  Administered 2015-07-11: 2 mg via INTRAVENOUS

## 2015-07-11 MED ORDER — PIPERACILLIN-TAZOBACTAM 3.375 G IVPB
3.3750 g | INTRAVENOUS | Status: AC
  Administered 2015-07-11: 3.375 g via INTRAVENOUS
  Filled 2015-07-11: qty 50

## 2015-07-11 MED ORDER — LORAZEPAM 2 MG/ML IJ SOLN
INTRAMUSCULAR | Status: AC
  Filled 2015-07-11: qty 1

## 2015-07-11 MED ORDER — SODIUM CHLORIDE 0.9 % IV SOLN
INTRAVENOUS | Status: DC
  Administered 2015-07-11: 16:00:00 via INTRAVENOUS
  Administered 2015-07-11: 1000 mL via INTRAVENOUS
  Administered 2015-07-12 (×2): via INTRAVENOUS

## 2015-07-11 MED ORDER — AMLODIPINE BESYLATE 2.5 MG PO TABS
2.5000 mg | ORAL_TABLET | Freq: Every day | ORAL | Status: DC
  Administered 2015-07-11 – 2015-07-12 (×2): 2.5 mg via ORAL
  Filled 2015-07-11 (×2): qty 1

## 2015-07-11 MED ORDER — ATORVASTATIN CALCIUM 80 MG PO TABS
80.0000 mg | ORAL_TABLET | Freq: Every day | ORAL | Status: DC
  Administered 2015-07-11 – 2015-07-14 (×4): 80 mg via ORAL
  Filled 2015-07-11 (×5): qty 1

## 2015-07-11 MED ORDER — FERROUS SULFATE 325 (65 FE) MG PO TABS
325.0000 mg | ORAL_TABLET | Freq: Every day | ORAL | Status: DC
  Administered 2015-07-11 – 2015-07-15 (×5): 325 mg via ORAL
  Filled 2015-07-11 (×6): qty 1

## 2015-07-11 MED ORDER — SODIUM CHLORIDE 0.9 % IV SOLN
1000.0000 mg | Freq: Once | INTRAVENOUS | Status: AC
  Administered 2015-07-11: 1000 mg via INTRAVENOUS
  Filled 2015-07-11: qty 10

## 2015-07-11 MED ORDER — ASPIRIN EC 325 MG PO TBEC
325.0000 mg | DELAYED_RELEASE_TABLET | Freq: Every day | ORAL | Status: DC
  Administered 2015-07-11 – 2015-07-15 (×5): 325 mg via ORAL
  Filled 2015-07-11 (×5): qty 1

## 2015-07-11 MED ORDER — HEPARIN SODIUM (PORCINE) 5000 UNIT/ML IJ SOLN
5000.0000 [IU] | Freq: Three times a day (TID) | INTRAMUSCULAR | Status: DC
  Administered 2015-07-11 – 2015-07-15 (×13): 5000 [IU] via SUBCUTANEOUS
  Filled 2015-07-11 (×12): qty 1

## 2015-07-11 MED ORDER — PIPERACILLIN-TAZOBACTAM 3.375 G IVPB
3.3750 g | Freq: Three times a day (TID) | INTRAVENOUS | Status: DC
Start: 2015-07-12 — End: 2015-07-14
  Administered 2015-07-12 – 2015-07-14 (×7): 3.375 g via INTRAVENOUS
  Filled 2015-07-11 (×11): qty 50

## 2015-07-11 MED ORDER — DEXTROSE 5 % IV SOLN
1.0000 g | INTRAVENOUS | Status: DC
  Filled 2015-07-11: qty 10

## 2015-07-11 MED ORDER — DEXTROSE 5 % IV SOLN
1.0000 g | Freq: Once | INTRAVENOUS | Status: AC
  Administered 2015-07-11: 1 g via INTRAVENOUS
  Filled 2015-07-11: qty 10

## 2015-07-11 MED ORDER — SODIUM CHLORIDE 0.9 % IV SOLN
1000.0000 mg | INTRAVENOUS | Status: AC
  Filled 2015-07-11: qty 10

## 2015-07-11 NOTE — Consult Note (Signed)
Referring Physician: ED Reason for the consult: seizures, increasing confusion for the last week.  HPI:                                                                                                                                         Mark Soto is an 74 y.o. male with a past medical history that is relevant for HTN, HLD, DM, left PCA infarct 6384 complicated by post stroke GTC seizure, non adherence to AED therapy (keppra), brought in after sustaining 2 back to back seizures at home. Had another seizure in the ED and was given 2 mg iv ativan.  Wife is at the bedside and said that she found him in the floor and that she witnessed 2 back to back generalized seizures. She thinks that he most likely fell off the bed. At the same time, she indicated that Mr Keeling hasn't been taking his keppra in a long time. Patient is confused at baseline, but wife reports that he has been increasingly confused over the last week. Further, wife tells me that he is forgetful, gets easily agitated, and falls frequently due to poor balance. CT brain was personally reviewed and showed no acute abnormality. Available serologies were reviewed: etoh level<5, UA without infection, UDS negative, CMP unremarkable, wbc 21 Chest X ray concerning for bibasilar atelectasis or infectious infiltrate. Presently, sedated after receiving IV ativan.   Date last known well: unable to determine Time last known well: unable to determine tPA Given: no, late presentation   Past Medical History  Diagnosis Date  . Hypertension   . Tinnitus     Right Ear  . Anemia   . Abdominal wall hernia   . Personal history of colonic adenomas 04/14/2013  . Acute left PCA stroke ([place]) 06/13/2014  . Seizures ([place]) 06/13/2014  . 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  . Hyperlipidemia   . BPH (benign prostatic hyperplasia)   . Glucose intolerance (impaired glucose tolerance)     Past Surgical History  Procedure  Laterality Date  . None    . Tee without cardioversion N/A 06/15/2014    Procedure: TRANSESOPHAGEAL ECHOCARDIOGRAM (TEE);  Surgeon: Fay Records, MD;  Location: [facility] ENDOSCOPY;  Service: Cardiovascular;  Laterality: N/A;  . Loop recorder implant N/A 06/19/2014    Procedure: LOOP RECORDER IMPLANT;  Surgeon: Evans Lance, MD;  Location: [facility] CATH LAB;  Service: Cardiovascular;  Laterality: N/A;    Family History  Problem Relation Age of Onset  . Breast cancer Mother   . COPD Father   . Hypertension Sister   . Hypertension Brother   . Hypertension Sister   . Liver cancer Mother   . Lung cancer Mother   . Colon cancer Neg Hx   . Colon polyps Neg Hx   . Stomach cancer Neg Hx    Social History:  reports that  he has never smoked. He has never used smokeless tobacco. He reports that he does not drink alcohol or use illicit drugs. Family history: unable to obtain due to mental status Allergies: No Known Allergies  Medications:                                                                                                                           I have reviewed the patient's current medications.  ROS: unable to obtain due to mental status                                                                                                                                      History obtained from wife and chart review    Physical exam:  Constitutional: well developed, sedated s/p iv ativan, in no apparent distress. Blood pressure 133/73, pulse 99, resp. rate 26, SpO2 100 %. Eyes: no jaundice or exophthalmos.  Head: normocephalic. Neck: supple, no bruits, no JVD. Cardiac: no murmurs. Lungs: clear. Abdomen: soft, no tender, no mass. Extremities: bilateral LE edema, noclubbing or cyanosis.  Skin: no rash  Neurologic Examination:                                                                                                      General: NAD Mental Status: Heavily sedated s/p iv ativan,  open eyes to verbal commands. Cranial Nerves: II: Discs flat bilaterally; Visual fields couldn't be assessed,  pupils equal, round, reactive to light III,IV, VI: ptosis not present, extra-ocular motions intact bilaterally V,VII: smile symmetric, facial light touch sensation normal bilaterally VIII: hearing normal bilaterally IX,X: uvula rises symmetrically XI: bilateral shoulder shrug XII: midline tongue extension without atrophy or fasciculations Motor: Moves all limbs spontaneously and symmetrically Tone and bulk:normal tone throughout; no atrophy noted Sensory: reacts to noxious stimuli Deep Tendon Reflexes:  1+ all over Plantars: Right: downgoing   Left: downgoing Cerebellar: Unable to test due to mental status Gait:  Unable to  test due to mental status     Results for orders placed or performed during the hospital encounter of 07/11/15 (from the past 48 hour(s))  Protime-INR     Status: None   Collection Time: 07/11/15  1:26 AM  Result Value Ref Range   Prothrombin Time 15.2 11.6 - 15.2 seconds   INR 1.18 0.00 - 1.49  APTT     Status: None   Collection Time: 07/11/15  1:26 AM  Result Value Ref Range   aPTT 33 24 - 37 seconds  CBC     Status: Abnormal   Collection Time: 07/11/15  1:26 AM  Result Value Ref Range   WBC 21.0 (H) 4.0 - 10.5 K/uL    Comment: REPEATED TO VERIFY WHITE COUNT CONFIRMED ON SMEAR    RBC 5.04 4.22 - 5.81 MIL/uL   Hemoglobin 12.2 (L) 13.0 - 17.0 g/dL   HCT 38.8 (L) 39.0 - 52.0 %   MCV 77.0 (L) 78.0 - 100.0 fL   MCH 24.2 (L) 26.0 - 34.0 pg   MCHC 31.4 30.0 - 36.0 g/dL   RDW 16.5 (H) 11.5 - 15.5 %   Platelets 226 150 - 400 K/uL  Differential     Status: Abnormal   Collection Time: 07/11/15  1:26 AM  Result Value Ref Range   Neutrophils Relative % 77 %   Lymphocytes Relative 18 %   Monocytes Relative 5 %   Eosinophils Relative 0 %   Basophils Relative 0 %   Neutro Abs 16.1 (H) 1.7 - 7.7 K/uL   Lymphs Abs 3.8 0.7 - 4.0 K/uL   Monocytes  Absolute 1.1 (H) 0.1 - 1.0 K/uL   Eosinophils Absolute 0.0 0.0 - 0.7 K/uL   Basophils Absolute 0.0 0.0 - 0.1 K/uL   RBC Morphology TEARDROP CELLS     Comment: ELLIPTOCYTES   WBC Morphology MILD LEFT SHIFT (1-5% METAS, OCC MYELO, OCC BANDS)   Comprehensive metabolic panel     Status: Abnormal   Collection Time: 07/11/15  1:26 AM  Result Value Ref Range   Sodium 138 135 - 145 mmol/L   Potassium 4.1 3.5 - 5.1 mmol/L   Chloride 103 101 - 111 mmol/L   CO2 10 (L) 22 - 32 mmol/L   Glucose, Bld 218 (H) 65 - 99 mg/dL   BUN 18 6 - 20 mg/dL   Creatinine, Ser 1.38 (H) 0.61 - 1.24 mg/dL   Calcium 9.0 8.9 - 10.3 mg/dL   Total Protein 7.2 6.5 - 8.1 g/dL   Albumin 4.0 3.5 - 5.0 g/dL   AST 29 15 - 41 U/L   ALT 12 (L) 17 - 63 U/L   Alkaline Phosphatase 86 38 - 126 U/L   Total Bilirubin 0.8 0.3 - 1.2 mg/dL   GFR calc non Af Amer 49 (L) >60 mL/min   GFR calc Af Amer 57 (L) >60 mL/min    Comment: (NOTE) The eGFR has been calculated using the CKD EPI equation. This calculation has not been validated in all clinical situations. eGFR's persistently <60 mL/min signify possible Chronic Kidney Disease.    Anion gap 25 (H) 5 - 15    Comment: RESULT CHECKED  I-stat troponin, ED (not at [facility])     Status: None   Collection Time: 07/11/15  1:26 AM  Result Value Ref Range   Troponin i, poc 0.00 0.00 - 0.08 ng/mL   Comment 3            Comment: Due to  the release kinetics of cTnI, a negative result within the first hours of the onset of symptoms does not rule out myocardial infarction with certainty. If myocardial infarction is still suspected, repeat the test at appropriate intervals.   Ethanol     Status: None   Collection Time: 07/11/15  1:27 AM  Result Value Ref Range   Alcohol, Ethyl (B) <5 <5 mg/dL    Comment:        LOWEST DETECTABLE LIMIT FOR SERUM ALCOHOL IS 5 mg/dL FOR MEDICAL PURPOSES ONLY   I-Stat Chem 8, ED  (not at [facility], [facility])     Status: Abnormal   Collection Time: 07/11/15   1:29 AM  Result Value Ref Range   Sodium 139 135 - 145 mmol/L   Potassium 3.8 3.5 - 5.1 mmol/L   Chloride 107 101 - 111 mmol/L   BUN 22 (H) 6 - 20 mg/dL   Creatinine, Ser 1.10 0.61 - 1.24 mg/dL   Glucose, Bld 212 (H) 65 - 99 mg/dL   Calcium, Ion 1.04 (L) 1.13 - 1.30 mmol/L   TCO2 10 0 - 100 mmol/L   Hemoglobin 13.6 13.0 - 17.0 g/dL   HCT 40.0 39.0 - 52.0 %  Urine rapid drug screen (hosp performed)not at [facility]     Status: None   Collection Time: 07/11/15  3:06 AM  Result Value Ref Range   Opiates NONE DETECTED NONE DETECTED   Cocaine NONE DETECTED NONE DETECTED   Benzodiazepines NONE DETECTED NONE DETECTED   Amphetamines NONE DETECTED NONE DETECTED   Tetrahydrocannabinol NONE DETECTED NONE DETECTED   Barbiturates NONE DETECTED NONE DETECTED    Comment:        DRUG SCREEN FOR MEDICAL PURPOSES ONLY.  IF CONFIRMATION IS NEEDED FOR ANY PURPOSE, NOTIFY LAB WITHIN 5 DAYS.        LOWEST DETECTABLE LIMITS FOR URINE DRUG SCREEN Drug Class       Cutoff (ng/mL) Amphetamine      1000 Barbiturate      200 Benzodiazepine   466 Tricyclics       599 Opiates          300 Cocaine          300 THC              50   Urinalysis, Routine w reflex microscopic (not at [facility])     Status: Abnormal   Collection Time: 07/11/15  3:06 AM  Result Value Ref Range   Color, Urine YELLOW YELLOW   APPearance CLOUDY (A) CLEAR   Specific Gravity, Urine 1.020 1.005 - 1.030   pH 5.0 5.0 - 8.0   Glucose, UA 100 (A) NEGATIVE mg/dL   Hgb urine dipstick MODERATE (A) NEGATIVE   Bilirubin Urine NEGATIVE NEGATIVE   Ketones, ur NEGATIVE NEGATIVE mg/dL   Protein, ur NEGATIVE NEGATIVE mg/dL   Nitrite NEGATIVE NEGATIVE   Leukocytes, UA NEGATIVE NEGATIVE  Urine microscopic-add on     Status: Abnormal   Collection Time: 07/11/15  3:06 AM  Result Value Ref Range   Squamous Epithelial / LPF 0-5 (A) NONE SEEN   WBC, UA 0-5 0 - 5 WBC/hpf   RBC / HPF 6-30 0 - 5 RBC/hpf   Bacteria, UA RARE (A) NONE SEEN   Casts HYALINE  CASTS (A) NEGATIVE   Crystals URIC ACID CRYSTALS (A) NEGATIVE   Ct Head Wo Contrast  07/11/2015  CLINICAL DATA:  Witnessed seizure. Last seen normal a week ago. Baseline confusion has worsened. EXAM:  CT HEAD WITHOUT CONTRAST TECHNIQUE: Contiguous axial images were obtained from the base of the skull through the vertex without intravenous contrast. COMPARISON:  06/13/2014 FINDINGS: There is no intracranial hemorrhage, mass or evidence of acute infarction. There is no extra-axial fluid collection. There is moderately severe generalized atrophy. Moderately severe periventricular white matter hypodensity is likely due to chronic small vessel ischemic disease. There is remote lacunar infarction in the right thalamus. There is remote left occipital infarction. No acute intracranial findings are evident. No bone abnormalities evident. The visible paranasal sinuses are clear. IMPRESSION: Generalized atrophy and moderately severe chronic white matter changes likely due to small vessel disease. Remote infarctions. No acute intracranial findings. Electronically Signed   By: Andreas Newport M.D.   On: 07/11/2015 01:52   Dg Chest Portable 1 View  07/11/2015  CLINICAL DATA:  Seizure.  Dyspnea. EXAM: PORTABLE CHEST 1 VIEW COMPARISON:  01/29/2013 FINDINGS: Basilar opacities are present bilaterally due to atelectasis or infectious infiltrate. No large effusions. No pneumothorax. IMPRESSION: Bibasilar atelectasis or infectious infiltrate. Aspiration not excluded. Electronically Signed   By: Andreas Newport M.D.   On: 07/11/2015 02:54     Assessment: 74 y.o. male with known symptomatic post stroke GTC seizures, here in the ED due to a cluster of his habitual seizures, most likely resulting of non adherence to treatment.  He is very drowsy s/p iv ativan and seizures but open eyes to verbal command and seems to have no new focal deficits on exam. CT brain without acute abnormality. Elevated white count with CXR  suggestive of infectious infiltrates, can not ruled out aspiration. Will give additional 1 gram IV keppra now (already received 500 mg, but 1.5 gr is an appropriate dose for patient size) and continue 500 mg BID. EEG. MRI brain already requested by ED attending. Will follow up.   Lorenza Chick [facility] [phone]  07/11/2015, 4:00 AM

## 2015-07-11 NOTE — ED Notes (Signed)
Pt placed om bed alarm.

## 2015-07-11 NOTE — ED Notes (Signed)
MD at bedside. 

## 2015-07-11 NOTE — Progress Notes (Signed)
Patient seen and examined. Admitted after midnight secondary to seizure and presumed aspiration PNA. Elevated lactic acid and WBC's also appreciated. Patient ended having a total of 3 seizure activity, last one while in the ED. He has now been started on IV keppra and received ativan. Positive post ictal state on exam. Abnormal EEG due to the presence of occasional left temporal sharp wave activity indicating a potential seizure foci. Neurology is on board. Please refer to H&P written by Dr. Alcario Drought for further info/details on admission.  Barton Dubois [phone]

## 2015-07-11 NOTE — ED Notes (Signed)
Paged SLP for estimate on timeframe for completing swallow screen

## 2015-07-11 NOTE — ED Notes (Signed)
Report from [facility]> wife reported seizure in bed tonight.  Pt began seizing again on arrival to ED.  Last seizure prior to tonight was 1 year ago.  EMS reports 1 episode of projectile vomiting just pta.  Pt has R sided gaze.  Dr. Leonides Schanz notified of pt on arrival.  Orders for Ativan IV received and Dr. Leonides Schanz immediately to bedside.  Pt suctioned on arrival. Seizure pads in place on bed.

## 2015-07-11 NOTE — Procedures (Signed)
ELECTROENCEPHALOGRAM REPORT   Patient: Mark Soto       Age: 74 y.o.        Sex: male Referring Physician: Dr Dyann Kief Report Date:  07/11/2015        Interpreting Physician: Hulen Luster  History: Duwayne Bohland is an 74 y.o. male hx of seizures presenting with a cluster of episodes  Medications:  I have reviewed the patient's current medications.  Conditions of Recording:  This is a 16 channel EEG carried out with the patient in the drowsy state.  Description:  The waking background activity consists of a low voltage, symmetrical, fairly well organized, theta activity with brief periods of posterior alpha activity noted. No focal slowing noted. There are intermittent left sided sharp waves noted with an apparent foci at T3. No clear evolution to seizure activity noted.    Hyperventilation was not performed. Intermittent photic stimulation was not performed.  IMPRESSION: Abnormal EEG due to the presence of occasional left temporal sharp wave activity indicating a potential seizure foci.   Jim Like, DO [facility] [phone]  If 7pm- 7am, please page neurology on call as listed in [place]. 07/11/2015, 10:47 AM
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
## 2015-07-11 NOTE — ED Notes (Signed)
Iv attempted x 2 without succes.

## 2015-07-11 NOTE — ED Notes (Signed)
Mark Soto contacted with status update.

## 2015-07-11 NOTE — Progress Notes (Signed)
ANTIBIOTIC CONSULT NOTE - INITIAL  Pharmacy Consult:  Zosyn Indication:   Aspiration PNA  No Known Allergies  Patient Measurements: Weight: 240 lb 1.3 oz (108.9 kg)  Vital Signs: Temp: 97.9 F (36.6 C) (12/01 1801) Temp Source: Oral (12/01 1801) BP: 150/65 mmHg (12/01 1653) Pulse Rate: 73 (12/01 1715) Intake/Output from previous day: 11/30 0701 - 12/01 0700 In: 1400 [I.V.:1400] Out: -  Intake/Output from this shift: Total I/O In: 105 [IV Piggyback:105] Out: 200 [Urine:200]  Labs:  Recent Labs  07/11/15 0126 07/11/15 0129 07/11/15 0448  WBC 21.0*  --   --   HGB 12.2* 13.6 12.9*  PLT 226  --   --   CREATININE 1.38* 1.10 1.10   Estimated Creatinine Clearance: 75 mL/min (by C-G formula based on Cr of 1.1). No results for input(s): VANCOTROUGH, VANCOPEAK, VANCORANDOM, GENTTROUGH, GENTPEAK, GENTRANDOM, TOBRATROUGH, TOBRAPEAK, TOBRARND, AMIKACINPEAK, AMIKACINTROU, AMIKACIN in the last 72 hours.   Microbiology: No results found for this or any previous visit (from the past 720 hour(s)).  Medical History: Past Medical History  Diagnosis Date  . Hypertension   . Tinnitus     Right Ear  . Anemia   . Abdominal wall hernia   . Personal history of colonic adenomas 04/14/2013  . 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 Seizures ([place]) 06/13/2014  . 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  . Hyperlipidemia   . BPH (benign prostatic hyperplasia)   . Glucose intolerance (impaired glucose tolerance)       Assessment: 25 YOM with history of seizure since his last CVA in 2015.  Patient suffered 2 back to back seizures PTA and Pharmacy consulted to initiate Zosyn for aspiration PNA.  Baseline labs reviewed.  Zosyn 12/1 >> Azith 12/1 >> CTX 12/1 >> 12/1   Goal of Therapy:  Resolution of infection   Plan:  - Zosyn 3.375gm IV Q8H, 4 hr infusion - Pharmacy will sign off and follow peripherally.  Thank you for the consult!   Charlean Carneal D. Mina Marble, PharmD,  BCPS Pager:  [phone] 07/11/2015, 6:49 PM

## 2015-07-11 NOTE — ED Notes (Signed)
Informed by Mark Soto neuro pa that pt standing at bedisde then immediately found in floor. Dr. Wilson Singer to bedside. Pt returns to bed. Denies any injury or c/o.

## 2015-07-11 NOTE — ED Notes (Signed)
Returned from CT.

## 2015-07-11 NOTE — Evaluation (Signed)
Clinical/Bedside Swallow Evaluation Patient Details  Name: Mark Soto MRN: [id number] Date of Birth: Jun 10, 1941  Today's Date: 07/11/2015 Time: SLP Start Time (ACUTE ONLY): [id number] SLP Stop Time (ACUTE ONLY): 1358 SLP Time Calculation (min) (ACUTE ONLY): 13 min  Past Medical History:  Past Medical History  Diagnosis Date  . Hypertension   . Tinnitus     Right Ear  . Anemia   . Abdominal wall hernia   . Personal history of colonic adenomas 04/14/2013  . Acute left PCA stroke ([place]) 06/13/2014  . Seizures ([place]) 06/13/2014  . 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  . Hyperlipidemia   . BPH (benign prostatic hyperplasia)   . Glucose intolerance (impaired glucose tolerance)    Past Surgical History:  Past Surgical History  Procedure Laterality Date  . None    . Tee without cardioversion N/A 06/15/2014    Procedure: TRANSESOPHAGEAL ECHOCARDIOGRAM (TEE);  Surgeon: Fay Records, MD;  Location: [facility] ENDOSCOPY;  Service: Cardiovascular;  Laterality: N/A;  . Loop recorder implant N/A 06/19/2014    Procedure: LOOP RECORDER IMPLANT;  Surgeon: Evans Lance, MD;  Location: [facility] CATH LAB;  Service: Cardiovascular;  Laterality: N/A;   HPI:  Mark Soto is a 74 y.o. male with h/o seizures since stroke back in 2015. Has had increasing confusion for the past week and suffered 2 back to back seizures at home today. Patient had 3rd seizure in ED and was given 2mg  IV ativan.Previous BSE 06/2014 showed oropharyngeal swallow WFL.   Assessment / Plan / Recommendation Clinical Impression  Pt has slow bolus manipulation as he does not have his dentures, and typically does not eat without them. Otherwise, his oropharyngeal swallows appears to be within gross functional limits. Would initiate Dys 2 diet and thin liquids. Once his wife is able to bring in his dentures, pt my be advanced to a regular diet without additional SLP folllow up.    Aspiration Risk  Mild aspiration risk     Diet Recommendation  Dys 2 diet, Thin liquids  Can be advanced to regular textures once he has his dentures  Medication Administration: Whole meds with liquid    Other  Recommendations Oral Care Recommendations: Oral care BID   Follow up Recommendations  None    Frequency and Duration            Prognosis        Swallow Study   General Date of Onset: 07/11/15 HPI: Mark Soto is a 74 y.o. male with h/o seizures since stroke back in 2015. Has had increasing confusion for the past week and suffered 2 back to back seizures at home today. Patient had 3rd seizure in ED and was given 2mg  IV ativan.Previous BSE 06/2014 showed oropharyngeal swallow WFL. Type of Study: Bedside Swallow Evaluation Previous Swallow Assessment: see HPI Diet Prior to this Study: NPO Temperature Spikes Noted: Yes (99.7) Respiratory Status: Room air History of Recent Intubation: No Behavior/Cognition: Alert;Cooperative;Pleasant mood;Requires cueing Oral Cavity Assessment: Within Functional Limits Oral Care Completed by SLP: No Oral Cavity - Dentition: Edentulous;Dentures, not available (per daughter, wife to bring in dentures later today) Vision: Functional for self-feeding Self-Feeding Abilities: Able to feed self Patient Positioning: Upright in bed Baseline Vocal Quality: Normal    Oral/Motor/Sensory Function Overall Oral Motor/Sensory Function: Within functional limits   Ice Chips Ice chips: Not tested   Thin Liquid Thin Liquid: Within functional limits Presentation: Cup;Self Fed;Straw    Nectar Thick Nectar Thick Liquid: Not  tested   Honey Thick Honey Thick Liquid: Not tested   Puree Puree: Within functional limits Presentation: Self Fed;Spoon   Solid Solid: Within functional limits Presentation: Self Fed      Germain Osgood, M.A. CCC-SLP [phone]  Germain Osgood 07/11/2015,2:43 PM

## 2015-07-11 NOTE — ED Notes (Signed)
Ward, MD aware of abnormal lab test results 

## 2015-07-11 NOTE — H&P (Signed)
[facility] History and Physical  Mark Soto [id number] DOB: 17-Dec-1940 DOA: 07/11/2015  Referring physician: EDP PCP: Mark Forts, MD   Chief Complaint: Seizures   HPI: Mark Soto is a 74 y.o. male with h/o seizures since stroke back in 2015.  Has had increasing confusion for the past week and suffered 2 back to back seizures at home today.  Patient had 3rd seizure in ED and was given 2mg  IV ativan.  He is now post-ictal.  Work up in the ED is also suspicious for B basilar infiltrates, he has a WBC of 21k with a mild left shift as well.  His Bicarb is 10, lactate is pending though it is suspicious that this may be lactic acidosis in setting of multiple seizures.  Review of Systems: Unable to perform due to AMS  Past Medical History  Diagnosis Date  . Hypertension   . Tinnitus     Right Ear  . Anemia   . Abdominal wall hernia   . Personal history of colonic adenomas 04/14/2013  . Acute left PCA stroke ([place]) 06/13/2014  . Seizures ([place]) 06/13/2014  . 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  . Hyperlipidemia   . BPH (benign prostatic hyperplasia)   . Glucose intolerance (impaired glucose tolerance)    Past Surgical History  Procedure Laterality Date  . None    . Tee without cardioversion N/A 06/15/2014    Procedure: TRANSESOPHAGEAL ECHOCARDIOGRAM (TEE);  Surgeon: Mark Records, MD;  Location: [facility] ENDOSCOPY;  Service: Cardiovascular;  Laterality: N/A;  . Loop recorder implant N/A 06/19/2014    Procedure: LOOP RECORDER IMPLANT;  Surgeon: Evans Lance, MD;  Location: [facility] CATH LAB;  Service: Cardiovascular;  Laterality: N/A;   Social History:  reports that he has never smoked. He has never used smokeless tobacco. He reports that he does not drink alcohol or use illicit drugs.  No Known Allergies  Family History  Problem Relation Age of Onset  . Breast cancer Mother   . COPD Father   . Hypertension Sister   . Hypertension Brother   .  Hypertension Sister   . Liver cancer Mother   . Lung cancer Mother   . Colon cancer Neg Hx   . Colon polyps Neg Hx   . Stomach cancer Neg Hx      Prior to Admission medications   Medication Sig Start Date End Date Taking? Authorizing Provider  amLODipine (NORVASC) 2.5 MG tablet Take 1 tablet (2.5 mg total) by mouth daily. Patient not taking: Reported on 07/11/2015 07/20/14   Wardell Honour, MD  aspirin 325 MG EC tablet Take 1 tablet (325 mg total) by mouth daily. Patient not taking: Reported on 07/11/2015 06/19/14   Timmothy Euler, MD  atorvastatin (LIPITOR) 80 MG tablet Take 1 tablet (80 mg total) by mouth daily at 6 PM. Patient not taking: Reported on 07/11/2015 07/20/14   Wardell Honour, MD  donepezil (ARICEPT) 10 MG tablet Take 1 tablet daily Patient not taking: Reported on 07/11/2015 09/10/14   Cameron Sprang, MD  ferrous sulfate 325 (65 FE) MG tablet Take 1 tablet (325 mg total) by mouth daily with breakfast. Patient not taking: Reported on 07/11/2015 12/13/14   Wardell Honour, MD  Fluoxetine HCl, PMDD, 20 MG CAPS Take 1 capsule (20 mg total) by mouth daily. Patient not taking: Reported on 07/11/2015 12/10/14   Wardell Honour, MD  levETIRAcetam (KEPPRA) 500 MG tablet Take 1 tablet (500 mg  total) by mouth every 12 (twelve) hours. Patient not taking: Reported on 07/11/2015 07/20/14   Wardell Honour, MD  metFORMIN (GLUCOPHAGE) 500 MG tablet Take 1 tablet (500 mg total) by mouth daily with breakfast. Patient not taking: Reported on 07/11/2015 12/11/14   Wardell Honour, MD  tamsulosin [facility] 0.4 MG CAPS capsule Take 1 capsule (0.4 mg total) by mouth daily after breakfast. Patient not taking: Reported on 07/11/2015 07/20/14   Wardell Honour, MD   Physical Exam: Filed Vitals:   07/11/15 0415 07/11/15 0430  BP: 140/87 152/82  Pulse: 96 82  Resp: 21 21    BP 152/82 mmHg  Pulse 82  Resp 21  SpO2 100%  General Appearance:    Post ictal / sedated no distress, appears stated age  Head:     Normocephalic, atraumatic  Eyes:    PERRL, EOMI, sclera non-icteric        Nose:   Nares without drainage or epistaxis. Mucosa, turbinates normal  Throat:   Moist mucous membranes. Oropharynx without erythema or exudate.  Neck:   Supple. No carotid bruits.  No thyromegaly.  No lymphadenopathy.   Back:     No CVA tenderness, no spinal tenderness  Lungs:     Clear to auscultation bilaterally, without wheezes, rhonchi or rales  Chest wall:    No tenderness to palpitation  Heart:    Regular rate and rhythm without murmurs, gallops, rubs  Abdomen:     Soft, non-tender, nondistended, normal bowel sounds, no organomegaly  Genitalia:    deferred  Rectal:    deferred  Extremities:   No clubbing, cyanosis or edema.  Pulses:   2+ and symmetric all extremities  Skin:   Skin color, texture, turgor normal, no rashes or lesions  Lymph nodes:   Cervical, supraclavicular, and axillary nodes normal  Neurologic:   MAE, opens eyes to verbal command.    Labs on Admission:  Basic Metabolic Panel:  Recent Labs Lab 07/11/15 0126 07/11/15 0129  NA 138 139  K 4.1 3.8  CL 103 107  CO2 10*  --   GLUCOSE 218* 212*  BUN 18 22*  CREATININE 1.38* 1.10  CALCIUM 9.0  --    Liver Function Tests:  Recent Labs Lab 07/11/15 0126  AST 29  ALT 12*  ALKPHOS 86  BILITOT 0.8  PROT 7.2  ALBUMIN 4.0   No results for input(s): LIPASE, AMYLASE in the last 168 hours. No results for input(s): AMMONIA in the last 168 hours. CBC:  Recent Labs Lab 07/11/15 0126 07/11/15 0129  WBC 21.0*  --   NEUTROABS 16.1*  --   HGB 12.2* 13.6  HCT 38.8* 40.0  MCV 77.0*  --   PLT 226  --    Cardiac Enzymes: No results for input(s): CKTOTAL, CKMB, CKMBINDEX, TROPONINI in the last 168 hours.  BNP (last 3 results) No results for input(s): PROBNP in the last 8760 hours. CBG: No results for input(s): GLUCAP in the last 168 hours.  Radiological Exams on Admission: Ct Head Wo Contrast  07/11/2015  CLINICAL DATA:   Witnessed seizure. Last seen normal a week ago. Baseline confusion has worsened. EXAM: CT HEAD WITHOUT CONTRAST TECHNIQUE: Contiguous axial images were obtained from the base of the skull through the vertex without intravenous contrast. COMPARISON:  06/13/2014 FINDINGS: There is no intracranial hemorrhage, mass or evidence of acute infarction. There is no extra-axial fluid collection. There is moderately severe generalized atrophy. Moderately severe periventricular white matter hypodensity is  likely due to chronic small vessel ischemic disease. There is remote lacunar infarction in the right thalamus. There is remote left occipital infarction. No acute intracranial findings are evident. No bone abnormalities evident. The visible paranasal sinuses are clear. IMPRESSION: Generalized atrophy and moderately severe chronic white matter changes likely due to small vessel disease. Remote infarctions. No acute intracranial findings. Electronically Signed   By: Andreas Newport M.D.   On: 07/11/2015 01:52   Dg Chest Portable 1 View  07/11/2015  CLINICAL DATA:  Seizure.  Dyspnea. EXAM: PORTABLE CHEST 1 VIEW COMPARISON:  01/29/2013 FINDINGS: Basilar opacities are present bilaterally due to atelectasis or infectious infiltrate. No large effusions. No pneumothorax. IMPRESSION: Bibasilar atelectasis or infectious infiltrate. Aspiration not excluded. Electronically Signed   By: Andreas Newport M.D.   On: 07/11/2015 02:54    EKG: Independently reviewed.  Assessment/Plan Active Problems:   Altered mental status   Seizures (HCC)   CAP (community acquired pneumonia)   High anion gap metabolic acidosis   1. Seizures - in setting of CAP and not taking keppra at home 1. Neurology has evaluated 2. Patient on keppra IV 3. hasnt been taking keppra for some time at home 4. Seizure precautions 5. Currently post-ictal / sedated from ativan. 2. CAP - 1. PNA pathway 2. Rocephin / azithromycin 3. Cultures  pending 3. High anion gap metabolic acidosis -  1. Repeat chem-8 and lactate pending 2. Suspect lactic acidosis due to multiple seizures 4. Social work consult for NH placement placed at request of family due to patients chronic mental decline.    Code Status: Full Code  Family Communication: Family at bedside Disposition Plan: Admit to inpatient   Time spent: 70 min  Turkessa Ostrom M. [facility] Pager [phone]  If 7AM-7PM, please contact the day team taking care of the patient [email or web address] Password [email or web address] 07/11/2015, 4:36 AM

## 2015-07-11 NOTE — ED Notes (Signed)
Transported on monitor and NRB to CT with RN

## 2015-07-11 NOTE — Progress Notes (Signed)
EEG completed; results pending.    

## 2015-07-11 NOTE — ED Provider Notes (Signed)
By signing my name below, I, Hansel Feinstein, attest that this documentation has been prepared under the direction and in the presence of [place], DO. Electronically Signed: Hansel Feinstein, ED Scribe. 07/11/2015. 1:15 AM.   TIME SEEN: 12:48 AM   CHIEF COMPLAINT: No chief complaint on file.   LEVEL 5 CAVEAT: HPI and ROS limited due to AMS   HPI:  HPI Comments: Mark Soto is a 74 y.o. male BIBA with h/o Seizure (06/2014; likely secondary to acute left PCA infarction; pt is on Keppra), HTN, HLD, stroke, DM who presents to the Emergency Department due to a witnessed seizure with right sided gaze that occurred just PTA. Last seen normal a week ago per his wife. Per wife, the pt is confused at baseline for 4 months after a head injury and has exhibited increased confusion for a week. She states that he was moving all his extremities equally this evening. She states his is non-compliant with his Keppra and skips doses. Per EMS, the wife reports he rolled off the bed but did not hit his head. Per EMS, CBG was 178 en route. They also report projectile emesis and BP of 220/110 en route. On arrival, the pt began to seize again with a postural appearance, per EMS. Wife denies fever, cough, vomiting, diarrhea. No other recent illnesses. No anticoagulant use. Pt is a non drinker, no drug use. PCP is Dr. Tamala Julian.   PAST MEDICAL HISTORY/PAST SURGICAL HISTORY:  Past Medical History  Diagnosis Date  . Hypertension   . Tinnitus     Right Ear  . Anemia   . Abdominal wall hernia   . Personal history of colonic adenomas 04/14/2013  . 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 Seizures 06/13/2014  . 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  . Hyperlipidemia   . BPH (benign prostatic hyperplasia)   . Glucose intolerance (impaired glucose tolerance)     MEDICATIONS:  Prior to Admission medications   Medication Sig Start Date End Date Taking? Authorizing Provider  amLODipine (NORVASC) 2.5 MG tablet  Take 1 tablet (2.5 mg total) by mouth daily. 07/20/14   Wardell Honour, MD  aspirin 325 MG EC tablet Take 1 tablet (325 mg total) by mouth daily. 06/19/14   Timmothy Euler, MD  atorvastatin (LIPITOR) 80 MG tablet Take 1 tablet (80 mg total) by mouth daily at 6 PM. 07/20/14   Wardell Honour, MD  donepezil (ARICEPT) 10 MG tablet Take 1 tablet daily 09/10/14   Cameron Sprang, MD  ferrous sulfate 325 (65 FE) MG tablet Take 1 tablet (325 mg total) by mouth daily with breakfast. 12/13/14   Wardell Honour, MD  Fluoxetine HCl, PMDD, 20 MG CAPS Take 1 capsule (20 mg total) by mouth daily. 12/10/14   Wardell Honour, MD  hydrochlorothiazide (HYDRODIURIL) 25 MG tablet Take 25 mg by mouth daily.    Historical Provider, MD  levETIRAcetam (KEPPRA) 500 MG tablet Take 1 tablet (500 mg total) by mouth every 12 (twelve) hours. 07/20/14   Wardell Honour, MD  metFORMIN (GLUCOPHAGE) 500 MG tablet Take 1 tablet (500 mg total) by mouth daily with breakfast. 12/11/14   Wardell Honour, MD  tamsulosin [facility]) 0.4 MG CAPS capsule Take 1 capsule (0.4 mg total) by mouth daily after breakfast. 07/20/14   Wardell Honour, MD    ALLERGIES:  No Known Allergies  SOCIAL HISTORY:  Social History  Substance Use Topics  . Smoking  status: Never Smoker   . Smokeless tobacco: Never Used  . Alcohol Use: No    FAMILY HISTORY: Family History  Problem Relation Age of Onset  . Breast cancer Mother   . COPD Father   . Hypertension Sister   . Hypertension Brother   . Hypertension Sister   . Liver cancer Mother   . Lung cancer Mother   . Colon cancer Neg Hx   . Colon polyps Neg Hx   . Stomach cancer Neg Hx     EXAM: BP 169/80 mmHg  Pulse 96  Resp 30  SpO2 97% Rectal 99.7. CONSTITUTIONAL: Somnolent. Doesn't answer questions or follow commands.  HEAD: Normocephalic EYES: Conjunctivae clear, PERRL ENT: normal nose; no rhinorrhea; moist mucous membranes; pharynx without lesions noted NECK: Supple, no meningismus, no LAD  CARD:  RRR; S1 and S2 appreciated; no murmurs, no clicks, no rubs, no gallops RESP: Normal chest excursion without splinting; breath sounds equal bilaterally, no wheezing or rales, slightly raw progress breath sounds, no hypoxia; Tachypneic with sonorous respirations.  ABD/GI: Normal bowel sounds; non-distended; soft, non-tender, no rebound, no guarding, no peritoneal signs BACK:  The back appears normal and is non-tender to palpation, there is no CVA tenderness EXT: Normal ROM in all joints; non-tender to palpation; no edema; normal capillary refill; no cyanosis, no calf tenderness or swelling    SKIN: Normal color for age and race; warm NEURO: Somnolent, not communicative or responsive. Not answering questions. Does not follow commands. PSYCH: The patient's mood and manner are appropriate. Grooming and personal hygiene are appropriate.  MEDICAL DECISION MAKING: Patient here with altered mental status for the past week and a seizure at home prior to arrival. Patient had another seizure witnessed by nursing staff in the emergency department. He was posturing with right-sided gaze deviation. It appears he has been incontinent of urine previously. No tongue biting, dental injury. Wife reports that he has been altered for the past week. He does have a history of previous stroke causing a seizure but at this time he would be outside of any TPA window. He was given Ativan in the emergency department after his seizure. We'll also give a dose of IV Keppra given wife's report that he has been missing his Keppra. We'll obtain CT of his head to evaluate for any intracranial abnormalities. We'll obtain labs, chest x-ray and urine. I suspect patient will need admission.  ED PROGRESS: Patient's labs show leukocytosis with left shift. This may be reactive in nature but may also be related to infection. Chest x-ray shows possible bibasilar atelectasis versus infectious infiltrate. Labs show a metabolic acidosis with elevated  anion gap. Suspect this is a lactic acidosis from 2 seizures. We'll continue IV hydration, repeat lactate and chem 8. CT of the head shows no acute abnormalities.  We'll give IV ceftriaxone and azithromycin.  Blood cultures pending.    4:16 AM Consulted Dr. Armida Sans who will see pt in consult.  Consulted with Dr. Alcario Drought with hospitalist service, who will admit to inpatient step-down.  He agrees with continuing ceftriaxone and azithromycin.  I will place holding orders.  Pt's bicarb has improved to 21.     8:15 AM  Pt's lactate is still elevated at 4.15 despite 2 L of IV fluids. We'll give third liter bolus. His blood pressure is stable. We'll repeat rectal temperature.   EKG Interpretation  Date/Time:  Thursday July 11 2015 01:00:54 EST Ventricular Rate:  103 PR Interval:  186 QRS Duration: 112 QT Interval:  391 QTC Calculation: 512 R Axis:   0 Text Interpretation:  Sinus tachycardia Ventricular bigeminy Abnormal R-wave progression, early transition LVH with IVCD and secondary repol abnrm Borderline prolonged QT interval Artifact Confirmed by Kassey Laforest,  DO, Marycatherine Maniscalco [id number]) on 07/11/2015 1:03:20 AM         I personally performed the services described in this documentation, which was scribed in my presence. The recorded information has been reviewed and is accurate.   [place], DO 07/11/15 [phone]

## 2015-07-11 NOTE — ED Notes (Signed)
Admitting MD at bedside.

## 2015-07-11 NOTE — ED Notes (Signed)
Left message for the pts spouse to update on post fall status

## 2015-07-11 NOTE — Progress Notes (Signed)
Pt received from ED per stretcher. Oriented to room Confused to day and date orinted to self, birthday and that he is in the hospital CHG bath done and MRSA swab Given Nurse call bell Bed alarm on. Family at bedside.

## 2015-07-12 DIAGNOSIS — E785 Hyperlipidemia, unspecified: Secondary | ICD-10-CM

## 2015-07-12 DIAGNOSIS — I10 Essential (primary) hypertension: Secondary | ICD-10-CM | POA: Insufficient documentation

## 2015-07-12 LAB — LEGIONELLA PNEUMOPHILA SEROGP 1 UR AG: L. PNEUMOPHILA SEROGP 1 UR AG: NEGATIVE

## 2015-07-12 MED ORDER — LEVETIRACETAM 500 MG PO TABS
500.0000 mg | ORAL_TABLET | Freq: Two times a day (BID) | ORAL | Status: DC
  Administered 2015-07-12 – 2015-07-15 (×7): 500 mg via ORAL
  Filled 2015-07-12: qty 1
  Filled 2015-07-12: qty 2
  Filled 2015-07-12: qty 1
  Filled 2015-07-12: qty 2
  Filled 2015-07-12 (×3): qty 1

## 2015-07-12 MED ORDER — SODIUM CHLORIDE 0.9 % IV SOLN
INTRAVENOUS | Status: DC
  Administered 2015-07-12: 16:00:00 via INTRAVENOUS
  Administered 2015-07-13 – 2015-07-14 (×2): 1000 mL via INTRAVENOUS

## 2015-07-12 MED ORDER — DM-GUAIFENESIN ER 30-600 MG PO TB12
1.0000 | ORAL_TABLET | Freq: Two times a day (BID) | ORAL | Status: DC
  Administered 2015-07-12 – 2015-07-15 (×7): 1 via ORAL
  Filled 2015-07-12 (×7): qty 1

## 2015-07-12 NOTE — Progress Notes (Signed)
Spoke with the wife, Sydell Axon, and she stated that she don't want him to go back home.  Her first preference for placement is [place] or [facility].  She doesn't want patient's family, daughters and sisters to get involved with the transfer or anything related to the patient.

## 2015-07-12 NOTE — Progress Notes (Signed)
I tried to call Mrs. Rosio Sterner, patient's wife to inform her that patient is being transferred to [phone], message left.  I also called Ms. Ethanmichael Flores, patient's daughter but unable to get in touch with her.  Message left on her answering machine.

## 2015-07-12 NOTE — Progress Notes (Signed)
Subjective: No complaints and no further seizures.   Exam: Filed Vitals:   07/12/15 0800 07/12/15 0903  BP: 163/78 154/80  Pulse: 66   Temp: 98.1 F (36.7 C)   Resp: 15     HEENT-  Normocephalic, no lesions, without obvious abnormality.  Normal external eye and conjunctiva.  Normal TM's bilaterally.  Normal auditory canals and external ears. Normal external nose, mucus membranes and septum.  Normal pharynx. Cardiovascular- S1, S2 normal, pulses palpable throughout   Lungs- chest clear, no wheezing, rales, normal symmetric air entry Abdomen- normal findings: bowel sounds normal Extremities- no edema     Gen: In bed, NAD MS: alert and oriented to hospital and [facility].  He is not oriented to year.  CN: Perrla, EOMI, TML, Smile symmetric, Vision intact Motor: Moves all extremities antigravity Sensory: intact throughout      Etta Quill PA-C [facility] [phone]  Impression: 74 y.o. male with known symptomatic post stroke GTC seizures, here in the ED due to a cluster of his habitual seizures, most likely resulting of non adherence to treatment. CXR concerning for aspiration PNA. No further seizure on Keppra 500 mg BID  Recommend: 1) Continue current AED and may be switched to oral when able to take oral meds 2) Continue to treat PNA   Neurology will S/O    07/12/2015, 10:47 AM

## 2015-07-12 NOTE — Progress Notes (Signed)
RT instructed pt on proper use of flutter valve. Pt waqs able to demonstrate back good technique. RT will continue to monitor.

## 2015-07-12 NOTE — Evaluation (Signed)
Physical Therapy Evaluation Patient Details Name: Mark Soto MRN: [id number] DOB: 01/12/1941 Today's Date: 07/12/2015   History of Present Illness  Patient is Mark 74 y/o male presents with seizures and presumed aspiration PNA. PMH includes HTN, seizures, CVA, HLD, tinnitus.  Clinical Impression  Patient presents with functional limitations due to deficits listed in PT problem list (see below). Requires Mod Mark for ambulation today due to difficulty following commands and directional cues. Pt with poor safety awareness. High falls risk. Per wife (per MD notes), pt not able to care for patient at home. Pt would benefit from [facility] SNF to maximize independence and mobility and decrease burden of care prior to return home.     Follow Up Recommendations SNF    Equipment Recommendations  None recommended by PT    Recommendations for Other Services       Precautions / Restrictions Precautions Precautions: Fall Precaution Comments: seizure precautions Restrictions Weight Bearing Restrictions: No      Mobility  Bed Mobility Overal bed mobility: Needs Assistance Bed Mobility: Supine to Sit;Sit to Supine     Supine to sit: Modified independent (Device/Increase time);HOB elevated Sit to supine: Modified independent (Device/Increase time);HOB elevated   General bed mobility comments: Use of rails for support.   Transfers Overall transfer level: Needs assistance Equipment used: Rolling walker (2 wheeled) Transfers: Sit to/from Stand Sit to Stand: Min assist         General transfer comment: Min Mark to boost from EOB x1. Unsteady upon standing.  Ambulation/Gait Ambulation/Gait assistance: Mod assist Ambulation Distance (Feet): 40 Feet Assistive device: Rolling walker (2 wheeled) Gait Pattern/deviations: Step-through pattern;Decreased stride length;Trunk flexed;Staggering right;Staggering left;Wide base of support Gait velocity: decreased   General Gait Details: Unsteady gait  with RW too far anterior. Manual cues for RW management and for directional cues. Not able to stay within RW during ambulation.  Stairs            Wheelchair Mobility    Modified Rankin (Stroke Patients Only)       Balance Overall balance assessment: Needs assistance Sitting-balance support: Feet supported;No upper extremity supported Sitting balance-Leahy Scale: Fair     Standing balance support: During functional activity Standing balance-Leahy Scale: Poor Standing balance comment: Relient on RW for support.                             Pertinent Vitals/Pain Pain Assessment: No/denies pain    Home Living Family/patient expects to be discharged to:: Skilled nursing facility                      Prior Function Level of Independence: Independent         Comments: Pt not Mark great historian. Reports being independent at home. Per wife and notes, she is not able to care for spouse at home anymore due to recent decline.     Hand Dominance        Extremity/Trunk Assessment   Upper Extremity Assessment: Defer to OT evaluation           Lower Extremity Assessment: Generalized weakness         Communication      Cognition Arousal/Alertness: Awake/alert Behavior During Therapy: WFL for tasks assessed/performed;Impulsive Overall Cognitive Status: Impaired/Different from baseline Area of Impairment: Safety/judgement;Memory     Memory: Decreased short-term memory   Safety/Judgement: Decreased awareness of safety;Decreased awareness of deficits  General Comments      Exercises        Assessment/Plan    PT Assessment Patient needs continued PT services  PT Diagnosis Difficulty walking;Generalized weakness;Altered mental status   PT Problem List Decreased strength;Decreased cognition;Decreased activity tolerance;Decreased balance;Decreased mobility;Decreased knowledge of use of DME  PT Treatment Interventions Balance  training;Gait training;Functional mobility training;Therapeutic activities;Therapeutic exercise;Patient/family education   PT Goals (Current goals can be found in the Care Plan section) Acute Rehab PT Goals Patient Stated Goal: to get out of this bed PT Goal Formulation: With patient Time For Goal Achievement: 07/26/15 Potential to Achieve Goals: Good    Frequency Min 2X/week   Barriers to discharge Decreased caregiver support;Inaccessible home environment Wife cannot care for pt anymore    Co-evaluation               End of Session Equipment Utilized During Treatment: Gait belt Activity Tolerance: Patient tolerated treatment well Patient left: in bed;with call bell/phone within reach;with bed alarm set Nurse Communication: Mobility status         Time: [id number] PT Time Calculation (min) (ACUTE ONLY): 18 min   Charges:   PT Evaluation $Initial PT Evaluation Tier I: 1 Procedure     PT G Codes:        Mark Soto Mark Soto 07/12/2015, 4:35 PM Mark Soto, Mark Soto, Mark Soto [phone]

## 2015-07-12 NOTE — Care Management Note (Signed)
Case Management Note  Patient Details  Name: Mark Soto MRN: [id number] Date of Birth: November 23, 1940  Subjective/Objective:   Date: 07/12/15 Spoke with patient at the bedside, spoke with wife on the phone.  Introduced self as Tourist information centre manager and explained role in discharge planning and how to be reached.  Verified patient lives in town, alone with spouse. Spouse states she can not take care of husband and if he comes home she will not be able to do anything for him and his children will not be able to help him either. Expressed potential need for no other DME.  Verified patient wife  Anticipates patient to go to SNF at time of discharge because he will not have any  supervision by family.  Patient denied needing help with their medication.  Patient is driven by wife  to MD appointments.  Verified patient has  PCP Tamala Julian. Await pt eval.  CSW aware.  Plan: CM will continue to follow for discharge planning and [facility] resources.                  Action/Plan:   Expected Discharge Date:                  Expected Discharge Plan:  [place]  In-House Referral:     Discharge planning Services  CM Consult  Post Acute Care Choice:    Choice offered to:     DME Arranged:    DME Agency:     HH Arranged:    [place] Agency:     Status of Service:  In process, will continue to follow  Medicare Important Message Given:    Date Medicare IM Given:    Medicare IM give by:    Date Additional Medicare IM Given:    Additional Medicare Important Message give by:     If discussed at [place] of Stay Meetings, dates discussed:    Additional Comments:  [name], RN 07/12/2015, 4:12 PM

## 2015-07-12 NOTE — Progress Notes (Signed)
[facility] PROGRESS NOTE  Mark Soto [id number] DOB: 1940-10-30 DOA: 07/11/2015 PCP: Reginia Forts, MD  Assessment/Plan: 1-seizure activity with post ictal state: Patient was initiated on IV Keppra following neurology recommendations and has not had any further seizure activity since. -Mentation is improving -Will continue Keppra and transition to oral regimen -Patient will be transferred out of the stepdown and have physical therapy and occupational therapy evaluation.  2-Aspiration PNA: related to seizure most likely -will continue zosyn -will add mucinex and flutter valve -follow clinical response  3-high anion gap metabolic acidosis: most likely associated with seizure -will repeat lactic acid and electrolytes; overall improving and trending down  -continue IVF's  4-leukocytosis: demargination and infection -will continue antibiotics -will follow trend   5-essential HTN: fairly well controlled -will continue home antihypertensive regimen  -adjust regimen as needed  6-HLD: will continue statins      Code Status: Full code Family Communication: No family at bedside Disposition Plan: Will transfer out of the stepdown unit, physical therapy/occupational therapy to provide assessment in order to investigate patient's needs and capacity to perform activities of daily living. Continue IV antibiotics.   Consultants:  Neurology  Procedures:  Abnormal EEG: due to the presence of occasional left temporal sharp wave activity indicating a potential seizure foci.  Antibiotics:  Zosyn 07/11/15  HPI/Subjective: Patient with improved mentation, denies chest pain, no respiratory distress and no further seizure activities appreciated  Objective: Filed Vitals:   07/12/15 0903 07/12/15 1200  BP: 154/80 154/76  Pulse:  66  Temp:  97.9 F (36.6 C)  Resp:  16    Intake/Output Summary (Last 24 hours) at 07/12/15 1344 Last data filed at 07/12/15 0600  Gross  per 24 hour  Intake   1630 ml  Output    550 ml  Net   1080 ml   Filed Weights   07/11/15 1806  Weight: 108.9 kg (240 lb 1.3 oz)    Exam:   General:  Afebrile, feeling better, no further seizure activity. Patient denies chest pain  Cardiovascular: S1 and S2, no rubs, no gallops  Respiratory: Scattered rhonchi, no wheezing, good air movement.  Abdomen: Soft, nontender, nondistended, positive bowel sounds  Musculoskeletal: No edema, no cyanosis  Data Reviewed: Basic Metabolic Panel:  Recent Labs Lab 07/11/15 0126 07/11/15 0129 07/11/15 0448  NA 138 139 140  K 4.1 3.8 4.0  CL 103 107 106  CO2 10*  --   --   GLUCOSE 218* 212* 127*  BUN 18 22* 19  CREATININE 1.38* 1.10 1.10  CALCIUM 9.0  --   --    Liver Function Tests:  Recent Labs Lab 07/11/15 0126  AST 29  ALT 12*  ALKPHOS 86  BILITOT 0.8  PROT 7.2  ALBUMIN 4.0   CBC:  Recent Labs Lab 07/11/15 0126 07/11/15 0129 07/11/15 0448  WBC 21.0*  --   --   NEUTROABS 16.1*  --   --   HGB 12.2* 13.6 12.9*  HCT 38.8* 40.0 38.0*  MCV 77.0*  --   --   PLT 226  --   --    CBG:  Recent Labs Lab 07/11/15 1212  GLUCAP 78    Recent Results (from the past 240 hour(s))  Blood culture (routine x 2)     Status: None (Preliminary result)   Collection Time: 07/11/15  3:30 AM  Result Value Ref Range Status   Specimen Description BLOOD RIGHT HAND  Final   Special Requests BOTTLES DRAWN AEROBIC AND ANAEROBIC  5CC  Final   Culture NO GROWTH 1 DAY  Final   Report Status PENDING  Incomplete  Blood culture (routine x 2)     Status: None (Preliminary result)   Collection Time: 07/11/15  3:40 AM  Result Value Ref Range Status   Specimen Description BLOOD RIGHT ARM  Final   Special Requests BOTTLES DRAWN AEROBIC AND ANAEROBIC 5CC  Final   Culture NO GROWTH 1 DAY  Final   Report Status PENDING  Incomplete  MRSA PCR Screening     Status: None   Collection Time: 07/11/15  6:46 PM  Result Value Ref Range Status    MRSA by PCR NEGATIVE NEGATIVE Final    Comment:        The GeneXpert MRSA Assay (FDA approved for NASAL specimens only), is one component of a comprehensive MRSA colonization surveillance program. It is not intended to diagnose MRSA infection nor to guide or monitor treatment for MRSA infections.      Studies: Ct Head Wo Contrast  07/11/2015  CLINICAL DATA:  Witnessed seizure. Last seen normal a week ago. Baseline confusion has worsened. EXAM: CT HEAD WITHOUT CONTRAST TECHNIQUE: Contiguous axial images were obtained from the base of the skull through the vertex without intravenous contrast. COMPARISON:  06/13/2014 FINDINGS: There is no intracranial hemorrhage, mass or evidence of acute infarction. There is no extra-axial fluid collection. There is moderately severe generalized atrophy. Moderately severe periventricular white matter hypodensity is likely due to chronic small vessel ischemic disease. There is remote lacunar infarction in the right thalamus. There is remote left occipital infarction. No acute intracranial findings are evident. No bone abnormalities evident. The visible paranasal sinuses are clear. IMPRESSION: Generalized atrophy and moderately severe chronic white matter changes likely due to small vessel disease. Remote infarctions. No acute intracranial findings. Electronically Signed   By: Andreas Newport M.D.   On: 07/11/2015 01:52   Dg Chest Portable 1 View  07/11/2015  CLINICAL DATA:  Seizure.  Dyspnea. EXAM: PORTABLE CHEST 1 VIEW COMPARISON:  01/29/2013 FINDINGS: Basilar opacities are present bilaterally due to atelectasis or infectious infiltrate. No large effusions. No pneumothorax. IMPRESSION: Bibasilar atelectasis or infectious infiltrate. Aspiration not excluded. Electronically Signed   By: Andreas Newport M.D.   On: 07/11/2015 02:54    Scheduled Meds: . amLODipine  2.5 mg Oral Daily  . aspirin  325 mg Oral Daily  . atorvastatin  80 mg Oral q1800  .  azithromycin  500 mg Intravenous Q24H  . ferrous sulfate  325 mg Oral Q breakfast  . heparin  5,000 Units Subcutaneous 3 times per day  . levETIRAcetam  500 mg Intravenous BID  . piperacillin-tazobactam (ZOSYN)  IV  3.375 g Intravenous Q8H   Continuous Infusions: . sodium chloride 125 mL/hr at 07/12/15 0430    Active Problems:   Altered mental status   Seizures ([place])   CAP (community acquired pneumonia)   High anion gap metabolic acidosis   Aspiration pneumonia ([place])    Time spent: 35 minutes    Barton Dubois  [facility] Pager [phone]  If 7PM-7AM, please contact night-coverage at [email or web address], password [facility] 07/12/2015, 1:44 PM  LOS: 1 day

## 2015-07-13 LAB — CBC
HCT: 33.1 % — ABNORMAL LOW (ref 39.0–52.0)
Hemoglobin: 10.7 g/dL — ABNORMAL LOW (ref 13.0–17.0)
MCH: 23.7 pg — AB (ref 26.0–34.0)
MCHC: 32.3 g/dL (ref 30.0–36.0)
MCV: 73.4 fL — ABNORMAL LOW (ref 78.0–100.0)
PLATELETS: 170 10*3/uL (ref 150–400)
RBC: 4.51 MIL/uL (ref 4.22–5.81)
RDW: 16.4 % — ABNORMAL HIGH (ref 11.5–15.5)
WBC: 7.4 10*3/uL (ref 4.0–10.5)

## 2015-07-13 LAB — BASIC METABOLIC PANEL
Anion gap: 7 (ref 5–15)
BUN: 14 mg/dL (ref 6–20)
CO2: 23 mmol/L (ref 22–32)
CREATININE: 1.17 mg/dL (ref 0.61–1.24)
Calcium: 8.3 mg/dL — ABNORMAL LOW (ref 8.9–10.3)
Chloride: 112 mmol/L — ABNORMAL HIGH (ref 101–111)
Glucose, Bld: 111 mg/dL — ABNORMAL HIGH (ref 65–99)
POTASSIUM: 3.7 mmol/L (ref 3.5–5.1)
SODIUM: 142 mmol/L (ref 135–145)

## 2015-07-13 LAB — LACTIC ACID, PLASMA: LACTIC ACID, VENOUS: 0.6 mmol/L (ref 0.5–2.0)

## 2015-07-13 MED ORDER — HYDRALAZINE HCL 20 MG/ML IJ SOLN
10.0000 mg | Freq: Once | INTRAMUSCULAR | Status: AC
  Administered 2015-07-14: 10 mg via INTRAVENOUS
  Filled 2015-07-13: qty 1

## 2015-07-13 MED ORDER — HYDRALAZINE HCL 20 MG/ML IJ SOLN
20.0000 mg | Freq: Four times a day (QID) | INTRAMUSCULAR | Status: DC | PRN
  Administered 2015-07-14 (×2): 20 mg via INTRAVENOUS
  Filled 2015-07-13 (×3): qty 1

## 2015-07-13 MED ORDER — HYDRALAZINE HCL 20 MG/ML IJ SOLN
10.0000 mg | Freq: Four times a day (QID) | INTRAMUSCULAR | Status: DC | PRN
  Administered 2015-07-13: 10 mg via INTRAVENOUS
  Filled 2015-07-13: qty 1

## 2015-07-13 MED ORDER — AMLODIPINE BESYLATE 5 MG PO TABS
5.0000 mg | ORAL_TABLET | Freq: Every day | ORAL | Status: DC
Start: 2015-07-13 — End: 2015-07-14
  Administered 2015-07-13: 5 mg via ORAL
  Filled 2015-07-13 (×2): qty 1

## 2015-07-13 NOTE — Progress Notes (Signed)
Patient's b/p still elevated after PRN dose of hydralazine. NP paged at this time

## 2015-07-13 NOTE — Progress Notes (Signed)
B/p med ordered and given. Will reassess

## 2015-07-13 NOTE — Progress Notes (Signed)
NP on call notified concerning night b/p.

## 2015-07-13 NOTE — Progress Notes (Signed)
[facility] PROGRESS NOTE  Mark Soto [id number] DOB: 1941/06/04 DOA: 07/11/2015 PCP: Reginia Forts, MD  Assessment/Plan: 1-seizure activity with post ictal state: Patient was initiated on IV Keppra following neurology recommendations and has not had any further seizure activity since. -Mentation is improving -Will continue PO  Keppra. PT recommends SNF, will place social work consultation.  2-Aspiration PNA: related to seizure most likely -will continue zosyn, blood culture negative so far, transition to by mouth Augmentin in a.m. Continue mucinex and flutter valve -follow clinical response  3-high anion gap metabolic acidosis: most likely associated with seizure -will repeat lactic acid and electrolytes; overall improving and trending down  -continue IVF's  4-leukocytosis: demargination and infection -will continue antibiotics -will follow trend   5-essential HTN: Uncontrolled, increase Norvasc -will continue home antihypertensive regimen     6-HLD: will continue statins      Code Status: Full code Family Communication: No family at bedside Disposition Plan: PT recommends SNF. Continue IV antibiotics.   Consultants:  Neurology  Procedures:  Abnormal EEG: due to the presence of occasional left temporal sharp wave activity indicating a potential seizure foci.  Antibiotics:  Zosyn 07/11/15  HPI/Subjective:  no further seizure  slightly hypertensive  Objective: Filed Vitals:   07/12/15 2257 07/13/15 0529  BP: 162/71 158/74  Pulse: 68 73  Temp:  98.9 F (37.2 C)  Resp:  15    Intake/Output Summary (Last 24 hours) at 07/13/15 0905 Last data filed at 07/13/15 0529  Gross per 24 hour  Intake 1455.33 ml  Output   2900 ml  Net -1444.67 ml   Filed Weights   07/11/15 1806  Weight: 108.9 kg (240 lb 1.3 oz)    Exam:   General:  Afebrile, feeling better, no further seizure activity. Patient denies chest pain  Cardiovascular: S1 and S2,  no rubs, no gallops  Respiratory: Scattered rhonchi, no wheezing, good air movement.  Abdomen: Soft, nontender, nondistended, positive bowel sounds  Musculoskeletal: No edema, no cyanosis  Data Reviewed: Basic Metabolic Panel:  Recent Labs Lab 07/11/15 0126 07/11/15 0129 07/11/15 0448 07/13/15 0512  NA 138 139 140 142  K 4.1 3.8 4.0 3.7  CL 103 107 106 112*  CO2 10*  --   --  23  GLUCOSE 218* 212* 127* 111*  BUN 18 22* 19 14  CREATININE 1.38* 1.10 1.10 1.17  CALCIUM 9.0  --   --  8.3*   Liver Function Tests:  Recent Labs Lab 07/11/15 0126  AST 29  ALT 12*  ALKPHOS 86  BILITOT 0.8  PROT 7.2  ALBUMIN 4.0   CBC:  Recent Labs Lab 07/11/15 0126 07/11/15 0129 07/11/15 0448 07/13/15 0512  WBC 21.0*  --   --  7.4  NEUTROABS 16.1*  --   --   --   HGB 12.2* 13.6 12.9* 10.7*  HCT 38.8* 40.0 38.0* 33.1*  MCV 77.0*  --   --  73.4*  PLT 226  --   --  170   CBG:  Recent Labs Lab 07/11/15 1212  GLUCAP 78    Recent Results (from the past 240 hour(s))  Blood culture (routine x 2)     Status: None (Preliminary result)   Collection Time: 07/11/15  3:30 AM  Result Value Ref Range Status   Specimen Description BLOOD RIGHT HAND  Final   Special Requests BOTTLES DRAWN AEROBIC AND ANAEROBIC 5CC  Final   Culture NO GROWTH 1 DAY  Final   Report Status PENDING  Incomplete  Blood culture (routine x 2)     Status: None (Preliminary result)   Collection Time: 07/11/15  3:40 AM  Result Value Ref Range Status   Specimen Description BLOOD RIGHT ARM  Final   Special Requests BOTTLES DRAWN AEROBIC AND ANAEROBIC 5CC  Final   Culture NO GROWTH 1 DAY  Final   Report Status PENDING  Incomplete  MRSA PCR Screening     Status: None   Collection Time: 07/11/15  6:46 PM  Result Value Ref Range Status   MRSA by PCR NEGATIVE NEGATIVE Final    Comment:        The GeneXpert MRSA Assay (FDA approved for NASAL specimens only), is one component of a comprehensive MRSA  colonization surveillance program. It is not intended to diagnose MRSA infection nor to guide or monitor treatment for MRSA infections.      Studies: No results found.  Scheduled Meds: . amLODipine  2.5 mg Oral Daily  . aspirin  325 mg Oral Daily  . atorvastatin  80 mg Oral q1800  . dextromethorphan-guaiFENesin  1 tablet Oral BID  . ferrous sulfate  325 mg Oral Q breakfast  . heparin  5,000 Units Subcutaneous 3 times per day  . levETIRAcetam  500 mg Oral BID  . piperacillin-tazobactam (ZOSYN)  IV  3.375 g Intravenous Q8H   Continuous Infusions: . sodium chloride 125 mL/hr at 07/12/15 1427  . sodium chloride 100 mL/hr at 07/12/15 1613    Active Problems:   Altered mental status   Seizures ([facility])   CAP (community acquired pneumonia)   High anion gap metabolic acidosis   Aspiration pneumonia ([facility])   Benign essential HTN    Time spent: 35 minutes    [facility]  [facility] Pager [phone]  If 7PM-7AM, please contact night-coverage at [email or web address], password [facility] 07/13/2015, 9:05 AM  LOS: 2 days

## 2015-07-14 DIAGNOSIS — R400 Somnolence: Secondary | ICD-10-CM

## 2015-07-14 DIAGNOSIS — I10 Essential (primary) hypertension: Secondary | ICD-10-CM

## 2015-07-14 DIAGNOSIS — E872 Acidosis: Secondary | ICD-10-CM

## 2015-07-14 DIAGNOSIS — J690 Pneumonitis due to inhalation of food and vomit: Secondary | ICD-10-CM

## 2015-07-14 DIAGNOSIS — R569 Unspecified convulsions: Principal | ICD-10-CM

## 2015-07-14 DIAGNOSIS — J189 Pneumonia, unspecified organism: Secondary | ICD-10-CM

## 2015-07-14 LAB — COMPREHENSIVE METABOLIC PANEL
ALBUMIN: 2.9 g/dL — AB (ref 3.5–5.0)
ALT: 31 U/L (ref 17–63)
ANION GAP: 7 (ref 5–15)
AST: 113 U/L — ABNORMAL HIGH (ref 15–41)
Alkaline Phosphatase: 77 U/L (ref 38–126)
BILIRUBIN TOTAL: 1.5 mg/dL — AB (ref 0.3–1.2)
BUN: 8 mg/dL (ref 6–20)
CHLORIDE: 111 mmol/L (ref 101–111)
CO2: 23 mmol/L (ref 22–32)
Calcium: 8.4 mg/dL — ABNORMAL LOW (ref 8.9–10.3)
Creatinine, Ser: 1.09 mg/dL (ref 0.61–1.24)
GFR calc Af Amer: >60 mL/min (ref >60)
GFR calc non Af Amer: >60 mL/min (ref >60)
GLUCOSE: 114 mg/dL — AB (ref 65–99)
POTASSIUM: 3.9 mmol/L (ref 3.5–5.1)
SODIUM: 141 mmol/L (ref 135–145)
TOTAL PROTEIN: 6.1 g/dL — AB (ref 6.5–8.1)

## 2015-07-14 LAB — CBC
HEMATOCRIT: 35.1 % — AB (ref 39.0–52.0)
Hemoglobin: 11.4 g/dL — ABNORMAL LOW (ref 13.0–17.0)
MCH: 23.8 pg — ABNORMAL LOW (ref 26.0–34.0)
MCHC: 32.5 g/dL (ref 30.0–36.0)
MCV: 73.4 fL — ABNORMAL LOW (ref 78.0–100.0)
PLATELETS: 185 10*3/uL (ref 150–400)
RBC: 4.78 MIL/uL (ref 4.22–5.81)
RDW: 16.4 % — AB (ref 11.5–15.5)
WBC: 8.2 10*3/uL (ref 4.0–10.5)

## 2015-07-14 LAB — MAGNESIUM: Magnesium: 1.8 mg/dL (ref 1.7–2.4)

## 2015-07-14 MED ORDER — HYDRALAZINE HCL 10 MG PO TABS
10.0000 mg | ORAL_TABLET | Freq: Two times a day (BID) | ORAL | Status: DC
  Administered 2015-07-14 – 2015-07-15 (×3): 10 mg via ORAL
  Filled 2015-07-14 (×3): qty 1

## 2015-07-14 MED ORDER — AMOXICILLIN-POT CLAVULANATE 875-125 MG PO TABS
1.0000 | ORAL_TABLET | Freq: Two times a day (BID) | ORAL | Status: DC
  Administered 2015-07-14 – 2015-07-15 (×3): 1 via ORAL
  Filled 2015-07-14 (×3): qty 1

## 2015-07-14 MED ORDER — AMLODIPINE BESYLATE 10 MG PO TABS
10.0000 mg | ORAL_TABLET | Freq: Every day | ORAL | Status: DC
  Administered 2015-07-14 – 2015-07-15 (×2): 10 mg via ORAL
  Filled 2015-07-14 (×2): qty 1

## 2015-07-14 MED ORDER — METOPROLOL TARTRATE 25 MG PO TABS
25.0000 mg | ORAL_TABLET | Freq: Two times a day (BID) | ORAL | Status: DC
  Administered 2015-07-14 – 2015-07-15 (×3): 25 mg via ORAL
  Filled 2015-07-14: qty 2
  Filled 2015-07-14 (×2): qty 1

## 2015-07-14 NOTE — Care Management Important Message (Signed)
Important Message  Patient Details  Name: Mark Soto MRN: [id number] Date of Birth: 11/01/1940   Medicare Important Message Given:  Yes    Rashard Ryle Abena 07/14/2015, 11:06 AM

## 2015-07-14 NOTE — Progress Notes (Signed)
[facility]                                                                              Patient Demographics  Mark Soto, is a 74 y.o. male, DOB - 1941/03/06, [id number]  Admit date - 07/11/2015   Admitting Physician Etta Quill, DO  Outpatient Primary MD for the patient is SMITH,KRISTI, MD  LOS - 3   Chief Complaint  Patient presents with  . Seizures       Brief HPI   Mark Soto is a 74 y.o. male with h/o seizures since CVA in 2015. Has had increasing confusion for the past week prior to admission and suffered 2 back to back seizures at home on the day of admission. Patient had 3rd seizure in ED and was given 2mg  IV ativan.Work up in the ED Chest x-ray with bibasilar infiltrates, WBC of 21k with a mild left shift as well bicarbonate 10 Patient was admitted for further workup.  Assessment & Plan    Principal problems Seizure activity with post ictal state: Likely due to noncompliance to treatment - Neurology was consulted and patient was started on IV Keppra - No further seizures since admission, mental status back to baseline - PT recommends SNF, social work consultation placed. - CT head showed generalized atrophy and moderately severe chronic white matter changes likely due to small vessel disease. No acute infarct.   Active problems Aspiration PNA: related to seizure most likely -Zosyn discontinued, blood cultures negative so far, transition to oral Augmentin.  - Continue mucinex and flutter valve  High anion gap metabolic acidosis: Due to lactic acidosis from multiple seizures  -Lactic acid improved, patient was placed on gentle hydration. Gap now closed.  Leukocytosis: demargination and infection, resolved -will continue antibiotics  Accelerated hypertension -BP still not controlled, increased Norvasc to 10 mg daily, added metoprolol and hydralazine  Hyperlipidemia  will continue statins   Code Status: fc   Family  Communication: Discussed in detail with the patient, all imaging results, lab results explained to the patient   Disposition Plan:  SNF pending bed available  Time Spent in minutes   25 minutes  Procedures  CT head  Consults   neuro  DVT Prophylaxis  heparin  Medications  Scheduled Meds: . amLODipine  10 mg Oral Daily  . amoxicillin-clavulanate  1 tablet Oral Q12H  . aspirin  325 mg Oral Daily  . atorvastatin  80 mg Oral q1800  . dextromethorphan-guaiFENesin  1 tablet Oral BID  . ferrous sulfate  325 mg Oral Q breakfast  . heparin  5,000 Units Subcutaneous 3 times per day  . hydrALAZINE  10 mg Oral BID  . levETIRAcetam  500 mg Oral BID  . metoprolol tartrate  25 mg Oral BID   Continuous Infusions:  PRN Meds:.hydrALAZINE   Antibiotics   Anti-infectives    Start     Dose/Rate Route Frequency Ordered Stop   07/14/15 1000  amoxicillin-clavulanate (AUGMENTIN) 875-125 MG per tablet 1 tablet     1 tablet Oral Every 12 hours 07/14/15 0946     07/12/15 0400  cefTRIAXone (ROCEPHIN) 1 g in dextrose  5 % 50 mL IVPB  Status:  Discontinued     1 g 100 mL/hr over 30 Minutes Intravenous Every 24 hours 07/11/15 0435 07/11/15 1843   07/12/15 0400  azithromycin (ZITHROMAX) 500 mg in dextrose 5 % 250 mL IVPB  Status:  Discontinued     500 mg 250 mL/hr over 60 Minutes Intravenous Every 24 hours 07/11/15 0435 07/12/15 1523   07/12/15 0400  piperacillin-tazobactam (ZOSYN) IVPB 3.375 g  Status:  Discontinued     3.375 g 12.5 mL/hr over 240 Minutes Intravenous Every 8 hours 07/11/15 1851 07/14/15 0946   07/11/15 1900  piperacillin-tazobactam (ZOSYN) IVPB 3.375 g     3.375 g 12.5 mL/hr over 240 Minutes Intravenous NOW 07/11/15 1851 07/12/15 0003   07/11/15 0315  cefTRIAXone (ROCEPHIN) 1 g in dextrose 5 % 50 mL IVPB     1 g 100 mL/hr over 30 Minutes Intravenous  Once 07/11/15 0303 07/11/15 0522   07/11/15 0315  azithromycin (ZITHROMAX) 500 mg in dextrose 5 % 250 mL IVPB     500 mg 250  mL/hr over 60 Minutes Intravenous  Once 07/11/15 0303 07/11/15 [id number]        Subjective:   Mark Soto was seen and examined today. Feeling overall better, sitting up in the chair and eating breakfast without any difficulty. Patient denies dizziness, chest pain, shortness of breath, abdominal pain, N/V/D/C, new weakness, numbess, tingling. No acute events overnight.    Objective:   Blood pressure 160/73, pulse 64, temperature 97.6 F (36.4 C), temperature source Oral, resp. rate 20, height 6' (1.829 m), weight 110.224 kg (243 lb), SpO2 100 %.  Wt Readings from Last 3 Encounters:  07/13/15 110.224 kg (243 lb)  01/16/15 108.863 kg (240 lb)  12/10/14 107.775 kg (237 lb 9.6 oz)     Intake/Output Summary (Last 24 hours) at 07/14/15 1407 Last data filed at 07/14/15 1202  Gross per 24 hour  Intake 1213.33 ml  Output   4300 ml  Net -3086.67 ml    Exam  General: Alert and oriented x 3, NAD  HEENT:  PERRLA, EOMI, Anicteric Sclera, mucous membranes moist.   Neck: Supple, no JVD, no masses  CVS: S1 S2 auscultated, no rubs, murmurs or gallops. Regular rate and rhythm.  Respiratory: Clear to auscultation bilaterally, no wheezing, rales or rhonchi  Abdomen: Soft, nontender, nondistended, + bowel sounds  Ext: no cyanosis clubbing or edema  Neuro: AAOx3, Cr N's II- XII. Strength 5/5 upper and lower extremities bilaterally  Skin: No rashes  Psych: Normal affect and demeanor, alert and oriented x3    Data Review   Micro Results Recent Results (from the past 240 hour(s))  Blood culture (routine x 2)     Status: None (Preliminary result)   Collection Time: 07/11/15  3:30 AM  Result Value Ref Range Status   Specimen Description BLOOD RIGHT HAND  Final   Special Requests BOTTLES DRAWN AEROBIC AND ANAEROBIC 5CC  Final   Culture NO GROWTH 2 DAYS  Final   Report Status PENDING  Incomplete  Blood culture (routine x 2)     Status: None (Preliminary result)   Collection Time:  07/11/15  3:40 AM  Result Value Ref Range Status   Specimen Description BLOOD RIGHT ARM  Final   Special Requests BOTTLES DRAWN AEROBIC AND ANAEROBIC 5CC  Final   Culture NO GROWTH 2 DAYS  Final   Report Status PENDING  Incomplete  MRSA PCR Screening     Status: None  Collection Time: 07/11/15  6:46 PM  Result Value Ref Range Status   MRSA by PCR NEGATIVE NEGATIVE Final    Comment:        The GeneXpert MRSA Assay (FDA approved for NASAL specimens only), is one component of a comprehensive MRSA colonization surveillance program. It is not intended to diagnose MRSA infection nor to guide or monitor treatment for MRSA infections.     Radiology Reports Ct Head Wo Contrast  07/11/2015  CLINICAL DATA:  Witnessed seizure. Last seen normal a week ago. Baseline confusion has worsened. EXAM: CT HEAD WITHOUT CONTRAST TECHNIQUE: Contiguous axial images were obtained from the base of the skull through the vertex without intravenous contrast. COMPARISON:  06/13/2014 FINDINGS: There is no intracranial hemorrhage, mass or evidence of acute infarction. There is no extra-axial fluid collection. There is moderately severe generalized atrophy. Moderately severe periventricular white matter hypodensity is likely due to chronic small vessel ischemic disease. There is remote lacunar infarction in the right thalamus. There is remote left occipital infarction. No acute intracranial findings are evident. No bone abnormalities evident. The visible paranasal sinuses are clear. IMPRESSION: Generalized atrophy and moderately severe chronic white matter changes likely due to small vessel disease. Remote infarctions. No acute intracranial findings. Electronically Signed   By: Andreas Newport M.D.   On: 07/11/2015 01:52   Dg Chest Portable 1 View  07/11/2015  CLINICAL DATA:  Seizure.  Dyspnea. EXAM: PORTABLE CHEST 1 VIEW COMPARISON:  01/29/2013 FINDINGS: Basilar opacities are present bilaterally due to atelectasis or  infectious infiltrate. No large effusions. No pneumothorax. IMPRESSION: Bibasilar atelectasis or infectious infiltrate. Aspiration not excluded. Electronically Signed   By: Andreas Newport M.D.   On: 07/11/2015 02:54    CBC  Recent Labs Lab 07/11/15 0126 07/11/15 0129 07/11/15 0448 07/13/15 0512 07/14/15 0554  WBC 21.0*  --   --  7.4 8.2  HGB 12.2* 13.6 12.9* 10.7* 11.4*  HCT 38.8* 40.0 38.0* 33.1* 35.1*  PLT 226  --   --  170 185  MCV 77.0*  --   --  73.4* 73.4*  MCH 24.2*  --   --  23.7* 23.8*  MCHC 31.4  --   --  32.3 32.5  RDW 16.5*  --   --  16.4* 16.4*  LYMPHSABS 3.8  --   --   --   --   MONOABS 1.1*  --   --   --   --   EOSABS 0.0  --   --   --   --   BASOSABS 0.0  --   --   --   --     Chemistries   Recent Labs Lab 07/11/15 0126 07/11/15 0129 07/11/15 0448 07/13/15 0512 07/14/15 0554  NA 138 139 140 142 141  K 4.1 3.8 4.0 3.7 3.9  CL 103 107 106 112* 111  CO2 10*  --   --  23 23  GLUCOSE 218* 212* 127* 111* 114*  BUN 18 22* 19 14 8   CREATININE 1.38* 1.10 1.10 1.17 1.09  CALCIUM 9.0  --   --  8.3* 8.4*  MG  --   --   --   --  1.8  AST 29  --   --   --  113*  ALT 12*  --   --   --  31  ALKPHOS 86  --   --   --  77  BILITOT 0.8  --   --   --  1.5*   ------------------------------------------------------------------------------------------------------------------ estimated creatinine clearance is 77.3 mL/min (by C-G formula based on Cr of 1.09). ------------------------------------------------------------------------------------------------------------------ No results for input(s): HGBA1C in the last 72 hours. ------------------------------------------------------------------------------------------------------------------ No results for input(s): CHOL, HDL, LDLCALC, TRIG, CHOLHDL, LDLDIRECT in the last 72 hours. ------------------------------------------------------------------------------------------------------------------ No results for input(s):  TSH, T4TOTAL, T3FREE, THYROIDAB in the last 72 hours.  Invalid input(s): FREET3 ------------------------------------------------------------------------------------------------------------------ No results for input(s): VITAMINB12, FOLATE, FERRITIN, TIBC, IRON, RETICCTPCT in the last 72 hours.  Coagulation profile  Recent Labs Lab 07/11/15 0126  INR 1.18    No results for input(s): DDIMER in the last 72 hours.  Cardiac Enzymes No results for input(s): CKMB, TROPONINI, MYOGLOBIN in the last 168 hours.  Invalid input(s): CK ------------------------------------------------------------------------------------------------------------------ Invalid input(s): POCBNP  No results for input(s): GLUCAP in the last 72 hours.   RAI,RIPUDEEP M.D. [facility] 07/14/2015, 2:07 PM  Pager: [phone] Between 7am to 7pm - call Pager - [phone]  After 7pm go to [email or web address] - password [email or web address]  Call night coverage person covering after 7pm

## 2015-07-15 DIAGNOSIS — G4089 Other seizures: Secondary | ICD-10-CM | POA: Diagnosis not present

## 2015-07-15 DIAGNOSIS — N400 Enlarged prostate without lower urinary tract symptoms: Secondary | ICD-10-CM | POA: Diagnosis not present

## 2015-07-15 DIAGNOSIS — D72829 Elevated white blood cell count, unspecified: Secondary | ICD-10-CM | POA: Diagnosis not present

## 2015-07-15 DIAGNOSIS — E119 Type 2 diabetes mellitus without complications: Secondary | ICD-10-CM | POA: Diagnosis not present

## 2015-07-15 DIAGNOSIS — G40909 Epilepsy, unspecified, not intractable, without status epilepticus: Secondary | ICD-10-CM | POA: Diagnosis not present

## 2015-07-15 DIAGNOSIS — E089 Diabetes mellitus due to underlying condition without complications: Secondary | ICD-10-CM | POA: Diagnosis not present

## 2015-07-15 DIAGNOSIS — H25813 Combined forms of age-related cataract, bilateral: Secondary | ICD-10-CM | POA: Diagnosis not present

## 2015-07-15 DIAGNOSIS — R400 Somnolence: Secondary | ICD-10-CM | POA: Diagnosis not present

## 2015-07-15 DIAGNOSIS — H524 Presbyopia: Secondary | ICD-10-CM | POA: Diagnosis not present

## 2015-07-15 DIAGNOSIS — R569 Unspecified convulsions: Secondary | ICD-10-CM | POA: Diagnosis not present

## 2015-07-15 DIAGNOSIS — E785 Hyperlipidemia, unspecified: Secondary | ICD-10-CM | POA: Diagnosis not present

## 2015-07-15 DIAGNOSIS — H04123 Dry eye syndrome of bilateral lacrimal glands: Secondary | ICD-10-CM | POA: Diagnosis not present

## 2015-07-15 DIAGNOSIS — E872 Acidosis: Secondary | ICD-10-CM | POA: Diagnosis not present

## 2015-07-15 DIAGNOSIS — J690 Pneumonitis due to inhalation of food and vomit: Secondary | ICD-10-CM | POA: Diagnosis not present

## 2015-07-15 DIAGNOSIS — R2689 Other abnormalities of gait and mobility: Secondary | ICD-10-CM | POA: Diagnosis not present

## 2015-07-15 DIAGNOSIS — I10 Essential (primary) hypertension: Secondary | ICD-10-CM | POA: Diagnosis not present

## 2015-07-15 DIAGNOSIS — Z7984 Long term (current) use of oral hypoglycemic drugs: Secondary | ICD-10-CM | POA: Diagnosis not present

## 2015-07-15 DIAGNOSIS — J189 Pneumonia, unspecified organism: Secondary | ICD-10-CM | POA: Diagnosis not present

## 2015-07-15 DIAGNOSIS — E1149 Type 2 diabetes mellitus with other diabetic neurological complication: Secondary | ICD-10-CM | POA: Diagnosis not present

## 2015-07-15 DIAGNOSIS — R4182 Altered mental status, unspecified: Secondary | ICD-10-CM | POA: Diagnosis not present

## 2015-07-15 MED ORDER — AMLODIPINE BESYLATE 10 MG PO TABS: 10.0000 mg | ORAL_TABLET | Freq: Every day | ORAL | Status: DC

## 2015-07-15 MED ORDER — HYDRALAZINE HCL 25 MG PO TABS
25.0000 mg | ORAL_TABLET | Freq: Three times a day (TID) | ORAL | Status: DC
  Administered 2015-07-15: 25 mg via ORAL
  Filled 2015-07-15: qty 1

## 2015-07-15 MED ORDER — AMOXICILLIN-POT CLAVULANATE 875-125 MG PO TABS: 1.0000 | ORAL_TABLET | Freq: Two times a day (BID) | ORAL | Status: DC

## 2015-07-15 MED ORDER — LEVETIRACETAM 500 MG PO TABS: 500.0000 mg | ORAL_TABLET | Freq: Two times a day (BID) | ORAL | Status: DC

## 2015-07-15 MED ORDER — METOPROLOL TARTRATE 25 MG PO TABS: 25.0000 mg | ORAL_TABLET | Freq: Two times a day (BID) | ORAL | Status: DC

## 2015-07-15 MED ORDER — TAMSULOSIN HCL 0.4 MG PO CAPS: 0.4000 mg | ORAL_CAPSULE | Freq: Every day | ORAL | Status: DC

## 2015-07-15 MED ORDER — ATORVASTATIN CALCIUM 80 MG PO TABS: 80.0000 mg | ORAL_TABLET | Freq: Every day | ORAL | Status: DC

## 2015-07-15 MED ORDER — HYDRALAZINE HCL 25 MG PO TABS: 25.0000 mg | ORAL_TABLET | Freq: Three times a day (TID) | ORAL | Status: DC

## 2015-07-15 NOTE — Clinical Social Work Note (Signed)
Clinical Social Work Assessment  Patient Details  Name: Mark Soto MRN: [id number] Date of Birth: [birth year]  Date of referral:  07/15/15               Reason for consult:  Facility Placement                Permission sought to share information with:  Facility Sport and exercise psychologist, Family Supports Permission granted to share information::  Yes, Verbal Permission Granted (Pt disoriented. Spoke w/ wife, primary support. )  Name::     Sydell Axon  Agency::  [facility] SNF's  Relationship::  Wife  Contact Information:  [phone]  Housing/Transportation Living arrangements for the past 2 months:  Single Family Home Source of Information:  Spouse Patient Interpreter Needed:  None Criminal Activity/Legal Involvement Pertinent to Current Situation/Hospitalization:  No - Comment as needed Significant Relationships:  Adult Children, Siblings, Spouse Lives with:  Spouse Do you feel safe going back to the place where you live?  No Need for family participation in patient care:  Yes (Comment)  Care giving concerns:  CSW received referral for possible SNF placement at time of discharge. CSW met with patient and patient's wife at bedside regarding PT recommendation of SNF placement at time of discharge. Per patient's wife, patient's wife is unable to care for patient at their home given patient's current physical needs and fall risk. Patient and patient's wife expressed understanding of PT recommendation and are agreeable to SNF placement at time of discharge. CSW to continue to follow and assist with discharge planning needs.   Social Worker assessment / plan:  CSW spoke with patient and patient's wife concerning possibility of rehab at [facility] before returning home.  Employment status:  Retired Nurse, adult PT Recommendations:  [place] / Referral to community resources:  [place]  Patient/Family's Response to care:   Patient and patient's husband recognize need for rehab before returning home and are agreeable to a SNF in [place]. Patient's wife reported her request to have the patient placed into long term care after the SNF stay. CSW provided patient's wife w/ medicaid application.   Patient/Family's Understanding of and Emotional Response to Diagnosis, Current Treatment, and Prognosis:  Patient is realistic regarding therapy needs. No questions/concerns about plan or treatment.    Emotional Assessment Appearance:  Appears stated age Attitude/Demeanor/Rapport:    Affect (typically observed):  Appropriate Orientation:  Oriented to Self, Oriented to Place Alcohol / Substance use:  Not Applicable Psych involvement (Current and /or in the community):  No (Comment)  Discharge Needs  Concerns to be addressed:  Care Coordination Readmission within the last 30 days:  No Current discharge risk:  None Barriers to Discharge:  No Barriers Identified   Benard Halsted, LCSW 07/15/2015, 3:06 PM

## 2015-07-15 NOTE — Care Management Note (Signed)
Case Management Note  Patient Details  Name: Audrey Sudbury MRN: [id number] Date of Birth: 10/23/1940  Subjective/Objective:     Patient is for dc to SNF today,  CSW following.               Action/Plan:   Expected Discharge Date:  07/15/15               Expected Discharge Plan:  Skilled Nursing Facility  In-House Referral:  Clinical Social Work  Discharge planning Services  CM Consult  Post Acute Care Choice:    Choice offered to:     DME Arranged:    DME Agency:     HH Arranged:    [place] Agency:     Status of Service:  Completed, signed off  Medicare Important Message Given:  Yes Date Medicare IM Given:    Medicare IM give by:    Date Additional Medicare IM Given:    Additional Medicare Important Message give by:     If discussed at [place] of Stay Meetings, dates discussed:    Additional Comments:  [name], RN 07/15/2015, 1:31 PM

## 2015-07-15 NOTE — Progress Notes (Signed)
Nutrition Brief Note  Patient identified on the Malnutrition Screening Tool (MST) Report  Wt Readings from Last 15 Encounters:  07/13/15 243 lb (110.224 kg)  01/16/15 240 lb (108.863 kg)  12/10/14 237 lb 9.6 oz (107.775 kg)  09/10/14 236 lb 3.2 oz (107.14 kg)  09/10/14 239 lb (108.41 kg)  09/04/14 243 lb (110.224 kg)  08/28/14 241 lb (109.317 kg)  07/16/14 240 lb 9.6 oz (109.135 kg)  07/04/14 237 lb (107.502 kg)  06/21/14 241 lb (109.317 kg)  06/13/14 238 lb 9.6 oz (108.228 kg)  06/12/14 249 lb (112.946 kg)  04/06/13 238 lb (107.956 kg)  03/22/13 239 lb (108.41 kg)  03/09/13 238 lb 6.4 oz (108.138 kg)   Mark Soto is a 74 y.o. male with h/o seizures since stroke back in 2015. Has had increasing confusion for the past week and suffered 2 back to back seizures at home today. Patient had 3rd seizure in ED and was given 2mg  IV ativan. He is now post-ictal.  Pt admitted with AMS and seizures.   Spoke with pt, who was sitting in recliner at time of visit. He reports he just finished walking with PT prior to RD visit.   He reports good appetite both currently and PTA; he reveals he "ate 99%" of his breakfast this morning. He denies any difficulty chewing or swallowing foods.   He denies any weight loss. Per chart review, UBW 240#. Weight has been stable over the past year.   Nutrition-Focused physical exam completed. Findings are no fat depletion, *no muscle depletion, and no edema.   Discussed importance of continued good meal intake to promote healing. Pt denied further nutritional needs, however, expressed appreciation for RD visit.   Body mass index is 32.95 kg/(m^2). Patient meets criteria for obesity, class I based on current BMI.   Current diet order is Dysphagia 2, patient is consuming approximately 75-100% of meals at this time. Labs and medications reviewed.   No nutrition interventions warranted at this time. If nutrition issues arise, please consult RD.    Mark Soto A. Jimmye Norman, RD, LDN, CDE Pager: [phone] After hours Pager: [phone]

## 2015-07-15 NOTE — Progress Notes (Signed)
Patient will DC to: [facility] Anticipated DC date: 07/15/15 Family notified: Sydell Axon, Wife Transport by: Corey Harold  CSW signing off.  Cedric Fishman, [place] Social Worker [phone]

## 2015-07-15 NOTE — Progress Notes (Signed)
Pt prepared for d/c to SNF. IV d/c'd. Skin intact except as charted in most recent assessments. Vitals are stable. Report called to receiving facility. Pt to be transported by ambulance service. 

## 2015-07-15 NOTE — Evaluation (Signed)
Occupational Therapy Evaluation Patient Details Name: Mark Soto MRN: [id number] DOB: 1940/09/27 Today's Date: 07/15/2015    History of Present Illness Patient is a 74 y/o male presents with seizures and presumed aspiration PNA. PMH includes HTN, seizures, CVA, HLD, tinnitus.   Clinical Impression   Pt is very pleasant and cooperative.  Currently presents at min guard assist level for selfcare tasks in standing at the sink and for LB selfcare sit to stand.  Min guard also needed for transfers in the room and to the bedside chair.  Feel pt will benefit from short term acute care OT to increase independence back to modified independent level.  Recommend SNF for short term follow-up depending on progress.      Follow Up Recommendations  SNF;Supervision/Assistance - 24 hour    Equipment Recommendations  None recommended by OT (TBD)       Precautions / Restrictions Precautions Precautions: Fall Precaution Comments: seizure precautions Restrictions Weight Bearing Restrictions: No      Mobility Bed Mobility Overal bed mobility: Modified Independent Bed Mobility: Supine to Sit     Supine to sit: Modified independent (Device/Increase time);HOB elevated        Transfers Overall transfer level: Needs assistance Equipment used: 1 person hand held assist Transfers: Sit to/from Stand Sit to Stand: Min guard              Balance     Sitting balance-Leahy Scale: Good       Standing balance-Leahy Scale: Fair Standing balance comment: Pt able to stand statically at the sink without UE support for grooming with only min guard assist.                             ADL Overall ADL's : Needs assistance/impaired Eating/Feeding: Independent;Sitting   Grooming: Wash/dry hands;Wash/dry face;Standing;Min guard   Upper Body Bathing: Standing;Min guard   Lower Body Bathing: Min guard;Sit to/from stand   Upper Body Dressing : Supervision/safety;Sitting    Lower Body Dressing: Min guard;Sit to/from stand   Toilet Transfer: Min guard;Ambulation   Toileting- Clothing Manipulation and Hygiene: Min guard;Sit to/from stand       Functional mobility during ADLs: Min guard General ADL Comments: Pt pleasant and cooperative.  Slight decreased speed with mobility but not LOB noted.      Vision Vision Assessment?: No apparent visual deficits   Perception Perception Perception Tested?: No   Praxis Praxis Praxis tested?: Not tested    Pertinent Vitals/Pain Pain Assessment: No/denies pain     Hand Dominance Right   Extremity/Trunk Assessment Upper Extremity Assessment Upper Extremity Assessment: Overall WFL for tasks assessed   Lower Extremity Assessment Lower Extremity Assessment: Defer to PT evaluation   Cervical / Trunk Assessment Cervical / Trunk Assessment: Other exceptions Cervical / Trunk Exceptions: cervical flexion at rest, limitations with cervical extension   Communication Communication Communication: No difficulties   Cognition Arousal/Alertness: Awake/alert Behavior During Therapy: WFL for tasks assessed/performed Overall Cognitive Status: Impaired/Different from baseline Area of Impairment: Memory     Memory: Decreased short-term memory       Problem Solving: Slow processing General Comments: Pt with no memory of seizure event but does understand that he had a seizure at home and then two more at the hospital.               Home Living Family/patient expects to be discharged to:: Private residence   Available Help at Discharge: Available 24 hours/day  Type of Home: House Home Access: Level entry     Home Layout: One level     Bathroom Shower/Tub: Tub/shower unit Shower/tub characteristics: Architectural technologist: Standard     Home Equipment: None          Prior Functioning/Environment Level of Independence: Independent        Comments: Pt not a great historian. Reports being  independent at home. Per wife and notes, she is not able to care for spouse at home anymore due to recent decline.    OT Diagnosis: Generalized weakness;Cognitive deficits   OT Problem List: Decreased strength;Impaired balance (sitting and/or standing);Decreased knowledge of use of DME or AE   OT Treatment/Interventions: Self-care/ADL training;Balance training;Therapeutic activities;DME and/or AE instruction;Patient/family education    OT Goals(Current goals can be found in the care plan section) Acute Rehab OT Goals Patient Stated Goal: To get back home to his wife and son. OT Goal Formulation: With patient Time For Goal Achievement: 07/29/15 Potential to Achieve Goals: Good  OT Frequency: Min 2X/week              End of Session Equipment Utilized During Treatment: Gait belt Nurse Communication: Mobility status  Activity Tolerance: Patient tolerated treatment well Patient left: in chair;with call bell/phone within reach;with chair alarm set;with nursing/sitter in room   Time: [phone] OT Time Calculation (min): 30 min Charges:  OT General Charges $OT Visit: 1 Procedure OT Evaluation $Initial OT Evaluation Tier I: 1 Procedure OT Treatments $Self Care/Home Management : 8-22 mins  Arrie Zuercher OTR/L 07/15/2015, 10:38 AM

## 2015-07-15 NOTE — Progress Notes (Signed)
Spoke with wife.  Wife will take home wallet and other personal belongings.

## 2015-07-15 NOTE — Discharge Summary (Signed)
Physician Discharge Summary   Patient ID: Mark Soto MRN: [id number] DOB/AGE: 73/05/1941 73 y.o.  Admit date: 07/11/2015 Discharge date: 07/15/2015  Primary Care Physician:  Reginia Forts, MD  Discharge Diagnoses:    Acute seizure activity  . pneumonia likely aspiration pneumonia  . High anion gap metabolic acidosis . Altered mental status   Leukocytosis   Accelerated hypertension  Consults:  Neurology  Recommendations for Outpatient Follow-up:  1. PT recommended skilled nursing facility 2. Please repeat CBC/BMET at next visit 3. Patient may need further adjustment in his antihypertensives    DIET: Heart healthy diet    Allergies:  No Known Allergies   DISCHARGE MEDICATIONS: Current Discharge Medication List    START taking these medications   Details  amoxicillin-clavulanate (AUGMENTIN) 875-125 MG tablet Take 1 tablet by mouth 2 (two) times daily. X 7days Qty: 14 tablet, Refills: 0    hydrALAZINE (APRESOLINE) 25 MG tablet Take 1 tablet (25 mg total) by mouth 3 (three) times daily. Qty: 90 tablet, Refills: 3    metoprolol tartrate (LOPRESSOR) 25 MG tablet Take 1 tablet (25 mg total) by mouth 2 (two) times daily. Qty: 60 tablet, Refills: 3      CONTINUE these medications which have CHANGED   Details  amLODipine (NORVASC) 10 MG tablet Take 1 tablet (10 mg total) by mouth daily. Qty: 30 tablet, Refills: 3    atorvastatin (LIPITOR) 80 MG tablet Take 1 tablet (80 mg total) by mouth daily at 6 PM. Qty: 30 tablet, Refills: 5   Associated Diagnoses: Cerebral embolism with cerebral infarction (HCC)    levETIRAcetam (KEPPRA) 500 MG tablet Take 1 tablet (500 mg total) by mouth 2 (two) times daily. Qty: 60 tablet, Refills: 5   Associated Diagnoses: Seizure ([place])    tamsulosin (FLOMAX) 0.4 MG CAPS capsule Take 1 capsule (0.4 mg total) by mouth daily after breakfast. Qty: 30 capsule, Refills: 4      CONTINUE these medications which have NOT CHANGED   Details   aspirin 325 MG EC tablet Take 1 tablet (325 mg total) by mouth daily. Qty: 30 tablet, Refills: 0    ferrous sulfate 325 (65 FE) MG tablet Take 1 tablet (325 mg total) by mouth daily with breakfast. Qty: 90 tablet, Refills: 1    metFORMIN (GLUCOPHAGE) 500 MG tablet Take 1 tablet (500 mg total) by mouth daily with breakfast. Qty: 90 tablet, Refills: 1      STOP taking these medications     donepezil (ARICEPT) 10 MG tablet      Fluoxetine HCl, PMDD, 20 MG CAPS          Brief H and P: For complete details please refer to admission H and P, but in briefClarence Soto is a 74 y.o. male with h/o seizures since CVA in 2015. Has had increasing confusion for the past week prior to admission and suffered 2 back to back seizures at home on the day of admission. Patient had 3rd seizure in ED and was given 2mg  IV ativan.Work up in the ED Chest x-ray with bibasilar infiltrates, WBC of 21k with a mild left shift as well bicarbonate 10. Patient was admitted for further workup.  Hospital Course:  Seizure activity with post ictal state: Likely due to noncompliance to treatment - Neurology was consulted and patient was initially started on IV Keppra, subsequently transitioned to oral Keppra once tolerating diet. - No further seizures since admission, mental status back to baseline - PT recommends skilled nursing facility - CT head showed  generalized atrophy and moderately severe chronic white matter changes likely due to small vessel disease. No acute infarct. - EEG showed occasional left temporal sharp wave activity indicating a potential seizure foci.  Aspiration PNA: related to seizure most likely -Zosyn discontinued, blood cultures negative so far, transitioned to oral Augmentin.   High anion gap metabolic acidosis: Due to lactic acidosis from multiple seizures  -Lactic acid improved, patient was placed on gentle hydration. Gap now closed.  Leukocytosis: demargination and infection,  resolved -will continue antibiotics  Accelerated hypertension -BP is better but not controlled yet, increased hydralazine. Continue Norvasc and metoprolol.   Hyperlipidemia  will continue statins    Day of Discharge BP 158/71 mmHg  Pulse 73  Temp(Src) 99.2 F (37.3 C) (Oral)  Resp 18  Ht 6' (1.829 m)  Wt 110.224 kg (243 lb)  BMI 32.95 kg/m2  SpO2 97%  Physical Exam: General: Alert and awake oriented x3 not in any acute distress. HEENT: anicteric sclera, pupils reactive to light and accommodation CVS: S1-S2 clear no murmur rubs or gallops Chest: clear to auscultation bilaterally, no wheezing rales or rhonchi Abdomen: soft nontender, nondistended, normal bowel sounds Extremities: no cyanosis, clubbing or edema noted bilaterally Neuro: Cranial nerves II-XII intact, no focal neurological deficits   The results of significant diagnostics from this hospitalization (including imaging, microbiology, ancillary and laboratory) are listed below for reference.    LAB RESULTS: Basic Metabolic Panel:  Recent Labs Lab 07/13/15 0512 07/14/15 0554  NA 142 141  K 3.7 3.9  CL 112* 111  CO2 23 23  GLUCOSE 111* 114*  BUN 14 8  CREATININE 1.17 1.09  CALCIUM 8.3* 8.4*  MG  --  1.8   Liver Function Tests:  Recent Labs Lab 07/11/15 0126 07/14/15 0554  AST 29 113*  ALT 12* 31  ALKPHOS 86 77  BILITOT 0.8 1.5*  PROT 7.2 6.1*  ALBUMIN 4.0 2.9*   No results for input(s): LIPASE, AMYLASE in the last 168 hours. No results for input(s): AMMONIA in the last 168 hours. CBC:  Recent Labs Lab 07/11/15 0126  07/13/15 0512 07/14/15 0554  WBC 21.0*  --  7.4 8.2  NEUTROABS 16.1*  --   --   --   HGB 12.2*  < > 10.7* 11.4*  HCT 38.8*  < > 33.1* 35.1*  MCV 77.0*  --  73.4* 73.4*  PLT 226  --  170 185  < > = values in this interval not displayed. Cardiac Enzymes: No results for input(s): CKTOTAL, CKMB, CKMBINDEX, TROPONINI in the last 168 hours. BNP: Invalid input(s):  POCBNP CBG:  Recent Labs Lab 07/11/15 1212  GLUCAP 78    Significant Diagnostic Studies:  Ct Head Wo Contrast  07/11/2015  CLINICAL DATA:  Witnessed seizure. Last seen normal a week ago. Baseline confusion has worsened. EXAM: CT HEAD WITHOUT CONTRAST TECHNIQUE: Contiguous axial images were obtained from the base of the skull through the vertex without intravenous contrast. COMPARISON:  06/13/2014 FINDINGS: There is no intracranial hemorrhage, mass or evidence of acute infarction. There is no extra-axial fluid collection. There is moderately severe generalized atrophy. Moderately severe periventricular white matter hypodensity is likely due to chronic small vessel ischemic disease. There is remote lacunar infarction in the right thalamus. There is remote left occipital infarction. No acute intracranial findings are evident. No bone abnormalities evident. The visible paranasal sinuses are clear. IMPRESSION: Generalized atrophy and moderately severe chronic white matter changes likely due to small vessel disease. Remote infarctions. No acute  intracranial findings. Electronically Signed   By: Andreas Newport M.D.   On: 07/11/2015 01:52   Dg Chest Portable 1 View  07/11/2015  CLINICAL DATA:  Seizure.  Dyspnea. EXAM: PORTABLE CHEST 1 VIEW COMPARISON:  01/29/2013 FINDINGS: Basilar opacities are present bilaterally due to atelectasis or infectious infiltrate. No large effusions. No pneumothorax. IMPRESSION: Bibasilar atelectasis or infectious infiltrate. Aspiration not excluded. Electronically Signed   By: Andreas Newport M.D.   On: 07/11/2015 02:54       Disposition and Follow-up: Discharge Instructions    Diet Carb Modified    Complete by:  As directed      Increase activity slowly    Complete by:  As directed             DISPOSITION: Skilled nursing facility   DISCHARGE FOLLOW-UP Follow-up Information    Follow up with SMITH,KRISTI, MD. Schedule an appointment as soon as possible  for a visit in 2 weeks.   Specialty:  Family Medicine   Why:  for hospital follow-up   Contact information:   [place] [id number] [phone]        Time spent on Discharge: 35 minutes  Signed:   Reason Helzer M.D. [facility] 07/15/2015, 12:01 PM Pager: [phone]

## 2015-07-15 NOTE — Clinical Social Work Placement (Signed)
   CLINICAL SOCIAL WORK PLACEMENT  NOTE  Date:  07/15/2015  Patient Details  Name: Mark Soto MRN: [id number] Date of Birth: 10/08/1940  Clinical Social Work is seeking post-discharge placement for this patient at the [place] level of care (*CSW will initial, date and re-position this form in  chart as items are completed):  Yes   Patient/family provided with [facility] Work Department's list of facilities offering this level of care within the geographic area requested by the patient (or if unable, by the patient's family).  Yes   Patient/family informed of their freedom to choose among providers that offer the needed level of care, that participate in Medicare, Medicaid or managed care program needed by the patient, have an available bed and are willing to accept the patient.  Yes   Patient/family informed of [facility]'s ownership interest in [facility] and [facility], as well as of the fact that they are under no obligation to receive care at these facilities.  PASRR submitted to EDS on       PASRR number received on       Existing PASRR number confirmed on 07/15/15     FL2 transmitted to all facilities in geographic area requested by pt/family on 07/15/15     FL2 transmitted to all facilities within larger geographic area on       Patient informed that his/her managed care company has contracts with or will negotiate with certain facilities, including the following:        Yes   Patient/family informed of bed offers received.  Patient chooses bed at [facility]     Physician recommends and patient chooses bed at      Patient to be transferred to [facility] on 07/15/15.  Patient to be transferred to facility by [facility]     Patient family notified on 07/15/15 of transfer.  Name of family member notified:  Leonard Downing [phone]     PHYSICIAN       Additional Comment:     _______________________________________________ Benard Halsted, LCSW 07/15/2015, 4:22 PM

## 2015-07-15 NOTE — NC FL2 (Signed)
[place] MEDICAID FL2 LEVEL OF CARE SCREENING TOOL     IDENTIFICATION  Patient Name: Mark Soto Birthdate: [birth year] Sex: male Admission Date (Current Location): 07/11/2015  [facility] and [place] Number: Herbalist and Address:  [facility] [place] [place] [address], [place], [place] [address]      Provider Number: [id number]  Attending Physician Name and Address:  Mendel Corning, MD  Relative Name and Phone Number:  Leonard Downing, [phone]    Current Level of Care: Hospital Recommended Level of Care: [place] Prior Approval Number:    Date Approved/Denied:   PASRR Number: [id number]  Discharge Plan: SNF    Current Diagnoses: Patient Active Problem List   Diagnosis Date Noted  . Benign essential HTN   . CAP (community acquired pneumonia) 07/11/2015  . High anion gap metabolic acidosis [id number]  . Aspiration pneumonia ([place])   . Type 2 diabetes mellitus without complication ([place]) 99991111  . History of CVA (cerebrovascular accident) 09/11/2014  . Obesity (BMI 30.0-34.9) 09/11/2014  . Seizures ([place]) 09/04/2014  . Syphilis, latent 09/04/2014  . Essential hypertension 09/04/2014  . Glucose intolerance (impaired glucose tolerance) 09/04/2014  . Vascular dementia 09/04/2014  . Essential hypertension, benign 06/25/2014  . BPH (benign prostatic hyperplasia) 06/25/2014  . Late latent syphilis   . Syphilis in male 06/15/2014  . HLD (hyperlipidemia)   . PFO (patent foramen ovale)   . Cerebral embolism with cerebral infarction ([place]) 06/14/2014  . Stroke ([place])   . Seizure ([place]) 06/13/2014  . Altered mental status   . Dementia   . History of colonic polyps 04/14/2013    Orientation ACTIVITIES/SOCIAL BLADDER RESPIRATION    Self, Place  Family supportive Incontinent, External catheter (Condom catheter) Normal  BEHAVIORAL SYMPTOMS/MOOD NEUROLOGICAL BOWEL NUTRITION STATUS      Continent  (See DC Summary)  PHYSICIAN  VISITS COMMUNICATION OF NEEDS Height & Weight Skin    Verbally 6' (182.9 cm) 243 lbs. Normal          AMBULATORY STATUS RESPIRATION    Supervision limited Normal      Personal Care Assistance Level of Assistance  Bathing, Feeding, Dressing Bathing Assistance: Limited assistance Feeding assistance: Limited assistance Dressing Assistance: Limited assistance      Functional Limitations [place]  PT (By licensed PT)     PT Frequency: 5x/week             Additional Factors Info  Code Status, Allergies Code Status Info: Full Allergies Info: NKA           Current Medications (07/15/2015):  This is the current hospital active medication list Current Facility-Administered Medications  Medication Dose Route Frequency Provider Last Rate Last Dose  . amLODipine (NORVASC) tablet 10 mg  10 mg Oral Daily Ripudeep Krystal Eaton, MD   10 mg at 07/15/15 0815  . amoxicillin-clavulanate (AUGMENTIN) 875-125 MG per tablet 1 tablet  1 tablet Oral Q12H Ripudeep Krystal Eaton, MD   1 tablet at 07/15/15 0815  . aspirin EC tablet 325 mg  325 mg Oral Daily Etta Quill, DO   325 mg at 07/15/15 [id number]  . atorvastatin (LIPITOR) tablet 80 mg  80 mg Oral q1800 Etta Quill, DO   80 mg at 07/14/15 1747  . dextromethorphan-guaiFENesin (MUCINEX DM) 30-600 MG per 12 hr tablet 1 tablet  1 tablet Oral BID Barton Dubois, MD  1 tablet at 07/15/15 0815  . ferrous sulfate tablet 325 mg  325 mg Oral Q breakfast Etta Quill, DO   325 mg at 07/15/15 [id number]  . heparin injection 5,000 Units  5,000 Units Subcutaneous 3 times per day Etta Quill, DO   5,000 Units at 07/15/15 [id number]  . hydrALAZINE (APRESOLINE) injection 20 mg  20 mg Intravenous Q6H PRN Dianne Dun, NP   20 mg at 07/14/15 1751  . hydrALAZINE (APRESOLINE) tablet 25 mg  25 mg Oral 3 times per day Ripudeep Krystal Eaton, MD      . levETIRAcetam (KEPPRA) tablet 500 mg  500 mg Oral BID Barton Dubois, MD   500 mg  at 07/15/15 0815  . metoprolol tartrate (LOPRESSOR) tablet 25 mg  25 mg Oral BID Ripudeep Krystal Eaton, MD   25 mg at 07/15/15 [id number]     Discharge Medications: Please see discharge summary for a list of discharge medications.  Relevant Imaging Results:  Relevant Lab Results:  Recent Labs    Additional [place], LCSW

## 2015-07-16 ENCOUNTER — Telehealth: Payer: Self-pay | Admitting: Family Medicine

## 2015-07-16 LAB — CULTURE, BLOOD (ROUTINE X 2)
CULTURE: NO GROWTH
Culture: NO GROWTH

## 2015-07-16 NOTE — Telephone Encounter (Signed)
SPOKE WITH PATIENTS DAUGHTER AND PATIENT IS IN THE HOSPITAL.  HE HAD A STROKE AND HEART ATTACK.  WILL BE IN REHAB FOR AWHILE.

## 2015-07-19 ENCOUNTER — Non-Acute Institutional Stay (SKILLED_NURSING_FACILITY): Payer: Medicare Other | Admitting: Adult Health

## 2015-07-19 DIAGNOSIS — E1169 Type 2 diabetes mellitus with other specified complication: Secondary | ICD-10-CM

## 2015-07-19 DIAGNOSIS — J690 Pneumonitis due to inhalation of food and vomit: Secondary | ICD-10-CM

## 2015-07-19 DIAGNOSIS — N400 Enlarged prostate without lower urinary tract symptoms: Secondary | ICD-10-CM | POA: Diagnosis not present

## 2015-07-19 DIAGNOSIS — E785 Hyperlipidemia, unspecified: Secondary | ICD-10-CM | POA: Diagnosis not present

## 2015-07-19 DIAGNOSIS — R569 Unspecified convulsions: Secondary | ICD-10-CM | POA: Diagnosis not present

## 2015-07-19 DIAGNOSIS — I10 Essential (primary) hypertension: Secondary | ICD-10-CM | POA: Diagnosis not present

## 2015-07-19 DIAGNOSIS — E1149 Type 2 diabetes mellitus with other diabetic neurological complication: Secondary | ICD-10-CM | POA: Diagnosis not present

## 2015-07-22 ENCOUNTER — Encounter: Payer: Self-pay | Admitting: Adult Health

## 2015-07-22 DIAGNOSIS — E1149 Type 2 diabetes mellitus with other diabetic neurological complication: Secondary | ICD-10-CM | POA: Insufficient documentation

## 2015-07-22 DIAGNOSIS — E1169 Type 2 diabetes mellitus with other specified complication: Secondary | ICD-10-CM | POA: Insufficient documentation

## 2015-07-22 DIAGNOSIS — E785 Hyperlipidemia, unspecified: Secondary | ICD-10-CM

## 2015-07-22 NOTE — Progress Notes (Signed)
Patient ID: Mark Soto, male   DOB: 1941-04-02, 74 y.o.   MRN: [id number]   Facility: [facility]      No Known Allergies  Chief Complaint  Patient presents with  . Hospitalization Follow-up    HPI:  He has been hospitalized for seizure activity and aspiration pneumonia with metabolic acidosis. He is here for short term rehab. He is not voicing any complaints stating that he is feeling good. There are no nursing concerns at this time.    Past Medical History  Diagnosis Date  . Hypertension   . Tinnitus     Right Ear  . Anemia   . Abdominal wall hernia   . Personal history of colonic adenomas 04/14/2013  . Acute left PCA stroke ([place]) 06/13/2014  . Seizures ([place]) 06/13/2014  . 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  . Hyperlipidemia   . BPH (benign prostatic hyperplasia)   . Glucose intolerance (impaired glucose tolerance)     Past Surgical History  Procedure Laterality Date  . None    . Tee without cardioversion N/A 06/15/2014    Procedure: TRANSESOPHAGEAL ECHOCARDIOGRAM (TEE);  Surgeon: Fay Records, MD;  Location: [facility] ENDOSCOPY;  Service: Cardiovascular;  Laterality: N/A;  . Loop recorder implant N/A 06/19/2014    Procedure: LOOP RECORDER IMPLANT;  Surgeon: Evans Lance, MD;  Location: [facility] CATH LAB;  Service: Cardiovascular;  Laterality: N/A;    VITAL SIGNS BP 139/67 mmHg  Pulse 79  Ht 6' (1.829 m)  Wt 234 lb (106.142 kg)  BMI 31.73 kg/m2  SpO2 98%  Patient's Medications  New Prescriptions   No medications on file  Previous Medications   AMLODIPINE (NORVASC) 10 MG TABLET    Take 1 tablet (10 mg total) by mouth daily.   AMOXICILLIN-CLAVULANATE (AUGMENTIN) 875-125 MG TABLET    Take 1 tablet by mouth 2 (two) times daily. X 7days   ASPIRIN 325 MG EC TABLET    Take 1 tablet (325 mg total) by mouth daily.   ATORVASTATIN (LIPITOR) 80 MG TABLET    Take 1 tablet (80 mg total) by mouth daily at 6 PM.   FERROUS SULFATE 325 (65 FE) MG TABLET    Take 1  tablet (325 mg total) by mouth daily with breakfast.   HYDRALAZINE (APRESOLINE) 25 MG TABLET    Take 1 tablet (25 mg total) by mouth 3 (three) times daily.   LEVETIRACETAM (KEPPRA) 500 MG TABLET    Take 1 tablet (500 mg total) by mouth 2 (two) times daily.   METFORMIN (GLUCOPHAGE) 500 MG TABLET    Take 1 tablet (500 mg total) by mouth daily with breakfast.   METOPROLOL TARTRATE (LOPRESSOR) 25 MG TABLET    Take 1 tablet (25 mg total) by mouth 2 (two) times daily.   TAMSULOSIN (FLOMAX) 0.4 MG CAPS CAPSULE    Take 1 capsule (0.4 mg total) by mouth daily after breakfast.  Modified Medications   No medications on file  Discontinued Medications   No medications on file     SIGNIFICANT DIAGNOSTIC EXAMS  07-11-15 chest x-ray: Bibasilar atelectasis or infectious infiltrate. Aspiration not excluded  07-11-15: ct of head: Generalized atrophy and moderately severe chronic white matter changes likely due to small vessel disease. Remote infarctions. No acute intracranial findings.  07-11-15: EEG: Abnormal EEG due to the presence of occasional left temporal sharp wave activity indicating a potential seizure foci.     LABS REVIEWED:   07-11-15: wbc 21.0; hgb  12.2; hct 38.8; mcv 77.0; plt 226; glucose 218; bun 18; creat 1.38; k+ 4.1; na++138; liver normal albumin 4.0; lactic acid 4.15; HIV: nr; blood culture: no growth 07-13-15: wbc 7.4; hgb 10.7; hct 33.1; mcv 73.4; plt 170; glucose 111; bun 14; creat 1.17; k+ 3.7; na++142; lactic acid 0.6 07-14-15; wbc 8.2; hgb 11.4; hct 35.1; mcv 73.4; plt 185; glucose 114; bun 8; creat 1.09; k+ 3.9; na++139; ast 13; alt 31; alk phos 77; t bili 1.5; albumin 2.9      Review of Systems  Constitutional: Negative for malaise/fatigue.  Respiratory: Negative for cough and shortness of breath.   Cardiovascular: Negative for chest pain, palpitations and leg swelling.  Gastrointestinal: Negative for heartburn, abdominal pain and constipation.  Musculoskeletal: Negative for  myalgias and joint pain.  Skin: Negative.   Neurological: Negative for dizziness and headaches.  Psychiatric/Behavioral: The patient is not nervous/anxious.      Physical Exam  Constitutional: He is oriented to person, place, and time. No distress.  Eyes: Conjunctivae are normal.  Neck: Neck supple. No JVD present. No thyromegaly present.  Cardiovascular: Normal rate, regular rhythm and intact distal pulses.   Respiratory: Effort normal and breath sounds normal. No respiratory distress. He has no wheezes.  GI: Soft. Bowel sounds are normal. He exhibits no distension. There is no tenderness.  Musculoskeletal: He exhibits no edema.  Able to move all extremities   Lymphadenopathy:    He has no cervical adenopathy.  Neurological: He is alert and oriented to person, place, and time.  Skin: Skin is warm and dry. He is not diaphoretic.  Psychiatric: He has a normal mood and affect.        ASSESSMENT/ PLAN:  1. Seizure: no reports of seizure activity present; will continue keppra 500 mg twice daily   2. Dyslipidemia: will continue lipitor 80 mg daily  3. Hypertension: will asa 325 mg daily; apresoline 25 mg three times daily; lopressor 25 mg twice daily will monitor  4. Diabetes: will continue metformin 500 mg daily   5. BPH: will continue flomax 0.4 mg daily   6. Anemia: will continue iron daily   7. Pneumonia; aspiration: will complete augmentin and will monitor his status.   8. History of CVA: is neurologically stable will continue asa 325 mg daily    Will check cbc; bmp   Time spent with patient  50  minutes >50% time spent counseling; reviewing medical record; tests; labs; and developing future plan of care    Ok Edwards NP [facility]  Contact [phone] Monday through Friday 8am- 5pm  After hours call [phone]

## 2015-07-23 ENCOUNTER — Non-Acute Institutional Stay (SKILLED_NURSING_FACILITY): Payer: Medicare Other | Admitting: Internal Medicine

## 2015-07-23 ENCOUNTER — Encounter: Payer: Self-pay | Admitting: Internal Medicine

## 2015-07-23 DIAGNOSIS — N400 Enlarged prostate without lower urinary tract symptoms: Secondary | ICD-10-CM | POA: Diagnosis not present

## 2015-07-23 DIAGNOSIS — R569 Unspecified convulsions: Secondary | ICD-10-CM | POA: Diagnosis not present

## 2015-07-23 DIAGNOSIS — F0150 Vascular dementia without behavioral disturbance: Secondary | ICD-10-CM

## 2015-07-23 DIAGNOSIS — E1149 Type 2 diabetes mellitus with other diabetic neurological complication: Secondary | ICD-10-CM

## 2015-07-23 DIAGNOSIS — J690 Pneumonitis due to inhalation of food and vomit: Secondary | ICD-10-CM | POA: Diagnosis not present

## 2015-07-23 DIAGNOSIS — I10 Essential (primary) hypertension: Secondary | ICD-10-CM | POA: Diagnosis not present

## 2015-07-23 DIAGNOSIS — E785 Hyperlipidemia, unspecified: Secondary | ICD-10-CM

## 2015-07-23 DIAGNOSIS — Z8673 Personal history of transient ischemic attack (TIA), and cerebral infarction without residual deficits: Secondary | ICD-10-CM

## 2015-07-23 NOTE — Progress Notes (Signed)
Patient ID: Mark Soto, male   DOB: 10/30/1940, 74 y.o.   MRN: [id number]    HISTORY AND PHYSICAL   DATE: 07/23/15  Location:  [facility]    Place of Service: SNF [phone])   Extended Emergency Contact Information Primary Emergency Contact: [place] Soto Address: [address]          [place], [place] [address] Mark Soto Phone: [phone] Relation: Spouse Secondary Emergency Contact: Soto,Mark Address: [address] [place], [place] [address] [place] Soto of [place] Phone: [phone] Mobile Phone: [phone] Relation: Daughter  Advanced Directive information  [place]  Chief Complaint  Patient presents with  . New Admit To SNF    HPI:  74 yo male seen today as a new admission into SNF following hospital stay for pneumonia likely due to aspiration after seizure, HAGMA, altered MS and accelerated HTN. He was tx with zosyn--> augmentin for bibasilar infiltrates on CXR. Seizure med changed to keppra due to noncompliance of med. He presents to SNF for short term rehab  He has no c/o today. No nursing issues. No falls. He is a poor historian due to dementia. Hx obtained from chart. Appetite ok and sleeping well.  Seizure - no reports of seizure activity present. Takes keppra 500 mg twice daily   Dyslipidemia - stable on lipitor 80 mg daily  Hypertension - BP stable. Takes  asa 325 mg daily; apresoline 25 mg three times daily; lopressor 25 mg twice daily  DM - stable on metformin 500 mg daily. No low BS reactions  BPH - sx's stable on flomax 0.4 mg daily   Hx Anemia - stable on iron daily   History of CVA - neurologically stable. Takes asa 325 mg daily     Past Medical History  Diagnosis Date  . Hypertension   . Tinnitus     Right Ear  . Anemia   . Abdominal wall hernia   . Personal history of colonic adenomas 04/14/2013  . Acute left PCA stroke ([place]) 06/13/2014  . Seizures ([place]) 06/13/2014  .  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  . Hyperlipidemia   . BPH (benign prostatic hyperplasia)   . Glucose intolerance (impaired glucose tolerance)     Past Surgical History  Procedure Laterality Date  . None    . Tee without cardioversion N/A 06/15/2014    Procedure: TRANSESOPHAGEAL ECHOCARDIOGRAM (TEE);  Surgeon: Fay Records, MD;  Location: [facility] ENDOSCOPY;  Service: Cardiovascular;  Laterality: N/A;  . Loop recorder implant N/A 06/19/2014    Procedure: LOOP RECORDER IMPLANT;  Surgeon: Evans Lance, MD;  Location: [facility] CATH LAB;  Service: Cardiovascular;  Laterality: N/A;    Patient Care Team: [place] Honour, MD as PCP - General (Family Medicine)  Social History   Social History  . Marital Status: Married    Spouse Name: N/A  . Number of Children: N/A  . Years of Education: N/A   Occupational History  . Retired    Social History Main Topics  . Smoking status: Never Smoker   . Smokeless tobacco: Never Used  . Alcohol Use: No  . Drug Use: No  . Sexual Activity: Not on file   Other Topics Concern  . Not on file   Social History Narrative   Occasionally drinks coffee      Marital status:  Married x 43 years; first wife.  Children:   3 children; 5 grandchildren; 3 gg.      Lives: Lives with wife at home and 53 year old son. Son has special needs, sleep apnea, & severe seizure disorder. Wife has "many healthy problems". Another daughter in [place]. Daughter in [place].       Employment:  Retired at age 33.      Tobacco; none      Alcohol: rarely      Exercise:  none      3 sisters live in [place]. 1 brother in [place].       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 He has never used smokeless tobacco. He reports that he does not drink alcohol or use illicit drugs.  Family History  Problem Relation Age of Onset  . Breast cancer Mother   . COPD Father   . Hypertension  Sister   . Hypertension Brother   . Hypertension Sister   . Liver cancer Mother   . Lung cancer Mother   . Colon cancer Neg Hx   . Colon polyps Neg Hx   . Stomach cancer Neg Hx    Family Status  Relation Status Death Age  . Mother Deceased   . Father Deceased   . Sister Alive   . Brother Alive   . Sister Alive   . Sister Alive     Immunization History  Administered Date(s) Administered  . Influenza-Unspecified 06/10/2014  . Pneumococcal Polysaccharide-23 06/18/2014    No Known Allergies  Medications: Patient's Medications  New Prescriptions   No medications on file  Previous Medications   AMLODIPINE (NORVASC) 10 MG TABLET    Take 1 tablet (10 mg total) by mouth daily.   AMOXICILLIN-CLAVULANATE (AUGMENTIN) 875-125 MG TABLET    Take 1 tablet by mouth 2 (two) times daily. X 7days   ASPIRIN 325 MG EC TABLET    Take 1 tablet (325 mg total) by mouth daily.   ATORVASTATIN (LIPITOR) 80 MG TABLET    Take 1 tablet (80 mg total) by mouth daily at 6 PM.   FERROUS SULFATE 325 (65 FE) MG TABLET    Take 1 tablet (325 mg total) by mouth daily with breakfast.   HYDRALAZINE (APRESOLINE) 25 MG TABLET    Take 1 tablet (25 mg total) by mouth 3 (three) times daily.   LEVETIRACETAM (KEPPRA) 500 MG TABLET    Take 1 tablet (500 mg total) by mouth 2 (two) times daily.   METFORMIN (GLUCOPHAGE) 500 MG TABLET    Take 1 tablet (500 mg total) by mouth daily with breakfast.   METOPROLOL TARTRATE (LOPRESSOR) 25 MG TABLET    Take 1 tablet (25 mg total) by mouth 2 (two) times daily.   TAMSULOSIN (FLOMAX) 0.4 MG CAPS CAPSULE    Take 1 capsule (0.4 mg total) by mouth daily after breakfast.  Modified Medications   No medications on file  Discontinued Medications   No medications on file    Review of Systems  Unable to perform ROS: Other  short term memory loss  Filed Vitals:   07/23/15 1247  BP: 119/64  Pulse: 60  Temp: 97.5 F (36.4 C)  Weight: 234 lb (106.142 kg)  SpO2: 98%   Body mass index  is 31.73 kg/(m^2).  Physical Exam  Constitutional: He appears well-developed and well-nourished.  HENT:  Mouth/Throat: Oropharynx is clear and moist.  Eyes:  Pupils are equal, round, and reactive to light. No scleral icterus.  Neck: Neck supple. Carotid bruit is not present. No thyromegaly present.  Cardiovascular: Normal rate, regular rhythm, normal heart sounds and intact distal pulses.  Exam reveals no gallop and no friction rub.   No murmur heard. no distal LE swelling. No calf TTP  Pulmonary/Chest: Effort normal. He has no wheezes. He has rales (R>L). He exhibits no tenderness.  Abdominal: Soft. Bowel sounds are normal. He exhibits no distension, no abdominal bruit, no pulsatile midline mass and no mass. There is no tenderness. There is no rebound and no guarding.  Lymphadenopathy:    He has no cervical adenopathy.  Neurological: He is alert.  Left weakness  Skin: Skin is warm and dry. No rash noted.  Psychiatric: He has a normal mood and affect. His behavior is normal. Thought content normal.     Labs reviewed: Admission on 07/11/2015, Discharged on 07/15/2015  Component Date Value Ref Range Status  . Sodium 07/11/2015 139  135 - 145 mmol/L Final  . Potassium 07/11/2015 3.8  3.5 - 5.1 mmol/L Final  . Chloride 07/11/2015 107  101 - 111 mmol/L Final  . BUN 07/11/2015 22* 6 - 20 mg/dL Final  . Creatinine, Ser 07/11/2015 1.10  0.61 - 1.24 mg/dL Final  . Glucose, Bld 07/11/2015 212* 65 - 99 mg/dL Final  . Calcium, Ion 07/11/2015 1.04* 1.13 - 1.30 mmol/L Final  . TCO2 07/11/2015 10  0 - 100 mmol/L Final  . Hemoglobin 07/11/2015 13.6  13.0 - 17.0 g/dL Final  . HCT 07/11/2015 40.0  39.0 - 52.0 % Final  . Alcohol, Ethyl (B) 07/11/2015 <5  <5 mg/dL Final   Comment:        LOWEST DETECTABLE LIMIT FOR SERUM ALCOHOL IS 5 mg/dL FOR MEDICAL PURPOSES ONLY   . Prothrombin Time 07/11/2015 15.2  11.6 - 15.2 seconds Final  . INR 07/11/2015 1.18  0.00 - 1.49 Final  . aPTT 07/11/2015 33  24  - 37 seconds Final  . WBC 07/11/2015 21.0* 4.0 - 10.5 K/uL Final   Comment: REPEATED TO VERIFY WHITE COUNT CONFIRMED ON SMEAR   . RBC 07/11/2015 5.04  4.22 - 5.81 MIL/uL Final  . Hemoglobin 07/11/2015 12.2* 13.0 - 17.0 g/dL Final  . HCT 07/11/2015 38.8* 39.0 - 52.0 % Final  . MCV 07/11/2015 77.0* 78.0 - 100.0 fL Final  . MCH 07/11/2015 24.2* 26.0 - 34.0 pg Final  . MCHC 07/11/2015 31.4  30.0 - 36.0 g/dL Final  . RDW 07/11/2015 16.5* 11.5 - 15.5 % Final  . Platelets 07/11/2015 226  150 - 400 K/uL Final  . Neutrophils Relative % 07/11/2015 77   Final  . Lymphocytes Relative 07/11/2015 18   Final  . Monocytes Relative 07/11/2015 5   Final  . Eosinophils Relative 07/11/2015 0   Final  . Basophils Relative 07/11/2015 0   Final  . Neutro Abs 07/11/2015 16.1* 1.7 - 7.7 K/uL Final  . Lymphs Abs 07/11/2015 3.8  0.7 - 4.0 K/uL Final  . Monocytes Absolute 07/11/2015 1.1* 0.1 - 1.0 K/uL Final  . Eosinophils Absolute 07/11/2015 0.0  0.0 - 0.7 K/uL Final  . Basophils Absolute 07/11/2015 0.0  0.0 - 0.1 K/uL Final  . RBC Morphology 07/11/2015 TEARDROP CELLS   Final   ELLIPTOCYTES  . WBC Morphology 07/11/2015 MILD LEFT SHIFT (1-5% METAS, OCC MYELO, OCC BANDS)   Final  . Sodium 07/11/2015 138  135 - 145 mmol/L Final  . Potassium  07/11/2015 4.1  3.5 - 5.1 mmol/L Final  . Chloride 07/11/2015 103  101 - 111 mmol/L Final  . CO2 07/11/2015 10* 22 - 32 mmol/L Final  . Glucose, Bld 07/11/2015 218* 65 - 99 mg/dL Final  . BUN 07/11/2015 18  6 - 20 mg/dL Final  . Creatinine, Ser 07/11/2015 1.38* 0.61 - 1.24 mg/dL Final  . Calcium 07/11/2015 9.0  8.9 - 10.3 mg/dL Final  . Total Protein 07/11/2015 7.2  6.5 - 8.1 g/dL Final  . Albumin 07/11/2015 4.0  3.5 - 5.0 g/dL Final  . AST 07/11/2015 29  15 - 41 U/L Final  . ALT 07/11/2015 12* 17 - 63 U/L Final  . Alkaline Phosphatase 07/11/2015 86  38 - 126 U/L Final  . Total Bilirubin 07/11/2015 0.8  0.3 - 1.2 mg/dL Final  . GFR calc non Af Amer 07/11/2015 49* >60  mL/min Final  . GFR calc Af Amer 07/11/2015 57* >60 mL/min Final   Comment: (NOTE) The eGFR has been calculated using the CKD EPI equation. This calculation has not been validated in all clinical situations. eGFR's persistently <60 mL/min signify possible Chronic Kidney Disease.   . Anion gap 07/11/2015 25* 5 - 15 Final   RESULT CHECKED  . Troponin i, poc 07/11/2015 0.00  0.00 - 0.08 ng/mL Final  . Comment 3 07/11/2015          Final   Comment: Due to the release kinetics of cTnI, a negative result within the first hours of the onset of symptoms does not rule out myocardial infarction with certainty. If myocardial infarction is still suspected, repeat the test at appropriate intervals.   . Opiates 07/11/2015 NONE DETECTED  NONE DETECTED Final  . Cocaine 07/11/2015 NONE DETECTED  NONE DETECTED Final  . Benzodiazepines 07/11/2015 NONE DETECTED  NONE DETECTED Final  . Amphetamines 07/11/2015 NONE DETECTED  NONE DETECTED Final  . Tetrahydrocannabinol 07/11/2015 NONE DETECTED  NONE DETECTED Final  . Barbiturates 07/11/2015 NONE DETECTED  NONE DETECTED Final   Comment:        DRUG SCREEN FOR MEDICAL PURPOSES ONLY.  IF CONFIRMATION IS NEEDED FOR ANY PURPOSE, NOTIFY LAB WITHIN 5 DAYS.        LOWEST DETECTABLE LIMITS FOR URINE DRUG SCREEN Drug Class       Cutoff (ng/mL) Amphetamine      1000 Barbiturate      200 Benzodiazepine   350 Tricyclics       093 Opiates          300 Cocaine          300 THC              50   . Color, Urine 07/11/2015 YELLOW  YELLOW Final  . APPearance 07/11/2015 CLOUDY* CLEAR Final  . Specific Gravity, Urine 07/11/2015 1.020  1.005 - 1.030 Final  . pH 07/11/2015 5.0  5.0 - 8.0 Final  . Glucose, UA 07/11/2015 100* NEGATIVE mg/dL Final  . Hgb urine dipstick 07/11/2015 MODERATE* NEGATIVE Final  . Bilirubin Urine 07/11/2015 NEGATIVE  NEGATIVE Final  . Ketones, ur 07/11/2015 NEGATIVE  NEGATIVE mg/dL Final  . Protein, ur 07/11/2015 NEGATIVE  NEGATIVE mg/dL  Final  . Nitrite 07/11/2015 NEGATIVE  NEGATIVE Final  . Leukocytes, UA 07/11/2015 NEGATIVE  NEGATIVE Final  . Specimen Description 07/11/2015 BLOOD RIGHT HAND   Final  . Special Requests 07/11/2015 BOTTLES DRAWN AEROBIC AND ANAEROBIC 5CC   Final  . Culture 07/11/2015 NO GROWTH 5 DAYS   Final  .  Report Status 07/11/2015 07/16/2015 FINAL   Final  . Specimen Description 07/11/2015 BLOOD RIGHT ARM   Final  . Special Requests 07/11/2015 BOTTLES DRAWN AEROBIC AND ANAEROBIC 5CC   Final  . Culture 07/11/2015 NO GROWTH 5 DAYS   Final  . Report Status 07/11/2015 07/16/2015 FINAL   Final  . Squamous Epithelial / LPF 07/11/2015 0-5* NONE SEEN Final  . WBC, UA 07/11/2015 0-5  0 - 5 WBC/hpf Final  . RBC / HPF 07/11/2015 6-30  0 - 5 RBC/hpf Final  . Bacteria, UA 07/11/2015 RARE* NONE SEEN Final  . Casts 07/11/2015 HYALINE CASTS* NEGATIVE Final  . Crystals 07/11/2015 URIC ACID CRYSTALS* NEGATIVE Final  . Lactic Acid, Venous 07/11/2015 4.57* 0.5 - 2.0 mmol/L Final  . Comment 07/11/2015 NOTIFIED PHYSICIAN   Final  . Sodium 07/11/2015 140  135 - 145 mmol/L Final  . Potassium 07/11/2015 4.0  3.5 - 5.1 mmol/L Final  . Chloride 07/11/2015 106  101 - 111 mmol/L Final  . BUN 07/11/2015 19  6 - 20 mg/dL Final  . Creatinine, Ser 07/11/2015 1.10  0.61 - 1.24 mg/dL Final  . Glucose, Bld 07/11/2015 127* 65 - 99 mg/dL Final  . Calcium, Ion 07/11/2015 1.13  1.13 - 1.30 mmol/L Final  . TCO2 07/11/2015 21  0 - 100 mmol/L Final  . Hemoglobin 07/11/2015 12.9* 13.0 - 17.0 g/dL Final  . HCT 07/11/2015 38.0* 39.0 - 52.0 % Final  . HIV Screen 4th Generation wRfx 07/11/2015 Non Reactive  Non Reactive Final   Comment: (NOTE) Performed At: [facility] [place], [place] [id number] Lindon Romp MD WU:[id number]   . L. pneumophila Serogp 1 Ur Ag 07/11/2015 Negative  Negative Final   Comment: (NOTE) Performed At: [facility] [place], [place] [id number] Lindon Romp MD  VH:[id number]   . Source of Sample 07/11/2015 URINE, CATHETERIZED   Final  . Strep Pneumo Urinary Antigen 07/11/2015 NEGATIVE  NEGATIVE Final   Comment:        Infection due to S. pneumoniae cannot be absolutely ruled out since the antigen present may be below the detection limit of the test.   . Lactic Acid, Venous 07/11/2015 4.15* 0.5 - 2.0 mmol/L Final  . Comment 07/11/2015 NOTIFIED PHYSICIAN   Final  . Glucose-Capillary 07/11/2015 78  65 - 99 mg/dL Final  . MRSA by PCR 07/11/2015 NEGATIVE  NEGATIVE Final   Comment:        The GeneXpert MRSA Assay (FDA approved for NASAL specimens only), is one component of a comprehensive MRSA colonization surveillance program. It is not intended to diagnose MRSA infection nor to guide or monitor treatment for MRSA infections.   . WBC 07/13/2015 7.4  4.0 - 10.5 K/uL Final  . RBC 07/13/2015 4.51  4.22 - 5.81 MIL/uL Final  . Hemoglobin 07/13/2015 10.7* 13.0 - 17.0 g/dL Final  . HCT 07/13/2015 33.1* 39.0 - 52.0 % Final  . MCV 07/13/2015 73.4* 78.0 - 100.0 fL Final  . MCH 07/13/2015 23.7* 26.0 - 34.0 pg Final  . MCHC 07/13/2015 32.3  30.0 - 36.0 g/dL Final  . RDW 07/13/2015 16.4* 11.5 - 15.5 % Final  . Platelets 07/13/2015 170  150 - 400 K/uL Final   Comment: SPECIMEN CHECKED FOR CLOTS REPEATED TO VERIFY   . Sodium 07/13/2015 142  135 - 145 mmol/L Final  . Potassium 07/13/2015 3.7  3.5 - 5.1 mmol/L Final  . Chloride 07/13/2015 112* 101 - 111 mmol/L Final  .  CO2 07/13/2015 23  22 - 32 mmol/L Final  . Glucose, Bld 07/13/2015 111* 65 - 99 mg/dL Final  . BUN 07/13/2015 14  6 - 20 mg/dL Final  . Creatinine, Ser 07/13/2015 1.17  0.61 - 1.24 mg/dL Final  . Calcium 07/13/2015 8.3* 8.9 - 10.3 mg/dL Final  . GFR calc non Af Amer 07/13/2015 >60  >60 mL/min Final  . GFR calc Af Amer 07/13/2015 >60  >60 mL/min Final   Comment: (NOTE) The eGFR has been calculated using the CKD EPI equation. This calculation has not been validated in all clinical  situations. eGFR's persistently <60 mL/min signify possible Chronic Kidney Disease.   . Anion gap 07/13/2015 7  5 - 15 Final  . Lactic Acid, Venous 07/13/2015 0.6  0.5 - 2.0 mmol/L Final  . WBC 07/14/2015 8.2  4.0 - 10.5 K/uL Final  . RBC 07/14/2015 4.78  4.22 - 5.81 MIL/uL Final  . Hemoglobin 07/14/2015 11.4* 13.0 - 17.0 g/dL Final  . HCT 07/14/2015 35.1* 39.0 - 52.0 % Final  . MCV 07/14/2015 73.4* 78.0 - 100.0 fL Final  . MCH 07/14/2015 23.8* 26.0 - 34.0 pg Final  . MCHC 07/14/2015 32.5  30.0 - 36.0 g/dL Final  . RDW 07/14/2015 16.4* 11.5 - 15.5 % Final  . Platelets 07/14/2015 185  150 - 400 K/uL Final   Comment: SPECIMEN CHECKED FOR CLOTS REPEATED TO VERIFY CONSISTENT WITH PREVIOUS RESULT   . Sodium 07/14/2015 141  135 - 145 mmol/L Final  . Potassium 07/14/2015 3.9  3.5 - 5.1 mmol/L Final  . Chloride 07/14/2015 111  101 - 111 mmol/L Final  . CO2 07/14/2015 23  22 - 32 mmol/L Final  . Glucose, Bld 07/14/2015 114* 65 - 99 mg/dL Final  . BUN 07/14/2015 8  6 - 20 mg/dL Final  . Creatinine, Ser 07/14/2015 1.09  0.61 - 1.24 mg/dL Final  . Calcium 07/14/2015 8.4* 8.9 - 10.3 mg/dL Final  . Total Protein 07/14/2015 6.1* 6.5 - 8.1 g/dL Final  . Albumin 07/14/2015 2.9* 3.5 - 5.0 g/dL Final  . AST 07/14/2015 113* 15 - 41 U/L Final  . ALT 07/14/2015 31  17 - 63 U/L Final  . Alkaline Phosphatase 07/14/2015 77  38 - 126 U/L Final  . Total Bilirubin 07/14/2015 1.5* 0.3 - 1.2 mg/dL Final  . GFR calc non Af Amer 07/14/2015 >60  >60 mL/min Final  . GFR calc Af Amer 07/14/2015 >60  >60 mL/min Final   Comment: (NOTE) The eGFR has been calculated using the CKD EPI equation. This calculation has not been validated in all clinical situations. eGFR's persistently <60 mL/min signify possible Chronic Kidney Disease.   . Anion gap 07/14/2015 7  5 - 15 Final  . Magnesium 07/14/2015 1.8  1.7 - 2.4 mg/dL Final    Ct Head Wo Contrast  07/11/2015  CLINICAL DATA:  Witnessed seizure. Last seen normal a  week ago. Baseline confusion has worsened. EXAM: CT HEAD WITHOUT CONTRAST TECHNIQUE: Contiguous axial images were obtained from the base of the skull through the vertex without intravenous contrast. COMPARISON:  06/13/2014 FINDINGS: There is no intracranial hemorrhage, mass or evidence of acute infarction. There is no extra-axial fluid collection. There is moderately severe generalized atrophy. Moderately severe periventricular white matter hypodensity is likely due to chronic small vessel ischemic disease. There is remote lacunar infarction in the right thalamus. There is remote left occipital infarction. No acute intracranial findings are evident. No bone abnormalities evident. The visible paranasal sinuses  are clear. IMPRESSION: Generalized atrophy and moderately severe chronic white matter changes likely due to small vessel disease. Remote infarctions. No acute intracranial findings. Electronically Signed   By: Andreas Newport M.D.   On: 07/11/2015 01:52   Dg Chest Portable 1 View  07/11/2015  CLINICAL DATA:  Seizure.  Dyspnea. EXAM: PORTABLE CHEST 1 VIEW COMPARISON:  01/29/2013 FINDINGS: Basilar opacities are present bilaterally due to atelectasis or infectious infiltrate. No large effusions. No pneumothorax. IMPRESSION: Bibasilar atelectasis or infectious infiltrate. Aspiration not excluded. Electronically Signed   By: Andreas Newport M.D.   On: 07/11/2015 02:54     Assessment/Plan   ICD-9-CM ICD-10-CM   1. Aspiration pneumonia, unspecified aspiration pneumonia type, unspecified laterality, unspecified part of lung ([place]) - improving 507.0 J69.0   2. Essential hypertension, benign 401.1 I10   3. Seizure ([place]) 780.39 R56.9   4. Type II diabetes mellitus with neurological manifestations ([place]) 250.60 E11.49   5. HLD (hyperlipidemia) 272.4 E78.5   6. BPH (benign prostatic hyperplasia) 600.00 N40.0   7. History of stroke V12.54 Z86.73   8. Vascular dementia, without behavioral disturbance 290.40  F01.50     Complete 7 days of Augmentin  Cont other meds as ordered  Repeat CBC w diff and BMP  PT/OT/ST as ordered  GOAL: short term rehab and d/c home when medically appropriate. Communicated with pt and nursing.  Will follow  Blayre Papania S. Perlie Gold  [facility] [address] [place], [place] [address] [phone] Cell (Monday-Friday 8 AM - 5 PM) [phone] After 5 PM and follow prompts

## 2015-07-30 DIAGNOSIS — H25813 Combined forms of age-related cataract, bilateral: Secondary | ICD-10-CM | POA: Diagnosis not present

## 2015-07-30 DIAGNOSIS — H04123 Dry eye syndrome of bilateral lacrimal glands: Secondary | ICD-10-CM | POA: Diagnosis not present

## 2015-07-30 DIAGNOSIS — H524 Presbyopia: Secondary | ICD-10-CM | POA: Diagnosis not present

## 2015-07-30 DIAGNOSIS — E119 Type 2 diabetes mellitus without complications: Secondary | ICD-10-CM | POA: Diagnosis not present

## 2015-07-30 DIAGNOSIS — Z7984 Long term (current) use of oral hypoglycemic drugs: Secondary | ICD-10-CM | POA: Diagnosis not present

## 2015-08-01 ENCOUNTER — Ambulatory Visit: Payer: Medicare Other | Admitting: Family Medicine

## 2015-08-13 NOTE — Telephone Encounter (Signed)
ERROR

## 2015-08-23 ENCOUNTER — Ambulatory Visit (INDEPENDENT_AMBULATORY_CARE_PROVIDER_SITE_OTHER): Payer: Medicare Other | Admitting: Internal Medicine

## 2015-08-23 ENCOUNTER — Encounter: Payer: Self-pay | Admitting: Internal Medicine

## 2015-08-23 VITALS — BP 122/60 | HR 67 | Ht 74.0 in | Wt 239.8 lb

## 2015-08-23 DIAGNOSIS — I10 Essential (primary) hypertension: Secondary | ICD-10-CM

## 2015-08-23 DIAGNOSIS — I639 Cerebral infarction, unspecified: Secondary | ICD-10-CM | POA: Diagnosis not present

## 2015-08-23 DIAGNOSIS — E785 Hyperlipidemia, unspecified: Secondary | ICD-10-CM

## 2015-08-23 LAB — CUP PACEART INCLINIC DEVICE CHECK: Date Time Interrogation Session: 20170113155954

## 2015-08-23 NOTE — Assessment & Plan Note (Signed)
He will continue his statin therapy. Will follow.  

## 2015-08-23 NOTE — Assessment & Plan Note (Signed)
His blood pressure is well controlled. He will continue his current meds. 

## 2015-08-23 NOTE — Patient Instructions (Addendum)
Medication Instructions:  Your physician recommends that you continue on your current medications as directed. Please refer to the Current Medication list given to you today.   Labwork: none  Testing/Procedures: none  Follow-Up: Your physician wants you to follow-up in: 12 months with Dr. Lovena Le. You will receive a reminder letter in the mail two months in advance. If you don't receive a letter, please call our office to schedule the follow-up appointment.  Remote monitoring is used to monitor your Pacemaker orICD from home. This monitoring reduces the number of office visits required to check your device to one time per year. It allows [place] to keep an eye on the functioning of your device to ensure it is working properly. You are scheduled for a device check from home on 11/25/15. You may send your transmission at any time that day. If you have a wireless device, the transmission will be sent automatically. After your physician reviews your transmission, you will receive a postcard with your next transmission date. IF YOU DO NOT HAVE MONITOR, YOU WILL NEED APPOINTMENT IN OFFICE TO HAVE YOUR DEVICE CHECKED.    Any Other Special Instructions Will Be Listed Below (If Applicable).  CALL: [phone]: [employer] to get a new remote monitoring system.   If you need a refill on your cardiac medications before your next appointment, please call your pharmacy.

## 2015-08-23 NOTE — Assessment & Plan Note (Signed)
The etiology of his stroke remains unclear. Interogation of his ILR does not demonstrate any evidence of atrial fib.

## 2015-08-23 NOTE — Progress Notes (Signed)
HPI Mark Soto returns today for followup of his ILR. 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 He had a negative workup and had an ILR inserted. He underwent rehab with improvement. He is deconditioned. He has generalized weakness. No other complaints. No palpitations. No Known Allergies   Current Outpatient Prescriptions  Medication Sig Dispense Refill  . amLODipine (NORVASC) 10 MG tablet Take 1 tablet (10 mg total) by mouth daily. 30 tablet 3  . amoxicillin-clavulanate (AUGMENTIN) 875-125 MG tablet Take 1 tablet by mouth 2 (two) times daily. X 7days 14 tablet 0  . aspirin 325 MG EC tablet Take 1 tablet (325 mg total) by mouth daily. 30 tablet 0  . atorvastatin (LIPITOR) 80 MG tablet Take 1 tablet (80 mg total) by mouth daily at 6 PM. 30 tablet 5  . ferrous sulfate 325 (65 FE) MG tablet Take 1 tablet (325 mg total) by mouth daily with breakfast. 90 tablet 1  . hydrALAZINE (APRESOLINE) 25 MG tablet Take 1 tablet (25 mg total) by mouth 3 (three) times daily. 90 tablet 3  . levETIRAcetam (KEPPRA) 500 MG tablet Take 1 tablet (500 mg total) by mouth 2 (two) times daily. 60 tablet 5  . metFORMIN (GLUCOPHAGE) 500 MG tablet Take 1 tablet (500 mg total) by mouth daily with breakfast. 90 tablet 1  . metoprolol tartrate (LOPRESSOR) 25 MG tablet Take 1 tablet (25 mg total) by mouth 2 (two) times daily. 60 tablet 3  . tamsulosin (FLOMAX) 0.4 MG CAPS capsule Take 1 capsule (0.4 mg total) by mouth daily after breakfast. 30 capsule 4   No current facility-administered medications for this visit.     Past Medical History  Diagnosis Date  . Hypertension   . Tinnitus     Right Ear  . Anemia   . Abdominal wall hernia   . Personal history of colonic adenomas 04/14/2013  . Acute left PCA stroke ([place]) 06/13/2014  . Seizures ([place]) 06/13/2014  . 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  . Hyperlipidemia   . BPH (benign prostatic  hyperplasia)   . Glucose intolerance (impaired glucose tolerance)     ROS:   All systems reviewed and negative except as noted in the HPI.   Past Surgical History  Procedure Laterality Date  . None    . Tee without cardioversion N/A 06/15/2014    Procedure: TRANSESOPHAGEAL ECHOCARDIOGRAM (TEE);  Surgeon: Mark Records, MD;  Location: [facility] ENDOSCOPY;  Service: Cardiovascular;  Laterality: N/A;  . Loop recorder implant N/A 06/19/2014    Procedure: LOOP RECORDER IMPLANT;  Surgeon: Mark Lance, MD;  Location: [facility] CATH LAB;  Service: Cardiovascular;  Laterality: N/A;     Family History  Problem Relation Age of Onset  . Breast cancer Mother   . COPD Father   . Hypertension Sister   . Hypertension Brother   . Hypertension Sister   . Liver cancer Mother   . Lung cancer Mother   . Colon cancer Neg Hx   . Colon polyps Neg Hx   . Stomach cancer Neg Hx      Social History   Social History  . Marital Status: Married    Spouse Name: N/A  . Number of Children: N/A  . Years of Education: N/A   Occupational History  . Retired    Social History Main Topics  . Smoking status: Never Smoker   .  Smokeless tobacco: Never Used  . Alcohol Use: No  . Drug Use: No  . Sexual Activity: Not on file   Other Topics Concern  . Not on file   Social History Narrative   Occasionally drinks coffee      Marital status:  Married x 43 years; first wife.      Children:   3 children; 5 grandchildren; 3 gg.      Lives: Lives with wife at home and 67 year old son. Son has special needs, sleep apnea, & severe seizure disorder. Wife has "many healthy problems". Another daughter in [place]. Daughter in [place].       Employment:  Retired at age 52.      Tobacco; none      Alcohol: rarely      Exercise:  none      3 sisters live in [place]. 1 brother in [place].       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 ILR demonstrates no atrial fib  Assess/Plan:

## 2015-10-30 ENCOUNTER — Telehealth: Payer: Self-pay | Admitting: *Deleted

## 2015-10-30 NOTE — Telephone Encounter (Signed)
[facility] requesting call back.  Gave device clinic phone number for return call.  Will ask patient if he has been able to call [employer] to order a new home monitor, or if we should plan to schedule a device clinic appointment to check his ILR.

## 2015-11-04 NOTE — Telephone Encounter (Signed)
Mamie Ma (EC) returned call.  She clarified that the SNF where patient currently resides has likely not ordered a new [employer] monitor.  She is agreeable to scheduling patient for an appointment in the device clinic on 11/25/15 at 12:00pm for an ILR check.  She will contact the SNF to make them aware.  She states that she hopes that once patient's Medicare coverage is straightened out that the facility will be agreeable to remote monitoring.  I will order a new monitor prior to this appointment and have it shipped to the device clinic so that patient and caregiver education can be provided at this appointment.

## 2015-11-07 NOTE — Telephone Encounter (Signed)
New [employer] monitor ordered.

## 2015-11-28 ENCOUNTER — Encounter: Payer: Self-pay | Admitting: Internal Medicine

## 2016-02-21 ENCOUNTER — Encounter: Payer: Self-pay | Admitting: Cardiology

## 2016-02-21 ENCOUNTER — Telehealth: Payer: Self-pay | Admitting: Cardiology

## 2016-02-21 NOTE — Telephone Encounter (Signed)
Attempted to call pt and request a manual transmission b/c home monitor has not updated in at least 14 days. I was informed that the number was incorrect.

## 2016-03-18 DIAGNOSIS — R569 Unspecified convulsions: Secondary | ICD-10-CM | POA: Diagnosis not present

## 2016-03-18 DIAGNOSIS — E785 Hyperlipidemia, unspecified: Secondary | ICD-10-CM | POA: Diagnosis not present

## 2016-03-18 DIAGNOSIS — E119 Type 2 diabetes mellitus without complications: Secondary | ICD-10-CM | POA: Diagnosis not present

## 2016-03-18 DIAGNOSIS — I10 Essential (primary) hypertension: Secondary | ICD-10-CM | POA: Diagnosis not present

## 2016-05-01 DIAGNOSIS — E785 Hyperlipidemia, unspecified: Secondary | ICD-10-CM | POA: Diagnosis not present

## 2016-05-01 DIAGNOSIS — N401 Enlarged prostate with lower urinary tract symptoms: Secondary | ICD-10-CM | POA: Diagnosis not present

## 2016-05-01 DIAGNOSIS — D649 Anemia, unspecified: Secondary | ICD-10-CM | POA: Diagnosis not present

## 2016-05-01 DIAGNOSIS — E119 Type 2 diabetes mellitus without complications: Secondary | ICD-10-CM | POA: Diagnosis not present

## 2016-05-01 DIAGNOSIS — R569 Unspecified convulsions: Secondary | ICD-10-CM | POA: Diagnosis not present

## 2016-06-18 DIAGNOSIS — Z8673 Personal history of transient ischemic attack (TIA), and cerebral infarction without residual deficits: Secondary | ICD-10-CM | POA: Diagnosis not present

## 2016-06-18 DIAGNOSIS — I10 Essential (primary) hypertension: Secondary | ICD-10-CM | POA: Diagnosis not present

## 2016-06-18 DIAGNOSIS — E119 Type 2 diabetes mellitus without complications: Secondary | ICD-10-CM | POA: Diagnosis not present

## 2016-06-18 DIAGNOSIS — E785 Hyperlipidemia, unspecified: Secondary | ICD-10-CM | POA: Diagnosis not present

## 2016-06-18 DIAGNOSIS — R569 Unspecified convulsions: Secondary | ICD-10-CM | POA: Diagnosis not present

## 2016-06-22 DIAGNOSIS — E785 Hyperlipidemia, unspecified: Secondary | ICD-10-CM | POA: Diagnosis not present

## 2016-06-22 DIAGNOSIS — E119 Type 2 diabetes mellitus without complications: Secondary | ICD-10-CM | POA: Diagnosis not present

## 2016-06-22 DIAGNOSIS — R569 Unspecified convulsions: Secondary | ICD-10-CM | POA: Diagnosis not present

## 2016-06-22 DIAGNOSIS — D649 Anemia, unspecified: Secondary | ICD-10-CM | POA: Diagnosis not present

## 2016-06-22 DIAGNOSIS — N401 Enlarged prostate with lower urinary tract symptoms: Secondary | ICD-10-CM | POA: Diagnosis not present

## 2016-06-25 ENCOUNTER — Telehealth: Payer: Self-pay | Admitting: *Deleted

## 2016-06-25 NOTE — Telephone Encounter (Signed)
Attempted to reach patient's daughter, cell number is wrong number, removed from chart.  LMOVM for remaining phone number for [facility] requesting call back, gave [facility] phone number.

## 2016-07-16 DIAGNOSIS — R569 Unspecified convulsions: Secondary | ICD-10-CM | POA: Diagnosis not present

## 2016-07-16 DIAGNOSIS — D649 Anemia, unspecified: Secondary | ICD-10-CM | POA: Diagnosis not present

## 2016-07-16 DIAGNOSIS — E785 Hyperlipidemia, unspecified: Secondary | ICD-10-CM | POA: Diagnosis not present

## 2016-07-16 DIAGNOSIS — I10 Essential (primary) hypertension: Secondary | ICD-10-CM | POA: Diagnosis not present

## 2016-07-16 DIAGNOSIS — N401 Enlarged prostate with lower urinary tract symptoms: Secondary | ICD-10-CM | POA: Diagnosis not present

## 2016-07-24 ENCOUNTER — Encounter: Payer: Self-pay | Admitting: *Deleted

## 2016-07-24 NOTE — Telephone Encounter (Signed)
LMOM for patient's daughter and at "other number" requesting call back, gave Device Clinic number.    Letter mailed requesting follow-up.

## 2016-07-31 ENCOUNTER — Encounter: Payer: Self-pay | Admitting: *Deleted

## 2016-07-31 NOTE — Telephone Encounter (Signed)
Certified letter mailed requesting follow-up.

## 2016-08-12 NOTE — Telephone Encounter (Signed)
Patient wife called in due to certified letter they received. I explained that a follow up appointment was needed. They declined to schedule appointment at this time and stated would call us back at later time to schedule due to note having a time available to come in soon right now.

## 2016-08-13 DIAGNOSIS — Z0000 Encounter for general adult medical examination without abnormal findings: Secondary | ICD-10-CM | POA: Diagnosis not present

## 2016-08-19 DIAGNOSIS — I739 Peripheral vascular disease, unspecified: Secondary | ICD-10-CM | POA: Diagnosis not present

## 2016-08-19 DIAGNOSIS — R600 Localized edema: Secondary | ICD-10-CM | POA: Diagnosis not present

## 2016-08-19 DIAGNOSIS — E119 Type 2 diabetes mellitus without complications: Secondary | ICD-10-CM | POA: Diagnosis not present

## 2016-08-19 DIAGNOSIS — Q845 Enlarged and hypertrophic nails: Secondary | ICD-10-CM | POA: Diagnosis not present

## 2016-08-19 DIAGNOSIS — B351 Tinea unguium: Secondary | ICD-10-CM | POA: Diagnosis not present

## 2016-09-10 DIAGNOSIS — N401 Enlarged prostate with lower urinary tract symptoms: Secondary | ICD-10-CM | POA: Diagnosis not present

## 2016-09-10 DIAGNOSIS — I10 Essential (primary) hypertension: Secondary | ICD-10-CM | POA: Diagnosis not present

## 2016-09-10 DIAGNOSIS — Z8673 Personal history of transient ischemic attack (TIA), and cerebral infarction without residual deficits: Secondary | ICD-10-CM | POA: Diagnosis not present

## 2016-09-10 DIAGNOSIS — R569 Unspecified convulsions: Secondary | ICD-10-CM | POA: Diagnosis not present

## 2016-09-10 DIAGNOSIS — E785 Hyperlipidemia, unspecified: Secondary | ICD-10-CM | POA: Diagnosis not present

## 2016-09-18 ENCOUNTER — Ambulatory Visit (INDEPENDENT_AMBULATORY_CARE_PROVIDER_SITE_OTHER): Payer: Medicare Other | Admitting: Internal Medicine

## 2016-09-18 ENCOUNTER — Encounter: Payer: Self-pay | Admitting: Internal Medicine

## 2016-09-18 VITALS — BP 134/82 | HR 56 | Ht 74.0 in | Wt 248.0 lb

## 2016-09-18 DIAGNOSIS — I639 Cerebral infarction, unspecified: Secondary | ICD-10-CM | POA: Diagnosis not present

## 2016-09-18 NOTE — Progress Notes (Signed)
HPI Mark Soto returns today for followup of his ILR. He is a pleasant 76 yo man with a h/o HTN and cryptogenic stroke dating back over a year ago. He had a negative workup and had an ILR inserted. He underwent rehab with improvement. He is deconditioned. He has generalized weakness. No other complaints. No palpitations. He has very mild peripheral edema but denies sob. No Known Allergies   Current Outpatient Prescriptions  Medication Sig Dispense Refill  . amLODipine (NORVASC) 10 MG tablet Take 1 tablet (10 mg total) by mouth daily. 30 tablet 3  . amoxicillin-clavulanate (AUGMENTIN) 875-125 MG tablet Take 1 tablet by mouth 2 (two) times daily. X 7days 14 tablet 0  . aspirin 325 MG EC tablet Take 1 tablet (325 mg total) by mouth daily. 30 tablet 0  . atorvastatin (LIPITOR) 80 MG tablet Take 1 tablet (80 mg total) by mouth daily at 6 PM. 30 tablet 5  . ferrous sulfate 325 (65 FE) MG tablet Take 1 tablet (325 mg total) by mouth daily with breakfast. 90 tablet 1  . hydrALAZINE (APRESOLINE) 25 MG tablet Take 1 tablet (25 mg total) by mouth 3 (three) times daily. 90 tablet 3  . levETIRAcetam (KEPPRA) 500 MG tablet Take 1 tablet (500 mg total) by mouth 2 (two) times daily. 60 tablet 5  . metFORMIN (GLUCOPHAGE) 500 MG tablet Take 1 tablet (500 mg total) by mouth daily with breakfast. 90 tablet 1  . metoprolol tartrate (LOPRESSOR) 25 MG tablet Take 1 tablet (25 mg total) by mouth 2 (two) times daily. 60 tablet 3  . tamsulosin (FLOMAX) 0.4 MG CAPS capsule Take 1 capsule (0.4 mg total) by mouth daily after breakfast. 30 capsule 4   No current facility-administered medications for this visit.      Past Medical History:  Diagnosis Date  . Abdominal wall hernia   . Acute left PCA stroke ([place]) 06/13/2014  . Anemia   . BPH (benign prostatic hyperplasia)   . Glucose intolerance (impaired glucose tolerance)   . Hyperlipidemia   . Hypertension   . 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  . Personal history of colonic adenomas 04/14/2013  . Seizures ([place]) 06/13/2014  . Tinnitus    Right Ear    ROS:   All systems reviewed and negative except as noted in the HPI.   Past Surgical History:  Procedure Laterality Date  . LOOP RECORDER IMPLANT N/A 06/19/2014   Procedure: LOOP RECORDER IMPLANT;  Surgeon: Evans Lance, MD;  Location: [facility] CATH LAB;  Service: Cardiovascular;  Laterality: N/A;  . none    . TEE WITHOUT CARDIOVERSION N/A 06/15/2014   Procedure: TRANSESOPHAGEAL ECHOCARDIOGRAM (TEE);  Surgeon: Fay Records, MD;  Location: [facility] ENDOSCOPY;  Service: Cardiovascular;  Laterality: N/A;     Family History  Problem Relation Age of Onset  . Breast cancer Mother   . COPD Father   . Hypertension Sister   . Hypertension Brother   . Hypertension Sister   . Liver cancer Mother   . Lung cancer Mother   . Colon cancer Neg Hx   . Colon polyps Neg Hx   . Stomach cancer Neg Hx      Social History   Social History  . Marital status: Married    Spouse name: N/A  . Number of children: N/A  . Years of education: N/A   Occupational History  . Retired    Social History Main Topics  .  Smoking status: Never Smoker  . Smokeless tobacco: Never Used  . Alcohol use No  . Drug use: No  . Sexual activity: Not on file   Other Topics Concern  . Not on file   Social History Narrative   Occasionally drinks coffee      Marital status:  Married x 43 years; first wife.      Children:   3 children; 5 grandchildren; 3 gg.      Lives: Lives with wife at home and 31 year old son. Son has special needs, sleep apnea, & severe seizure disorder. Wife has "many healthy problems". Another daughter in [place]. Daughter in [place].       Employment:  Retired at age 75.      Tobacco; none      Alcohol: rarely      Exercise:  none      3 sisters live in [place]. 1 brother in [place].       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 ILR demonstrates no atrial fib  Assess/Plan: 1. Cryptogenic stroke - the etiology remains unclear. He has mostly recovered with minimal residual deficits. 2. HTN heart disease - he has mostly been well controlled. Will follow. 3. ILR - his device is working normally. Will recheck in several months.  Mikle Bosworth.D.

## 2016-09-18 NOTE — Patient Instructions (Addendum)
Medication Instructions:  Your physician recommends that you continue on your current medications as directed. Please refer to the Current Medication list given to you today.   Labwork: None Ordered   Testing/Procedures: None Ordered   Follow-Up: Your physician wants you to follow-up in: 1 year with Dr. Taylor. You will receive a reminder letter in the mail two months in advance. If you don't receive a letter, please call our office to schedule the follow-up appointment.  Remote monitoring is used to monitor your Pacemaker from home. This monitoring reduces the number of office visits required to check your device to one time per year. It allows us to keep an eye on the functioning of your device to ensure it is working properly. You are scheduled for a device check from home on 12/17/16. You may send your transmission at any time that day. If you have a wireless device, the transmission will be sent automatically. After your physician reviews your transmission, you will receive a postcard with your next transmission date.     Any Other Special Instructions Will Be Listed Below (If Applicable).     If you need a refill on your cardiac medications before your next appointment, please call your pharmacy.   

## 2016-09-30 LAB — CUP PACEART INCLINIC DEVICE CHECK
Implantable Pulse Generator Implant Date: 20151110
MDC IDC SESS DTM: 20180209170947

## 2016-10-02 DIAGNOSIS — E785 Hyperlipidemia, unspecified: Secondary | ICD-10-CM | POA: Diagnosis not present

## 2016-10-02 DIAGNOSIS — R569 Unspecified convulsions: Secondary | ICD-10-CM | POA: Diagnosis not present

## 2016-10-02 DIAGNOSIS — I10 Essential (primary) hypertension: Secondary | ICD-10-CM | POA: Diagnosis not present

## 2016-10-16 DIAGNOSIS — E782 Mixed hyperlipidemia: Secondary | ICD-10-CM | POA: Diagnosis not present

## 2016-10-16 DIAGNOSIS — E039 Hypothyroidism, unspecified: Secondary | ICD-10-CM | POA: Diagnosis not present

## 2016-10-16 DIAGNOSIS — I10 Essential (primary) hypertension: Secondary | ICD-10-CM | POA: Diagnosis not present

## 2016-10-17 DIAGNOSIS — E785 Hyperlipidemia, unspecified: Secondary | ICD-10-CM | POA: Diagnosis not present

## 2016-10-17 DIAGNOSIS — D649 Anemia, unspecified: Secondary | ICD-10-CM | POA: Diagnosis not present

## 2016-10-17 DIAGNOSIS — R569 Unspecified convulsions: Secondary | ICD-10-CM | POA: Diagnosis not present

## 2016-10-17 DIAGNOSIS — Z8673 Personal history of transient ischemic attack (TIA), and cerebral infarction without residual deficits: Secondary | ICD-10-CM | POA: Diagnosis not present

## 2016-10-17 DIAGNOSIS — I10 Essential (primary) hypertension: Secondary | ICD-10-CM | POA: Diagnosis not present

## 2016-11-05 DIAGNOSIS — E785 Hyperlipidemia, unspecified: Secondary | ICD-10-CM | POA: Diagnosis not present

## 2016-11-05 DIAGNOSIS — E119 Type 2 diabetes mellitus without complications: Secondary | ICD-10-CM | POA: Diagnosis not present

## 2016-11-05 DIAGNOSIS — R350 Frequency of micturition: Secondary | ICD-10-CM | POA: Diagnosis not present

## 2016-11-06 DIAGNOSIS — E119 Type 2 diabetes mellitus without complications: Secondary | ICD-10-CM | POA: Diagnosis not present

## 2016-11-06 DIAGNOSIS — E785 Hyperlipidemia, unspecified: Secondary | ICD-10-CM | POA: Diagnosis not present

## 2016-11-06 DIAGNOSIS — I10 Essential (primary) hypertension: Secondary | ICD-10-CM | POA: Diagnosis not present

## 2016-11-06 DIAGNOSIS — R569 Unspecified convulsions: Secondary | ICD-10-CM | POA: Diagnosis not present

## 2016-11-11 DIAGNOSIS — H18413 Arcus senilis, bilateral: Secondary | ICD-10-CM | POA: Diagnosis not present

## 2016-11-11 DIAGNOSIS — H35033 Hypertensive retinopathy, bilateral: Secondary | ICD-10-CM | POA: Diagnosis not present

## 2016-11-11 DIAGNOSIS — H353131 Nonexudative age-related macular degeneration, bilateral, early dry stage: Secondary | ICD-10-CM | POA: Diagnosis not present

## 2016-11-11 DIAGNOSIS — E1100 Type 2 diabetes mellitus with hyperosmolarity without nonketotic hyperglycemic-hyperosmolar coma (NKHHC): Secondary | ICD-10-CM | POA: Diagnosis not present

## 2016-11-11 DIAGNOSIS — E11319 Type 2 diabetes mellitus with unspecified diabetic retinopathy without macular edema: Secondary | ICD-10-CM | POA: Diagnosis not present

## 2016-11-12 DIAGNOSIS — Z1211 Encounter for screening for malignant neoplasm of colon: Secondary | ICD-10-CM | POA: Diagnosis not present

## 2016-11-12 DIAGNOSIS — Z8673 Personal history of transient ischemic attack (TIA), and cerebral infarction without residual deficits: Secondary | ICD-10-CM | POA: Diagnosis not present

## 2016-11-12 DIAGNOSIS — I10 Essential (primary) hypertension: Secondary | ICD-10-CM | POA: Diagnosis not present

## 2016-11-12 DIAGNOSIS — D649 Anemia, unspecified: Secondary | ICD-10-CM | POA: Diagnosis not present

## 2016-11-12 DIAGNOSIS — E785 Hyperlipidemia, unspecified: Secondary | ICD-10-CM | POA: Diagnosis not present

## 2016-12-09 DIAGNOSIS — Q845 Enlarged and hypertrophic nails: Secondary | ICD-10-CM | POA: Diagnosis not present

## 2016-12-09 DIAGNOSIS — E119 Type 2 diabetes mellitus without complications: Secondary | ICD-10-CM | POA: Diagnosis not present

## 2016-12-09 DIAGNOSIS — R600 Localized edema: Secondary | ICD-10-CM | POA: Diagnosis not present

## 2016-12-09 DIAGNOSIS — I739 Peripheral vascular disease, unspecified: Secondary | ICD-10-CM | POA: Diagnosis not present

## 2016-12-10 DIAGNOSIS — E785 Hyperlipidemia, unspecified: Secondary | ICD-10-CM | POA: Diagnosis not present

## 2016-12-10 DIAGNOSIS — Z1211 Encounter for screening for malignant neoplasm of colon: Secondary | ICD-10-CM | POA: Diagnosis not present

## 2016-12-10 DIAGNOSIS — R569 Unspecified convulsions: Secondary | ICD-10-CM | POA: Diagnosis not present

## 2016-12-10 DIAGNOSIS — Z8673 Personal history of transient ischemic attack (TIA), and cerebral infarction without residual deficits: Secondary | ICD-10-CM | POA: Diagnosis not present

## 2016-12-10 DIAGNOSIS — I10 Essential (primary) hypertension: Secondary | ICD-10-CM | POA: Diagnosis not present

## 2016-12-17 DIAGNOSIS — I10 Essential (primary) hypertension: Secondary | ICD-10-CM | POA: Diagnosis not present

## 2016-12-17 DIAGNOSIS — R600 Localized edema: Secondary | ICD-10-CM | POA: Diagnosis not present

## 2017-01-01 DIAGNOSIS — I10 Essential (primary) hypertension: Secondary | ICD-10-CM | POA: Diagnosis not present

## 2017-01-01 DIAGNOSIS — E785 Hyperlipidemia, unspecified: Secondary | ICD-10-CM | POA: Diagnosis not present

## 2017-01-01 DIAGNOSIS — E119 Type 2 diabetes mellitus without complications: Secondary | ICD-10-CM | POA: Diagnosis not present

## 2017-01-07 DIAGNOSIS — D649 Anemia, unspecified: Secondary | ICD-10-CM | POA: Diagnosis not present

## 2017-01-07 DIAGNOSIS — I10 Essential (primary) hypertension: Secondary | ICD-10-CM | POA: Diagnosis not present

## 2017-01-07 DIAGNOSIS — E119 Type 2 diabetes mellitus without complications: Secondary | ICD-10-CM | POA: Diagnosis not present

## 2017-01-07 DIAGNOSIS — R569 Unspecified convulsions: Secondary | ICD-10-CM | POA: Diagnosis not present

## 2017-01-07 DIAGNOSIS — R600 Localized edema: Secondary | ICD-10-CM | POA: Diagnosis not present

## 2017-01-15 DIAGNOSIS — E782 Mixed hyperlipidemia: Secondary | ICD-10-CM | POA: Diagnosis not present

## 2017-01-15 DIAGNOSIS — E039 Hypothyroidism, unspecified: Secondary | ICD-10-CM | POA: Diagnosis not present

## 2017-01-15 DIAGNOSIS — I10 Essential (primary) hypertension: Secondary | ICD-10-CM | POA: Diagnosis not present

## 2017-01-20 DIAGNOSIS — D649 Anemia, unspecified: Secondary | ICD-10-CM | POA: Diagnosis not present

## 2017-01-20 DIAGNOSIS — R569 Unspecified convulsions: Secondary | ICD-10-CM | POA: Diagnosis not present

## 2017-01-20 DIAGNOSIS — R600 Localized edema: Secondary | ICD-10-CM | POA: Diagnosis not present

## 2017-01-20 DIAGNOSIS — I10 Essential (primary) hypertension: Secondary | ICD-10-CM | POA: Diagnosis not present

## 2017-01-20 DIAGNOSIS — E119 Type 2 diabetes mellitus without complications: Secondary | ICD-10-CM | POA: Diagnosis not present

## 2017-01-28 DIAGNOSIS — R600 Localized edema: Secondary | ICD-10-CM | POA: Diagnosis not present

## 2017-01-28 DIAGNOSIS — E785 Hyperlipidemia, unspecified: Secondary | ICD-10-CM | POA: Diagnosis not present

## 2017-01-28 DIAGNOSIS — E119 Type 2 diabetes mellitus without complications: Secondary | ICD-10-CM | POA: Diagnosis not present

## 2017-01-28 DIAGNOSIS — D649 Anemia, unspecified: Secondary | ICD-10-CM | POA: Diagnosis not present

## 2017-01-28 DIAGNOSIS — I10 Essential (primary) hypertension: Secondary | ICD-10-CM | POA: Diagnosis not present

## 2017-02-05 DIAGNOSIS — D649 Anemia, unspecified: Secondary | ICD-10-CM | POA: Diagnosis not present

## 2017-02-12 DIAGNOSIS — E119 Type 2 diabetes mellitus without complications: Secondary | ICD-10-CM | POA: Diagnosis not present

## 2017-02-12 DIAGNOSIS — N400 Enlarged prostate without lower urinary tract symptoms: Secondary | ICD-10-CM | POA: Diagnosis not present

## 2017-02-12 DIAGNOSIS — I10 Essential (primary) hypertension: Secondary | ICD-10-CM | POA: Diagnosis not present

## 2017-03-17 DIAGNOSIS — H25813 Combined forms of age-related cataract, bilateral: Secondary | ICD-10-CM | POA: Diagnosis not present

## 2017-03-17 DIAGNOSIS — H2181 Floppy iris syndrome: Secondary | ICD-10-CM | POA: Diagnosis not present

## 2017-03-17 DIAGNOSIS — H5703 Miosis: Secondary | ICD-10-CM | POA: Diagnosis not present

## 2017-03-17 DIAGNOSIS — H47032 Optic nerve hypoplasia, left eye: Secondary | ICD-10-CM | POA: Diagnosis not present

## 2017-03-17 DIAGNOSIS — H1713 Central corneal opacity, bilateral: Secondary | ICD-10-CM | POA: Diagnosis not present

## 2017-03-31 DIAGNOSIS — E119 Type 2 diabetes mellitus without complications: Secondary | ICD-10-CM | POA: Diagnosis not present

## 2017-03-31 DIAGNOSIS — E1159 Type 2 diabetes mellitus with other circulatory complications: Secondary | ICD-10-CM | POA: Diagnosis not present

## 2017-04-15 DIAGNOSIS — E119 Type 2 diabetes mellitus without complications: Secondary | ICD-10-CM | POA: Diagnosis not present

## 2017-04-15 DIAGNOSIS — E785 Hyperlipidemia, unspecified: Secondary | ICD-10-CM | POA: Diagnosis not present

## 2017-04-15 DIAGNOSIS — R569 Unspecified convulsions: Secondary | ICD-10-CM | POA: Diagnosis not present

## 2017-04-15 DIAGNOSIS — I10 Essential (primary) hypertension: Secondary | ICD-10-CM | POA: Diagnosis not present

## 2017-04-20 DIAGNOSIS — I10 Essential (primary) hypertension: Secondary | ICD-10-CM | POA: Diagnosis not present

## 2017-04-20 DIAGNOSIS — E119 Type 2 diabetes mellitus without complications: Secondary | ICD-10-CM | POA: Diagnosis not present

## 2017-06-07 DIAGNOSIS — I10 Essential (primary) hypertension: Secondary | ICD-10-CM | POA: Diagnosis not present

## 2017-06-07 DIAGNOSIS — E785 Hyperlipidemia, unspecified: Secondary | ICD-10-CM | POA: Diagnosis not present

## 2017-06-07 DIAGNOSIS — N400 Enlarged prostate without lower urinary tract symptoms: Secondary | ICD-10-CM | POA: Diagnosis not present

## 2017-06-07 DIAGNOSIS — R569 Unspecified convulsions: Secondary | ICD-10-CM | POA: Diagnosis not present

## 2017-07-04 DIAGNOSIS — E785 Hyperlipidemia, unspecified: Secondary | ICD-10-CM | POA: Diagnosis not present

## 2017-07-04 DIAGNOSIS — D509 Iron deficiency anemia, unspecified: Secondary | ICD-10-CM | POA: Diagnosis not present

## 2017-07-04 DIAGNOSIS — E119 Type 2 diabetes mellitus without complications: Secondary | ICD-10-CM | POA: Diagnosis not present

## 2017-07-04 DIAGNOSIS — R569 Unspecified convulsions: Secondary | ICD-10-CM | POA: Diagnosis not present

## 2017-07-30 DIAGNOSIS — E1165 Type 2 diabetes mellitus with hyperglycemia: Secondary | ICD-10-CM | POA: Diagnosis not present

## 2017-07-30 DIAGNOSIS — D509 Iron deficiency anemia, unspecified: Secondary | ICD-10-CM | POA: Diagnosis not present

## 2017-07-30 DIAGNOSIS — R569 Unspecified convulsions: Secondary | ICD-10-CM | POA: Diagnosis not present

## 2017-07-30 DIAGNOSIS — I10 Essential (primary) hypertension: Secondary | ICD-10-CM | POA: Diagnosis not present

## 2017-08-30 DIAGNOSIS — E119 Type 2 diabetes mellitus without complications: Secondary | ICD-10-CM | POA: Diagnosis not present

## 2017-08-30 DIAGNOSIS — R569 Unspecified convulsions: Secondary | ICD-10-CM | POA: Diagnosis not present

## 2017-08-30 DIAGNOSIS — I10 Essential (primary) hypertension: Secondary | ICD-10-CM | POA: Diagnosis not present

## 2017-09-22 DIAGNOSIS — E119 Type 2 diabetes mellitus without complications: Secondary | ICD-10-CM | POA: Diagnosis not present

## 2017-09-22 DIAGNOSIS — I10 Essential (primary) hypertension: Secondary | ICD-10-CM | POA: Diagnosis not present

## 2017-09-22 DIAGNOSIS — J069 Acute upper respiratory infection, unspecified: Secondary | ICD-10-CM | POA: Diagnosis not present

## 2017-09-22 DIAGNOSIS — R569 Unspecified convulsions: Secondary | ICD-10-CM | POA: Diagnosis not present

## 2017-09-29 DIAGNOSIS — E785 Hyperlipidemia, unspecified: Secondary | ICD-10-CM | POA: Diagnosis not present

## 2017-09-29 DIAGNOSIS — E119 Type 2 diabetes mellitus without complications: Secondary | ICD-10-CM | POA: Diagnosis not present

## 2017-09-29 DIAGNOSIS — I10 Essential (primary) hypertension: Secondary | ICD-10-CM | POA: Diagnosis not present

## 2017-10-06 DIAGNOSIS — R569 Unspecified convulsions: Secondary | ICD-10-CM | POA: Diagnosis not present

## 2017-10-06 DIAGNOSIS — N400 Enlarged prostate without lower urinary tract symptoms: Secondary | ICD-10-CM | POA: Diagnosis not present

## 2017-10-06 DIAGNOSIS — I10 Essential (primary) hypertension: Secondary | ICD-10-CM | POA: Diagnosis not present

## 2017-11-04 DIAGNOSIS — R569 Unspecified convulsions: Secondary | ICD-10-CM | POA: Diagnosis not present

## 2017-11-04 DIAGNOSIS — N400 Enlarged prostate without lower urinary tract symptoms: Secondary | ICD-10-CM | POA: Diagnosis not present

## 2017-11-04 DIAGNOSIS — E119 Type 2 diabetes mellitus without complications: Secondary | ICD-10-CM | POA: Diagnosis not present

## 2017-11-04 DIAGNOSIS — I10 Essential (primary) hypertension: Secondary | ICD-10-CM | POA: Diagnosis not present

## 2017-12-03 DIAGNOSIS — R0602 Shortness of breath: Secondary | ICD-10-CM | POA: Diagnosis not present

## 2017-12-03 DIAGNOSIS — N400 Enlarged prostate without lower urinary tract symptoms: Secondary | ICD-10-CM | POA: Diagnosis not present

## 2017-12-03 DIAGNOSIS — I10 Essential (primary) hypertension: Secondary | ICD-10-CM | POA: Diagnosis not present

## 2017-12-03 DIAGNOSIS — J449 Chronic obstructive pulmonary disease, unspecified: Secondary | ICD-10-CM | POA: Diagnosis not present

## 2017-12-03 DIAGNOSIS — E119 Type 2 diabetes mellitus without complications: Secondary | ICD-10-CM | POA: Diagnosis not present

## 2017-12-30 DIAGNOSIS — N400 Enlarged prostate without lower urinary tract symptoms: Secondary | ICD-10-CM | POA: Diagnosis not present

## 2017-12-30 DIAGNOSIS — E119 Type 2 diabetes mellitus without complications: Secondary | ICD-10-CM | POA: Diagnosis not present

## 2017-12-30 DIAGNOSIS — D509 Iron deficiency anemia, unspecified: Secondary | ICD-10-CM | POA: Diagnosis not present

## 2017-12-30 DIAGNOSIS — I10 Essential (primary) hypertension: Secondary | ICD-10-CM | POA: Diagnosis not present

## 2018-01-19 DIAGNOSIS — E119 Type 2 diabetes mellitus without complications: Secondary | ICD-10-CM | POA: Diagnosis not present

## 2018-01-19 DIAGNOSIS — E785 Hyperlipidemia, unspecified: Secondary | ICD-10-CM | POA: Diagnosis not present

## 2018-01-19 DIAGNOSIS — I10 Essential (primary) hypertension: Secondary | ICD-10-CM | POA: Diagnosis not present

## 2018-01-19 DIAGNOSIS — Z0189 Encounter for other specified special examinations: Secondary | ICD-10-CM | POA: Diagnosis not present

## 2018-02-06 DIAGNOSIS — R569 Unspecified convulsions: Secondary | ICD-10-CM | POA: Diagnosis not present

## 2018-02-06 DIAGNOSIS — I10 Essential (primary) hypertension: Secondary | ICD-10-CM | POA: Diagnosis not present

## 2018-02-06 DIAGNOSIS — E119 Type 2 diabetes mellitus without complications: Secondary | ICD-10-CM | POA: Diagnosis not present

## 2018-03-09 DIAGNOSIS — N400 Enlarged prostate without lower urinary tract symptoms: Secondary | ICD-10-CM | POA: Diagnosis not present

## 2018-03-09 DIAGNOSIS — E785 Hyperlipidemia, unspecified: Secondary | ICD-10-CM | POA: Diagnosis not present

## 2018-03-09 DIAGNOSIS — I10 Essential (primary) hypertension: Secondary | ICD-10-CM | POA: Diagnosis not present

## 2018-04-06 DIAGNOSIS — R569 Unspecified convulsions: Secondary | ICD-10-CM | POA: Diagnosis not present

## 2018-04-06 DIAGNOSIS — I10 Essential (primary) hypertension: Secondary | ICD-10-CM | POA: Diagnosis not present

## 2018-05-01 DIAGNOSIS — E119 Type 2 diabetes mellitus without complications: Secondary | ICD-10-CM | POA: Diagnosis not present

## 2018-05-01 DIAGNOSIS — I10 Essential (primary) hypertension: Secondary | ICD-10-CM | POA: Diagnosis not present

## 2018-06-01 DIAGNOSIS — I10 Essential (primary) hypertension: Secondary | ICD-10-CM | POA: Diagnosis not present

## 2018-06-01 DIAGNOSIS — E785 Hyperlipidemia, unspecified: Secondary | ICD-10-CM | POA: Diagnosis not present

## 2018-06-01 DIAGNOSIS — R569 Unspecified convulsions: Secondary | ICD-10-CM | POA: Diagnosis not present

## 2018-07-03 DIAGNOSIS — E119 Type 2 diabetes mellitus without complications: Secondary | ICD-10-CM | POA: Diagnosis not present

## 2018-07-03 DIAGNOSIS — R569 Unspecified convulsions: Secondary | ICD-10-CM | POA: Diagnosis not present

## 2018-07-20 DIAGNOSIS — E785 Hyperlipidemia, unspecified: Secondary | ICD-10-CM | POA: Diagnosis not present

## 2018-07-20 DIAGNOSIS — E119 Type 2 diabetes mellitus without complications: Secondary | ICD-10-CM | POA: Diagnosis not present

## 2018-07-26 DIAGNOSIS — R569 Unspecified convulsions: Secondary | ICD-10-CM | POA: Diagnosis not present

## 2018-07-26 DIAGNOSIS — I10 Essential (primary) hypertension: Secondary | ICD-10-CM | POA: Diagnosis not present

## 2018-07-26 DIAGNOSIS — E785 Hyperlipidemia, unspecified: Secondary | ICD-10-CM | POA: Diagnosis not present

## 2018-08-24 DIAGNOSIS — I10 Essential (primary) hypertension: Secondary | ICD-10-CM | POA: Diagnosis not present

## 2018-08-24 DIAGNOSIS — J449 Chronic obstructive pulmonary disease, unspecified: Secondary | ICD-10-CM | POA: Diagnosis not present

## 2018-08-24 DIAGNOSIS — E785 Hyperlipidemia, unspecified: Secondary | ICD-10-CM | POA: Diagnosis not present

## 2018-09-19 DIAGNOSIS — R569 Unspecified convulsions: Secondary | ICD-10-CM | POA: Diagnosis not present

## 2018-09-19 DIAGNOSIS — I10 Essential (primary) hypertension: Secondary | ICD-10-CM | POA: Diagnosis not present

## 2018-09-19 DIAGNOSIS — E119 Type 2 diabetes mellitus without complications: Secondary | ICD-10-CM | POA: Diagnosis not present

## 2018-10-26 DIAGNOSIS — D509 Iron deficiency anemia, unspecified: Secondary | ICD-10-CM | POA: Diagnosis not present

## 2018-10-26 DIAGNOSIS — I10 Essential (primary) hypertension: Secondary | ICD-10-CM | POA: Diagnosis not present

## 2018-11-24 DIAGNOSIS — R569 Unspecified convulsions: Secondary | ICD-10-CM | POA: Diagnosis not present

## 2018-11-24 DIAGNOSIS — E785 Hyperlipidemia, unspecified: Secondary | ICD-10-CM | POA: Diagnosis not present

## 2018-11-24 DIAGNOSIS — E119 Type 2 diabetes mellitus without complications: Secondary | ICD-10-CM | POA: Diagnosis not present

## 2018-12-14 DIAGNOSIS — D509 Iron deficiency anemia, unspecified: Secondary | ICD-10-CM | POA: Diagnosis not present

## 2018-12-14 DIAGNOSIS — D649 Anemia, unspecified: Secondary | ICD-10-CM | POA: Diagnosis not present

## 2018-12-14 DIAGNOSIS — R350 Frequency of micturition: Secondary | ICD-10-CM | POA: Diagnosis not present

## 2018-12-14 DIAGNOSIS — E785 Hyperlipidemia, unspecified: Secondary | ICD-10-CM | POA: Diagnosis not present

## 2018-12-14 DIAGNOSIS — E119 Type 2 diabetes mellitus without complications: Secondary | ICD-10-CM | POA: Diagnosis not present

## 2018-12-21 DIAGNOSIS — I10 Essential (primary) hypertension: Secondary | ICD-10-CM | POA: Diagnosis not present

## 2018-12-21 DIAGNOSIS — E785 Hyperlipidemia, unspecified: Secondary | ICD-10-CM | POA: Diagnosis not present

## 2018-12-21 DIAGNOSIS — R569 Unspecified convulsions: Secondary | ICD-10-CM | POA: Diagnosis not present

## 2019-01-23 DIAGNOSIS — I10 Essential (primary) hypertension: Secondary | ICD-10-CM | POA: Diagnosis not present

## 2019-01-23 DIAGNOSIS — R569 Unspecified convulsions: Secondary | ICD-10-CM | POA: Diagnosis not present

## 2019-01-23 DIAGNOSIS — E785 Hyperlipidemia, unspecified: Secondary | ICD-10-CM | POA: Diagnosis not present

## 2019-02-22 DIAGNOSIS — I10 Essential (primary) hypertension: Secondary | ICD-10-CM | POA: Diagnosis not present

## 2019-02-22 DIAGNOSIS — E119 Type 2 diabetes mellitus without complications: Secondary | ICD-10-CM | POA: Diagnosis not present

## 2019-02-22 DIAGNOSIS — E785 Hyperlipidemia, unspecified: Secondary | ICD-10-CM | POA: Diagnosis not present

## 2019-02-22 DIAGNOSIS — R569 Unspecified convulsions: Secondary | ICD-10-CM | POA: Diagnosis not present

## 2019-03-22 DIAGNOSIS — E785 Hyperlipidemia, unspecified: Secondary | ICD-10-CM | POA: Diagnosis not present

## 2019-03-22 DIAGNOSIS — E119 Type 2 diabetes mellitus without complications: Secondary | ICD-10-CM | POA: Diagnosis not present

## 2019-03-22 DIAGNOSIS — I10 Essential (primary) hypertension: Secondary | ICD-10-CM | POA: Diagnosis not present

## 2019-03-22 DIAGNOSIS — R569 Unspecified convulsions: Secondary | ICD-10-CM | POA: Diagnosis not present

## 2019-04-26 DIAGNOSIS — E785 Hyperlipidemia, unspecified: Secondary | ICD-10-CM | POA: Diagnosis not present

## 2019-04-26 DIAGNOSIS — I10 Essential (primary) hypertension: Secondary | ICD-10-CM | POA: Diagnosis not present

## 2019-04-26 DIAGNOSIS — R270 Ataxia, unspecified: Secondary | ICD-10-CM | POA: Diagnosis not present

## 2019-05-17 DIAGNOSIS — R6250 Unspecified lack of expected normal physiological development in childhood: Secondary | ICD-10-CM

## 2019-05-17 DIAGNOSIS — E860 Dehydration: Secondary | ICD-10-CM

## 2019-05-17 DIAGNOSIS — G40919 Epilepsy, unspecified, intractable, without status epilepticus: Secondary | ICD-10-CM

## 2019-05-17 DIAGNOSIS — G40812 Lennox-Gastaut syndrome, not intractable, without status epilepticus: Secondary | ICD-10-CM | POA: Diagnosis not present

## 2019-05-17 DIAGNOSIS — F840 Autistic disorder: Secondary | ICD-10-CM | POA: Diagnosis not present

## 2019-05-17 DIAGNOSIS — E785 Hyperlipidemia, unspecified: Secondary | ICD-10-CM

## 2019-05-18 DIAGNOSIS — G40812 Lennox-Gastaut syndrome, not intractable, without status epilepticus: Secondary | ICD-10-CM | POA: Diagnosis not present

## 2019-05-18 DIAGNOSIS — E860 Dehydration: Secondary | ICD-10-CM | POA: Diagnosis not present

## 2019-05-18 DIAGNOSIS — R6250 Unspecified lack of expected normal physiological development in childhood: Secondary | ICD-10-CM | POA: Diagnosis not present

## 2019-05-18 DIAGNOSIS — E785 Hyperlipidemia, unspecified: Secondary | ICD-10-CM | POA: Diagnosis not present

## 2019-05-18 DIAGNOSIS — F840 Autistic disorder: Secondary | ICD-10-CM | POA: Diagnosis not present

## 2019-05-25 DIAGNOSIS — E785 Hyperlipidemia, unspecified: Secondary | ICD-10-CM | POA: Diagnosis not present

## 2019-05-25 DIAGNOSIS — I10 Essential (primary) hypertension: Secondary | ICD-10-CM | POA: Diagnosis not present

## 2019-05-25 DIAGNOSIS — R569 Unspecified convulsions: Secondary | ICD-10-CM | POA: Diagnosis not present

## 2019-05-25 DIAGNOSIS — R270 Ataxia, unspecified: Secondary | ICD-10-CM | POA: Diagnosis not present

## 2019-06-19 DIAGNOSIS — R569 Unspecified convulsions: Secondary | ICD-10-CM | POA: Diagnosis not present

## 2019-06-19 DIAGNOSIS — I10 Essential (primary) hypertension: Secondary | ICD-10-CM | POA: Diagnosis not present

## 2019-06-19 DIAGNOSIS — E119 Type 2 diabetes mellitus without complications: Secondary | ICD-10-CM | POA: Diagnosis not present

## 2019-06-19 DIAGNOSIS — E785 Hyperlipidemia, unspecified: Secondary | ICD-10-CM | POA: Diagnosis not present

## 2019-07-20 DIAGNOSIS — I10 Essential (primary) hypertension: Secondary | ICD-10-CM | POA: Diagnosis not present

## 2019-07-20 DIAGNOSIS — E119 Type 2 diabetes mellitus without complications: Secondary | ICD-10-CM | POA: Diagnosis not present

## 2019-07-20 DIAGNOSIS — E785 Hyperlipidemia, unspecified: Secondary | ICD-10-CM | POA: Diagnosis not present

## 2019-07-20 DIAGNOSIS — R569 Unspecified convulsions: Secondary | ICD-10-CM | POA: Diagnosis not present

## 2020-08-13 ENCOUNTER — Emergency Department (HOSPITAL_COMMUNITY): Payer: Medicare HMO

## 2020-08-13 ENCOUNTER — Encounter (HOSPITAL_COMMUNITY): Payer: Self-pay | Admitting: Emergency Medicine

## 2020-08-13 ENCOUNTER — Inpatient Hospital Stay (HOSPITAL_COMMUNITY)
Admission: EM | Admit: 2020-08-13 | Discharge: 2020-08-16 | DRG: 194 | Disposition: A | Discharge status: Skilled Nursing Facility | Payer: Medicare HMO | Source: Skilled Nursing Facility | Attending: Student in an Organized Health Care Education/Training Program | Admitting: Student in an Organized Health Care Education/Training Program

## 2020-08-13 DIAGNOSIS — Z801 Family history of malignant neoplasm of trachea, bronchus and lung: Secondary | ICD-10-CM | POA: Diagnosis not present

## 2020-08-13 DIAGNOSIS — J189 Pneumonia, unspecified organism: Principal | ICD-10-CM

## 2020-08-13 DIAGNOSIS — Z20822 Contact with and (suspected) exposure to covid-19: Secondary | ICD-10-CM | POA: Diagnosis present

## 2020-08-13 DIAGNOSIS — Z800 Family history of malignant neoplasm of digestive organs: Secondary | ICD-10-CM

## 2020-08-13 DIAGNOSIS — W06XXXA Fall from bed, initial encounter: Secondary | ICD-10-CM | POA: Diagnosis present

## 2020-08-13 DIAGNOSIS — Y92122 Bedroom in nursing home as the place of occurrence of the external cause: Secondary | ICD-10-CM

## 2020-08-13 DIAGNOSIS — I10 Essential (primary) hypertension: Secondary | ICD-10-CM | POA: Diagnosis present

## 2020-08-13 DIAGNOSIS — F0390 Unspecified dementia without behavioral disturbance: Secondary | ICD-10-CM | POA: Diagnosis present

## 2020-08-13 DIAGNOSIS — E785 Hyperlipidemia, unspecified: Secondary | ICD-10-CM | POA: Diagnosis present

## 2020-08-13 DIAGNOSIS — F0150 Vascular dementia without behavioral disturbance: Secondary | ICD-10-CM | POA: Diagnosis present

## 2020-08-13 DIAGNOSIS — Z803 Family history of malignant neoplasm of breast: Secondary | ICD-10-CM

## 2020-08-13 DIAGNOSIS — R338 Other retention of urine: Secondary | ICD-10-CM | POA: Diagnosis present

## 2020-08-13 DIAGNOSIS — M6282 Rhabdomyolysis: Secondary | ICD-10-CM | POA: Diagnosis present

## 2020-08-13 DIAGNOSIS — I361 Nonrheumatic tricuspid (valve) insufficiency: Secondary | ICD-10-CM | POA: Diagnosis not present

## 2020-08-13 DIAGNOSIS — Z7984 Long term (current) use of oral hypoglycemic drugs: Secondary | ICD-10-CM | POA: Diagnosis not present

## 2020-08-13 DIAGNOSIS — Z79899 Other long term (current) drug therapy: Secondary | ICD-10-CM

## 2020-08-13 DIAGNOSIS — W19XXXA Unspecified fall, initial encounter: Secondary | ICD-10-CM | POA: Diagnosis present

## 2020-08-13 DIAGNOSIS — I499 Cardiac arrhythmia, unspecified: Secondary | ICD-10-CM | POA: Diagnosis present

## 2020-08-13 DIAGNOSIS — Z8673 Personal history of transient ischemic attack (TIA), and cerebral infarction without residual deficits: Secondary | ICD-10-CM | POA: Diagnosis not present

## 2020-08-13 DIAGNOSIS — E1149 Type 2 diabetes mellitus with other diabetic neurological complication: Secondary | ICD-10-CM | POA: Diagnosis present

## 2020-08-13 DIAGNOSIS — E1169 Type 2 diabetes mellitus with other specified complication: Secondary | ICD-10-CM | POA: Diagnosis present

## 2020-08-13 DIAGNOSIS — Z8249 Family history of ischemic heart disease and other diseases of the circulatory system: Secondary | ICD-10-CM

## 2020-08-13 DIAGNOSIS — R0902 Hypoxemia: Secondary | ICD-10-CM | POA: Diagnosis present

## 2020-08-13 DIAGNOSIS — E441 Mild protein-calorie malnutrition: Secondary | ICD-10-CM | POA: Diagnosis present

## 2020-08-13 DIAGNOSIS — Z825 Family history of asthma and other chronic lower respiratory diseases: Secondary | ICD-10-CM

## 2020-08-13 DIAGNOSIS — R509 Fever, unspecified: Secondary | ICD-10-CM | POA: Diagnosis present

## 2020-08-13 DIAGNOSIS — Q211 Atrial septal defect: Secondary | ICD-10-CM

## 2020-08-13 DIAGNOSIS — N401 Enlarged prostate with lower urinary tract symptoms: Secondary | ICD-10-CM | POA: Diagnosis present

## 2020-08-13 DIAGNOSIS — R821 Myoglobinuria: Secondary | ICD-10-CM | POA: Diagnosis present

## 2020-08-13 DIAGNOSIS — N179 Acute kidney failure, unspecified: Secondary | ICD-10-CM | POA: Diagnosis present

## 2020-08-13 DIAGNOSIS — Z6831 Body mass index (BMI) 31.0-31.9, adult: Secondary | ICD-10-CM

## 2020-08-13 DIAGNOSIS — I951 Orthostatic hypotension: Secondary | ICD-10-CM | POA: Diagnosis present

## 2020-08-13 DIAGNOSIS — Z7982 Long term (current) use of aspirin: Secondary | ICD-10-CM

## 2020-08-13 DIAGNOSIS — R55 Syncope and collapse: Secondary | ICD-10-CM | POA: Diagnosis not present

## 2020-08-13 DIAGNOSIS — R569 Unspecified convulsions: Secondary | ICD-10-CM | POA: Diagnosis present

## 2020-08-13 HISTORY — DX: Pneumonia, unspecified organism: J18.9

## 2020-08-13 LAB — POC SARS CORONAVIRUS 2 AG -  ED: SARS Coronavirus 2 Ag: NEGATIVE

## 2020-08-13 LAB — CBC WITH DIFFERENTIAL/PLATELET
Abs Immature Granulocytes: 0.02 10*3/uL (ref 0.00–0.07)
Basophils Absolute: 0 10*3/uL (ref 0.0–0.1)
Basophils Relative: 0 %
Eosinophils Absolute: 0 10*3/uL (ref 0.0–0.5)
Eosinophils Relative: 0 %
HCT: 35.8 % — ABNORMAL LOW (ref 39.0–52.0)
Hemoglobin: 11.2 g/dL — ABNORMAL LOW (ref 13.0–17.0)
Immature Granulocytes: 0 %
Lymphocytes Relative: 11 %
Lymphs Abs: 0.8 10*3/uL (ref 0.7–4.0)
MCH: 23.9 pg — ABNORMAL LOW (ref 26.0–34.0)
MCHC: 31.3 g/dL (ref 30.0–36.0)
MCV: 76.5 fL — ABNORMAL LOW (ref 80.0–100.0)
Monocytes Absolute: 1 10*3/uL (ref 0.1–1.0)
Monocytes Relative: 14 %
Neutro Abs: 5.5 10*3/uL (ref 1.7–7.7)
Neutrophils Relative %: 75 %
Platelets: 132 10*3/uL — ABNORMAL LOW (ref 150–400)
RBC: 4.68 MIL/uL (ref 4.22–5.81)
RDW: 16.6 % — ABNORMAL HIGH (ref 11.5–15.5)
WBC: 7.4 10*3/uL (ref 4.0–10.5)
nRBC: 0 % (ref 0.0–0.2)

## 2020-08-13 LAB — URINALYSIS, ROUTINE W REFLEX MICROSCOPIC
Bilirubin Urine: NEGATIVE
Glucose, UA: NEGATIVE mg/dL
Ketones, ur: NEGATIVE mg/dL
Leukocytes,Ua: NEGATIVE
Nitrite: NEGATIVE
Protein, ur: NEGATIVE mg/dL
Specific Gravity, Urine: 1.005 (ref 1.005–1.030)
pH: 5 (ref 5.0–8.0)

## 2020-08-13 LAB — CBG MONITORING, ED
Glucose-Capillary: 158 mg/dL — ABNORMAL HIGH (ref 70–99)
Glucose-Capillary: 132 mg/dL — ABNORMAL HIGH (ref 70–99)
Glucose-Capillary: 122 mg/dL — ABNORMAL HIGH (ref 70–99)

## 2020-08-13 LAB — COMPREHENSIVE METABOLIC PANEL
ALT: 28 U/L (ref 0–44)
AST: 51 U/L — ABNORMAL HIGH (ref 15–41)
Albumin: 3.2 g/dL — ABNORMAL LOW (ref 3.5–5.0)
Alkaline Phosphatase: 63 U/L (ref 38–126)
Anion gap: 10 (ref 5–15)
BUN: 16 mg/dL (ref 8–23)
CO2: 23 mmol/L (ref 22–32)
Calcium: 8.7 mg/dL — ABNORMAL LOW (ref 8.9–10.3)
Chloride: 104 mmol/L (ref 98–111)
Creatinine, Ser: 1.26 mg/dL — ABNORMAL HIGH (ref 0.61–1.24)
GFR, Estimated: 58 mL/min — ABNORMAL LOW (ref >60)
Glucose, Bld: 150 mg/dL — ABNORMAL HIGH (ref 70–99)
Potassium: 3.9 mmol/L (ref 3.5–5.1)
Sodium: 137 mmol/L (ref 135–145)
Total Bilirubin: 0.8 mg/dL (ref 0.3–1.2)
Total Protein: 6.7 g/dL (ref 6.5–8.1)

## 2020-08-13 LAB — FOLATE: Folate: 18.8 ng/mL (ref >5.9)

## 2020-08-13 LAB — VITAMIN B12: Vitamin B-12: 312 pg/mL (ref 180–914)

## 2020-08-13 LAB — HIV ANTIBODY (ROUTINE TESTING W REFLEX): HIV Screen 4th Generation wRfx: NONREACTIVE

## 2020-08-13 LAB — PROTIME-INR
INR: 1.2 (ref 0.8–1.2)
Prothrombin Time: 14.5 seconds (ref 11.4–15.2)

## 2020-08-13 LAB — RETICULOCYTES
Immature Retic Fract: 14.7 % (ref 2.3–15.9)
RBC.: 4.66 MIL/uL (ref 4.22–5.81)
Retic Count, Absolute: 29.4 10*3/uL (ref 19.0–186.0)
Retic Ct Pct: 0.6 % (ref 0.4–3.1)

## 2020-08-13 LAB — IRON AND TIBC
Iron: 40 ug/dL — ABNORMAL LOW (ref 45–182)
Saturation Ratios: 16 % — ABNORMAL LOW (ref 17.9–39.5)
TIBC: 253 ug/dL (ref 250–450)
UIBC: 213 ug/dL

## 2020-08-13 LAB — HEMOGLOBIN A1C
Hgb A1c MFr Bld: 6.2 % — ABNORMAL HIGH (ref 4.8–5.6)
Mean Plasma Glucose: 131.24 mg/dL

## 2020-08-13 LAB — FERRITIN: Ferritin: 1284 ng/mL — ABNORMAL HIGH (ref 24–336)

## 2020-08-13 LAB — APTT: aPTT: 39 seconds — ABNORMAL HIGH (ref 24–36)

## 2020-08-13 LAB — MAGNESIUM: Magnesium: 2 mg/dL (ref 1.7–2.4)

## 2020-08-13 LAB — PROCALCITONIN: Procalcitonin: 0.39 ng/mL

## 2020-08-13 LAB — RESP PANEL BY RT-PCR (FLU A&B, COVID) ARPGX2
Influenza A by PCR: NEGATIVE
Influenza B by PCR: NEGATIVE
SARS Coronavirus 2 by RT PCR: NEGATIVE

## 2020-08-13 LAB — CK: Total CK: 4761 U/L — ABNORMAL HIGH (ref 49–397)

## 2020-08-13 LAB — LACTIC ACID, PLASMA: Lactic Acid, Venous: 1.6 mmol/L (ref 0.5–1.9)

## 2020-08-13 MED ORDER — INSULIN ASPART 100 UNIT/ML ~~LOC~~ SOLN
0.0000 [IU] | Freq: Three times a day (TID) | SUBCUTANEOUS | Status: DC
  Administered 2020-08-13 – 2020-08-14 (×2): 1 [IU] via SUBCUTANEOUS

## 2020-08-13 MED ORDER — HYDRALAZINE HCL 25 MG PO TABS: 25.0000 mg | ORAL_TABLET | Freq: Three times a day (TID) | ORAL | Status: DC

## 2020-08-13 MED ORDER — AMLODIPINE BESYLATE 5 MG PO TABS: 5.0000 mg | ORAL_TABLET | Freq: Every day | ORAL | Status: DC

## 2020-08-13 MED ORDER — SERTRALINE HCL 100 MG PO TABS: 100.0000 mg | ORAL_TABLET | Freq: Every day | ORAL | Status: DC

## 2020-08-13 MED ORDER — ATORVASTATIN CALCIUM 10 MG PO TABS
10.0000 mg | ORAL_TABLET | Freq: Every day | ORAL | Status: DC
  Administered 2020-08-13: 10 mg via ORAL
  Filled 2020-08-13: qty 1

## 2020-08-13 MED ORDER — ENOXAPARIN SODIUM 40 MG/0.4ML ~~LOC~~ SOLN
40.0000 mg | SUBCUTANEOUS | Status: DC
  Administered 2020-08-13 – 2020-08-15 (×3): 40 mg via SUBCUTANEOUS
  Filled 2020-08-13 (×3): qty 0.4

## 2020-08-13 MED ORDER — GUAIFENESIN 100 MG/5ML PO SOLN: 10.0000 mL | ORAL | Status: DC | PRN

## 2020-08-13 MED ORDER — AZITHROMYCIN 250 MG PO TABS
500.0000 mg | ORAL_TABLET | Freq: Every day | ORAL | Status: AC
  Administered 2020-08-13: 500 mg via ORAL
  Filled 2020-08-13: qty 2

## 2020-08-13 MED ORDER — SODIUM CHLORIDE 0.9 % IV SOLN
1.0000 g | Freq: Once | INTRAVENOUS | Status: AC
  Administered 2020-08-13: 1 g via INTRAVENOUS
  Filled 2020-08-13: qty 10

## 2020-08-13 MED ORDER — METOPROLOL TARTRATE 25 MG PO TABS
25.0000 mg | ORAL_TABLET | Freq: Two times a day (BID) | ORAL | Status: DC
Start: 2020-08-13 — End: 2020-08-16
  Administered 2020-08-13 – 2020-08-16 (×7): 25 mg via ORAL
  Filled 2020-08-13 (×7): qty 1

## 2020-08-13 MED ORDER — INSULIN ASPART 100 UNIT/ML ~~LOC~~ SOLN: 0.0000 [IU] | Freq: Every day | SUBCUTANEOUS | Status: DC

## 2020-08-13 MED ORDER — SODIUM CHLORIDE 0.9 % IV SOLN
1.0000 g | INTRAVENOUS | Status: DC
  Administered 2020-08-14: 1 g via INTRAVENOUS
  Filled 2020-08-13: qty 1
  Filled 2020-08-13: qty 10

## 2020-08-13 MED ORDER — FERROUS SULFATE 325 (65 FE) MG PO TABS
325.0000 mg | ORAL_TABLET | Freq: Every day | ORAL | Status: DC
  Administered 2020-08-14: 325 mg via ORAL
  Filled 2020-08-13: qty 1

## 2020-08-13 MED ORDER — SODIUM CHLORIDE 0.9 % IV BOLUS
500.0000 mL | Freq: Once | INTRAVENOUS | Status: AC
  Administered 2020-08-13: 500 mL via INTRAVENOUS

## 2020-08-13 MED ORDER — AZITHROMYCIN 500 MG PO TABS
250.0000 mg | ORAL_TABLET | Freq: Every day | ORAL | Status: DC
  Administered 2020-08-14 – 2020-08-16 (×3): 250 mg via ORAL
  Filled 2020-08-13 (×3): qty 1

## 2020-08-13 MED ORDER — LEVETIRACETAM 500 MG PO TABS
500.0000 mg | ORAL_TABLET | Freq: Two times a day (BID) | ORAL | Status: DC
  Administered 2020-08-13 – 2020-08-16 (×7): 500 mg via ORAL
  Filled 2020-08-13 (×7): qty 1

## 2020-08-13 MED ORDER — DONEPEZIL HCL 10 MG PO TABS: 10.0000 mg | ORAL_TABLET | Freq: Every day | ORAL | Status: DC

## 2020-08-13 MED ORDER — ASPIRIN EC 81 MG PO TBEC
81.0000 mg | DELAYED_RELEASE_TABLET | Freq: Every day | ORAL | Status: DC
  Administered 2020-08-13 – 2020-08-16 (×4): 81 mg via ORAL
  Filled 2020-08-13 (×4): qty 1

## 2020-08-13 NOTE — H&P (Cosign Needed Addendum)
Date: 08/13/2020               Patient Name:  Mark Soto MRN: [id number]  DOB: [birth year] Age / Sex: 80 y.o., male   PCP: Bonnita Nasuti, MD         Medical Service: Internal Medicine Teaching Service         Attending Physician: Dr. Evette Doffing, Mallie Mussel, *    First Contact: Dr. Bridgett Larsson Pager: [phone]  Second Contact: Dr. Laural Golden Pager: [phone]       After Hours (After 5p/  First Contact Pager: [phone]  weekends / holidays): Second Contact Pager: [phone]   Chief Complaint: Fever, fall, cough  History of Present Illness: 80 y/o M with medical Hx of Dementia, CVA, seizures, HTN, HLD, DM2, Hx of latent syphilis, who brought by EMS from SNF due to fever, hypoxia and after found lying on the floor. Patient is able to provide history.  Initially, he mentions that he does not know the details about what brought him in but he is then able to explain what happened this morning.  History was taking from patient and also information documented by EMS.  [name] remembers that he fell this morning when he was getting up of his bed.  He endorses that he did not lose his consciousness, did not hit his head and was on the floor maybe about 20-30 minutes before someone found him.  He mentions that he was awake but not able to get up from the floor. He states that he might be weak. No seizure like activity reported. He denies any chest pain, dizziness, lightheadedness, palpitation or heart racing or shortness of breath prior to fall but mentions that he might have some shortness of breath when he was lying on the floor.  He had productive cough in past few days with yellowish sputum. He denies any fever or chills.  He has a roommate who has been healthy and asymptomatic.  [name] does not recall any choking episode and denies any history of difficulty swallowing.  He reports mild burning when he urinates but it has not been significant and without any frequency.  The of system is negative for  GI symptoms, lower extremity swelling, chest pain. EMS arrival, he was alert and responsive, febrile at 101, with "hot skin" per EMS report and and found to be hypoxic at 87%. He had normal sinus rhythm per EMS report on arrival.  His oxygen saturation improved to 95% on 4li supplemental nasal oxygen. He was given Tylenol 1000 mg PO and was brought to the hospital for further evaluation and management.  Meds:   Current Meds  Medication Sig  . amLODipine (NORVASC) 5 MG tablet Take 5 mg by mouth daily.  Marland Kitchen aspirin EC 81 MG tablet Take 81 mg by mouth daily. Swallow whole.  Marland Kitchen atorvastatin (LIPITOR) 10 MG tablet Take 10 mg by mouth daily.  Marland Kitchen donepezil (ARICEPT) 10 MG tablet Take 10 mg by mouth daily.  . ferrous sulfate 325 (65 FE) MG tablet Take 1 tablet (325 mg total) by mouth daily with breakfast.  . guaiFENesin (ROBITUSSIN) 100 MG/5ML liquid Take 10 mLs by mouth every 4 (four) hours as needed for cough.  . hydrALAZINE (APRESOLINE) 25 MG tablet Take 1 tablet (25 mg total) by mouth 3 (three) times daily.  Marland Kitchen levETIRAcetam (KEPPRA) 500 MG tablet Take 1 tablet (500 mg total) by mouth 2 (two) times daily.  Marland Kitchen lisinopril (ZESTRIL) 10 MG tablet Take  10 mg by mouth daily.  . metFORMIN (GLUCOPHAGE) 500 MG tablet Take 1 tablet (500 mg total) by mouth daily with breakfast.  . metoprolol tartrate (LOPRESSOR) 25 MG tablet Take 1 tablet (25 mg total) by mouth 2 (two) times daily.     Allergies: Allergies as of 08/13/2020  . (No Known Allergies)   Past Medical History:  Diagnosis Date  . Abdominal wall hernia   . Acute left PCA stroke ([facility]) 06/13/2014  . Anemia   . BPH (benign prostatic hyperplasia)   . Glucose intolerance (impaired glucose tolerance)   . Hyperlipidemia   . Hypertension   . 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  . Personal history of colonic adenomas 04/14/2013  . Seizures ([facility]) 06/13/2014  . Tinnitus    Right Ear    Family History:  Family History  Problem  Relation Age of Onset  . Breast cancer Mother   . Liver cancer Mother   . Lung cancer Mother   . COPD Father   . Hypertension Sister   . Hypertension Brother   . Hypertension Sister   . Colon cancer Neg Hx   . Colon polyps Neg Hx   . Stomach cancer Neg Hx     Social History:  Lives in SNF Is father of 3 (patient's report) Mentions that he can walk without walker or cane  Review of Systems: A complete ROS was negative except as per HPI.   Physical Exam: Blood pressure (!) 171/83, pulse 77, temperature 99 F (37.2 C), temperature source Oral, resp. rate 15, SpO2 94 %. Constitutional: Well-developed and well-nourished. No acute distress.  HENT:  Head and neck: No neck rigidity. Normocephalic and atraumatic.  Eyes: Senile arch, EOM nl Mucus membrane slightly dry Cardiovascular: RRR, nl S1S2, no murmur,  no LEE Respiratory: Effort normal and breath sounds normal. No respiratory distress. Breath sound mildly decreased at bases. Mild bibasilar crackle  GI: Soft. Bowel sounds are normal. No distension. There is no tenderness.  Neurological: Is alert and oriented x 3.  Motor: Left leg 4+/5 (cheonic per patient), Rt leg 5/5. Motor strength 5/5 on upper extremities.  Skin: Not diaphoretic. No erythema.  Psychiatric: Normal mood and affect. Behavior is normal. Judgment and thought content normal.   EKG: personally reviewed my interpretation is SNR. High voltage. No acute ST-T changes, pauses or AV block. PVC on cardiac monitor.   CXR: personally reviewed my interpretation is hypoventilation, bibasilar infiltration/opacities vs atelectasis   Cervical spine: Negative for cervical spine fracture.  Head CT: No acute intracranial abnormality. Atrophy and chronic ischemic changes   Assessment & Plan by Problem: Active Problems:   PNA (pneumonia)   Fever   Fall   Dementia CVA, seizures, dementia, HTN, hyperlipidemia, DM, Hx of latent syphilis  80 y.o. male with past medical history  of dementia, CVA, seizures, HTN, HLD, type II DM, syphilis who brought to ED by EMS after found on the floor at [facility]. He was alert and oriented (or mildly confused? Per one of EMS report?) but febrile at 101, hypoxic at 87% on EMS arrival and initially required 4 L of supplemental nasal oxygen.  In ED, he was saturating well at room air. Patient was admitted for management of probable PNA and also cardiac monitoring given he had transient bigeminate and 5 beats of Vtach on cardiac monitoring in ED. COVID and flu were negative.  Fall/syncopal episode Fever, transient hypoxia and CAP  Can be due to pneumona. Brought by  EMS from SNFafter found lying down on the floor. Was responsive and remembers he fell and was not able to get up w/o help. Febrile and hypoxic at 87% on EMS arrival. Improved to 95% on 4li.  Saturating well on room air now.   Some probable bibasilar opacities on CXR. (Infectious vs atelectasis or due to hypoinflation?)  His presentation can be due to pneumonia. Although CXR not very impressive for PNA, he reports productive cough and in setting of fever, will treat for (clinicaly diagnosed) CAP as likely etiology of his fever, transient hypoxia and weakness/fall. Also ordering Pro calcitonin.   I don't add Abx coverage for aspiration PNA today as the CXR changes are mostly bilateral but this can be considered if no clinical improvement.  COVID and flu are negative. No evidence of sepsis. Lactic acid normal at 1.6. No leukocytosis. No fever on ED but it was after given Tylenol by EMS. BC pending.   Other possible causes to consider: He has Hx of seizure and is on Keppra at home. How ever, no seizure like activity during today's event and he reports that he remember when and how he fell. I do not think EEG will be required or helpful. Checking Keppra level and continue home anti seizure med.   He denies prodromal symptoms such as dizziness or lightheadedness. He has chronic anemia with  no recent drop in Hb. Will check orthostatic vitals though in addition to basic anemia work up. Cardiovascular etiology: No actual syncope. He has murmur on exam. He found to have SNR on EMS evaluation. But will order non-stat echo for evaluation. PE can cause hypoxia and syncope. Not highly on DDx list based on his presentation. He is on Sertraline and Donepezil and no other centrally acting medication that could have caused his fall or could explain his high temperature. Will also check UDS. He has been alert and oriented and w/o encephalopathy to suspect encephalitis. No neurologic finding suggestive of meningitis.  -UDS -Continue Abx for now w Ceftriaxone and (adding) Azithromycin for presumed CAP -F/u BC and UC -Monitor Tempeture and VS  -Swallow eval. NPO except sip with meds until then -O2 therapy as needed to keep O2sat>96%. Currently at room air -Orthostatic vitals -Echo -Seizure precaution -Continue Metoprolol and holding rest of antihypertensive meds -Orthostatic vitals -NS 100 ml/h x 5h -F/u procalcitonin -F/U BC and UC -Holding centrally acting meds tonight  Chronic normocytic anemia: Pt is on ferrous sulfate. Last Ferritin 2014. He denies any symptoms of GI blood loss. Not on anticoagulation at home. Last colonoscopy in chart 2014. W sessile polyps that removed.  -Ferritin -Folate and Vitamin B12 (although normocytic) -Retic -CBC daily -Continue ferrous sulfate  Hx of Seizure -Continue seizure medications -Seizure precautions -Keppra level as above  UA suggestive of myoglobulinuria  -Gentle fluid -CK to evaluate for probable rhabdomyolysis  Mild AKI?: Creatinine 1.26. Recent baseline unknown.  -Will hold ACE and monitor.  -Bmp daily  Bigeminate and run of vatch per EDP: Improved Clinical presentation is not highly suggestive of cardiogenic event as cause of his fall. Will continue cardiac monitoring.  -Cardiac monitor -Replace electrolytes as  needed   Dispo: Admit patient to Inpatient with expected length of stay greater than 2 midnights.  SignedDewayne Hatch, MD 08/13/2020, 5:33 PM  Pager: [phone] After 5pm on weekdays and 1pm on weekends: On Call pager: [phone]

## 2020-08-13 NOTE — ED Provider Notes (Signed)
[facility] EMERGENCY DEPARTMENT Provider Note   CSN: [id number] Arrival date & time: 08/13/20  [id number]     History No chief complaint on file.   Mark Soto is a 80 y.o. male.  80 year old male with history of CVA, seizures, dementia, HTN, hyperlipidemia, DM, additional history as listed below, brought in by EMS from nursing facility. Patient was found lying on the floor this morning, temp of 101 with room air O2 sat of 87% with EMS. Patient does not remember falling, denies any injuries and states he is feeling fine today. When asked about sick symptoms, states he has had a slight cough, no known exposure to COVID that he is aware of. Patient is alert but a poor historian. Level 5 caveate applies.   Mark Soto was evaluated in Emergency Department on 08/13/2020 for the symptoms described in the history of present illness. He was evaluated in the context of the global COVID-19 pandemic, which necessitated consideration that the patient might be at risk for infection with the SARS-CoV-2 virus that causes COVID-19. Institutional protocols and algorithms that pertain to the evaluation of patients at risk for COVID-19 are in a state of rapid change based on information released by regulatory bodies including the CDC and federal and state organizations. These policies and algorithms were followed during the patient's care in the ED.         Past Medical History:  Diagnosis Date  . Abdominal wall hernia   . Acute left PCA stroke ([place]) 06/13/2014  . Anemia   . BPH (benign prostatic hyperplasia)   . Glucose intolerance (impaired glucose tolerance)   . Hyperlipidemia   . Hypertension   . 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  . Personal history of colonic adenomas 04/14/2013  . Seizures ([place]) 06/13/2014  . Tinnitus    Right Ear    Patient Active Problem List   Diagnosis Date Noted  . Type II diabetes mellitus with neurological manifestations ([place])  07/22/2015  . Dyslipidemia associated with type 2 diabetes mellitus ([place]) 07/22/2015  . Benign essential HTN   . CAP (community acquired pneumonia) 07/11/2015  . High anion gap metabolic acidosis 99991111  . Aspiration pneumonia ([place])   . Type 2 diabetes mellitus without complication ([place]) [id number]  . History of CVA (cerebrovascular accident) 09/11/2014  . Obesity (BMI 30.0-34.9) 09/11/2014  . Seizures ([place]) 09/04/2014  . Syphilis, latent 09/04/2014  . Essential hypertension 09/04/2014  . Glucose intolerance (impaired glucose tolerance) 09/04/2014  . Vascular dementia ([place]) 09/04/2014  . Essential hypertension, benign 06/25/2014  . BPH (benign prostatic hyperplasia) 06/25/2014  . Late latent syphilis   . Syphilis in male 06/15/2014  . HLD (hyperlipidemia)   . PFO (patent foramen ovale)   . Cerebral embolism with cerebral infarction ([place]) 06/14/2014  . Cryptogenic stroke ([place])   . Seizure ([place]) 06/13/2014  . Altered mental status   . Dementia ([place])   . History of colonic polyps 04/14/2013    Past Surgical History:  Procedure Laterality Date  . LOOP RECORDER IMPLANT N/A 06/19/2014   Procedure: LOOP RECORDER IMPLANT;  Surgeon: Evans Lance, MD;  Location: [facility] CATH LAB;  Service: Cardiovascular;  Laterality: N/A;  . none    . TEE WITHOUT CARDIOVERSION N/A 06/15/2014   Procedure: TRANSESOPHAGEAL ECHOCARDIOGRAM (TEE);  Surgeon: Fay Records, MD;  Location: [facility] ENDOSCOPY;  Service: Cardiovascular;  Laterality: N/A;       Family History  Problem Relation Age of Onset  .  Breast cancer Mother   . Liver cancer Mother   . Lung cancer Mother   . COPD Father   . Hypertension Sister   . Hypertension Brother   . Hypertension Sister   . Colon cancer Neg Hx   . Colon polyps Neg Hx   . Stomach cancer Neg Hx     Social History   Tobacco Use  . Smoking status: Never Smoker  . Smokeless tobacco: Never Used  Substance Use Topics  . Alcohol use: No    Alcohol/week: 0.0 standard  drinks  . Drug use: No    Home Medications Prior to Admission medications   Medication Sig Start Date End Date Taking? Authorizing Provider  amLODipine (NORVASC) 5 MG tablet Take 5 mg by mouth daily. 08/05/20  Yes [provider]  aspirin EC 81 MG tablet Take 81 mg by mouth daily. Swallow whole.   Yes [provider]  atorvastatin (LIPITOR) 10 MG tablet Take 10 mg by mouth daily. 07/18/20  Yes [provider]  donepezil (ARICEPT) 10 MG tablet Take 10 mg by mouth daily. 08/05/20  Yes [provider]  ferrous sulfate 325 (65 FE) MG tablet Take 1 tablet (325 mg total) by mouth daily with breakfast. 12/13/14  Yes Ethelda ChickSmith, Kristi M, MD  guaiFENesin (ROBITUSSIN) 100 MG/5ML liquid Take 10 mLs by mouth every 4 (four) hours as needed for cough. 07/18/20  Yes [provider]  hydrALAZINE (APRESOLINE) 25 MG tablet Take 1 tablet (25 mg total) by mouth 3 (three) times daily. 07/15/15  Yes Rai, Ripudeep K, MD  levETIRAcetam (KEPPRA) 500 MG tablet Take 1 tablet (500 mg total) by mouth 2 (two) times daily. 07/15/15  Yes Rai, Ripudeep K, MD  lisinopril (ZESTRIL) 10 MG tablet Take 10 mg by mouth daily.   Yes [provider]  metFORMIN (GLUCOPHAGE) 500 MG tablet Take 1 tablet (500 mg total) by mouth daily with breakfast. 12/11/14  Yes Ethelda ChickSmith, Kristi M, MD  metoprolol tartrate (LOPRESSOR) 25 MG tablet Take 1 tablet (25 mg total) by mouth 2 (two) times daily. 07/15/15  Yes Rai, Ripudeep K, MD  amLODipine (NORVASC) 10 MG tablet Take 1 tablet (10 mg total) by mouth daily. Patient not taking: Reported on 08/13/2020 07/15/15   Cathren Harshai, Ripudeep K, MD  aspirin 325 MG EC tablet Take 1 tablet (325 mg total) by mouth daily. Patient not taking: No sig reported 06/19/14   Elenora GammaBradshaw, Samuel L, MD  atorvastatin (LIPITOR) 80 MG tablet Take 1 tablet (80 mg total) by mouth daily at 6 PM. Patient not taking: Reported on 08/13/2020 07/15/15   Rai, Delene Ruffiniipudeep K, MD  sertraline (ZOLOFT) 100 MG tablet  Take 100 mg by mouth daily. 08/05/20   [provider]  tamsulosin (FLOMAX) 0.4 MG CAPS capsule Take 1 capsule (0.4 mg total) by mouth daily after breakfast. 07/15/15   Rai, Delene Ruffiniipudeep K, MD    Allergies    Patient has no known allergies.  Review of Systems   Review of Systems  Unable to perform ROS: Dementia    Physical Exam Updated Vital Signs BP (!) 159/83   Pulse 82   Temp 99.2 F (37.3 C)   Resp (!) 23   SpO2 93%   Physical Exam Vitals and nursing note reviewed.  Constitutional:      General: He is not in acute distress.    Appearance: He is well-developed and well-nourished. He is not diaphoretic.  HENT:     Head: Normocephalic and atraumatic.  Nose: Nose normal.     Mouth/Throat:     Mouth: Mucous membranes are moist.  Eyes:     Extraocular Movements: Extraocular movements intact.     Pupils: Pupils are equal, round, and reactive to light.  Neck:     Comments: c-collar in place, no step offs.  Cardiovascular:     Rate and Rhythm: Normal rate and regular rhythm.     Pulses: Normal pulses.     Heart sounds: Normal heart sounds.  Pulmonary:     Effort: Pulmonary effort is normal.     Breath sounds: Normal breath sounds.  Abdominal:     Palpations: Abdomen is soft.     Tenderness: There is no abdominal tenderness.  Musculoskeletal:        General: No tenderness. Normal range of motion.     Right lower leg: No edema.     Left lower leg: No edema.     Comments: No pain with log roll hips or with ROM upper and lower extremities   Skin:    General: Skin is warm and dry.     Findings: No erythema or rash.  Neurological:     Mental Status: He is alert. He is disoriented.     Comments: Alert to person (name and birth date), place. Reports president at [facility], unable to states year/month/recent holiday.   Psychiatric:        Mood and Affect: Mood and affect normal.        Behavior: Behavior normal.     ED Results / Procedures / Treatments   Labs (all  labs ordered are listed, but only abnormal results are displayed) Labs Reviewed  COMPREHENSIVE METABOLIC PANEL - Abnormal; Notable for the following components:      Result Value   Glucose, Bld 150 (*)    Creatinine, Ser 1.26 (*)    Calcium 8.7 (*)    Albumin 3.2 (*)    AST 51 (*)    GFR, Estimated 58 (*)    All other components within normal limits  CBC WITH DIFFERENTIAL/PLATELET - Abnormal; Notable for the following components:   Hemoglobin 11.2 (*)    HCT 35.8 (*)    MCV 76.5 (*)    MCH 23.9 (*)    RDW 16.6 (*)    Platelets 132 (*)    All other components within normal limits  APTT - Abnormal; Notable for the following components:   aPTT 39 (*)    All other components within normal limits  URINALYSIS, ROUTINE W REFLEX MICROSCOPIC - Abnormal; Notable for the following components:   Hgb urine dipstick LARGE (*)    Bacteria, UA RARE (*)    All other components within normal limits  CBG MONITORING, ED - Abnormal; Notable for the following components:   Glucose-Capillary 158 (*)    All other components within normal limits  RESP PANEL BY RT-PCR (FLU A&B, COVID) ARPGX2  CULTURE, BLOOD (SINGLE)  URINE CULTURE  LACTIC ACID, PLASMA  PROTIME-INR  POC SARS CORONAVIRUS 2 AG -  ED    EKG EKG Interpretation  Date/Time:  Tuesday August 13 2020 06:36:50 EST Ventricular Rate:  91 PR Interval:    QRS Duration: 90 QT Interval:  385 QTC Calculation: 474 R Axis:   0 Text Interpretation: Sinus rhythm Abnormal R-wave progression, early transition Left ventricular hypertrophy No significant change since last tracing Confirmed by Jacalyn Lefevre [phone]) on 08/13/2020 7:09:51 AM Also confirmed by Jacalyn Lefevre [phone]), editor Jac Canavan, [place] ([id number])  on  08/13/2020 11:40:06 AM   Radiology CT Head Wo Contrast  Result Date: 08/13/2020 CLINICAL DATA:  Fall EXAM: CT HEAD WITHOUT CONTRAST CT CERVICAL SPINE WITHOUT CONTRAST TECHNIQUE: Multidetector CT imaging of the head and cervical spine  was performed following the standard protocol without intravenous contrast. Multiplanar CT image reconstructions of the cervical spine were also generated. COMPARISON:  None. FINDINGS: CT HEAD FINDINGS Brain: Generalized atrophy. Moderate white matter changes with patchy and confluent white matter hypodensity bilaterally. Chronic infarct left occipital lobe. Negative for acute infarct, hemorrhage, mass. Vascular: Negative for hyperdense vessel Skull: Negative Sinuses/Orbits: Sinus mucosal disease.  Negative orbit Other: None CT CERVICAL SPINE FINDINGS Alignment: Normal Skull base and vertebrae: Negative for fracture Soft tissues and spinal canal: Negative for soft tissue swelling or mass. Disc levels: Mild disc degeneration and spurring at C4-5 and C5-6. No significant stenosis. Upper chest: Lung apices clear bilaterally Other: None IMPRESSION: 1. No acute intracranial abnormality. Atrophy and chronic ischemic changes as above 2. Negative for cervical spine fracture. Electronically Signed   By: Franchot Gallo M.D.   On: 08/13/2020 07:39   CT Cervical Spine Wo Contrast  Result Date: 08/13/2020 CLINICAL DATA:  Fall EXAM: CT HEAD WITHOUT CONTRAST CT CERVICAL SPINE WITHOUT CONTRAST TECHNIQUE: Multidetector CT imaging of the head and cervical spine was performed following the standard protocol without intravenous contrast. Multiplanar CT image reconstructions of the cervical spine were also generated. COMPARISON:  None. FINDINGS: CT HEAD FINDINGS Brain: Generalized atrophy. Moderate white matter changes with patchy and confluent white matter hypodensity bilaterally. Chronic infarct left occipital lobe. Negative for acute infarct, hemorrhage, mass. Vascular: Negative for hyperdense vessel Skull: Negative Sinuses/Orbits: Sinus mucosal disease.  Negative orbit Other: None CT CERVICAL SPINE FINDINGS Alignment: Normal Skull base and vertebrae: Negative for fracture Soft tissues and spinal canal: Negative for soft tissue  swelling or mass. Disc levels: Mild disc degeneration and spurring at C4-5 and C5-6. No significant stenosis. Upper chest: Lung apices clear bilaterally Other: None IMPRESSION: 1. No acute intracranial abnormality. Atrophy and chronic ischemic changes as above 2. Negative for cervical spine fracture. Electronically Signed   By: Franchot Gallo M.D.   On: 08/13/2020 07:39   DG Chest Port 1 View  Result Date: 08/13/2020 CLINICAL DATA:  Questionable sepsis. EXAM: PORTABLE CHEST 1 VIEW COMPARISON:  07/11/2015. FINDINGS: Mediastinum and hilar structures normal. Cardiac monitoring device noted. Borderline cardiomegaly. No pulmonary venous congestion. Low lung volumes. Mild bibasilar atelectasis. Mild bibasilar infiltrates cannot be excluded. No pleural effusion or pneumothorax. IMPRESSION: 1. Borderline cardiomegaly.  No pulmonary venous congestion. 2. Low lung volumes with mild bibasilar atelectasis. Mild bibasilar infiltrates cannot be excluded. Electronically Signed   By: Marcello Moores  Register   On: 08/13/2020 07:08    Procedures Procedures (including critical care time)  Medications Ordered in ED Medications  cefTRIAXone (ROCEPHIN) 1 g in sodium chloride 0.9 % 100 mL IVPB (1 g Intravenous New Bag/Given 08/13/20 1242)  sodium chloride 0.9 % bolus 500 mL (0 mLs Intravenous Stopped 08/13/20 1242)    ED Course  I have reviewed the triage vital signs and the nursing notes.  Pertinent labs & imaging results that were available during my care of the patient were reviewed by me and considered in my medical decision making (see chart for details).  Clinical Course as of 08/13/20 1305  Tue Aug 14, 6527  5928 80 year old male found on the floor at nursing facility, not anticoagulated. Found febrile and hypoxic by EMS. Placed on [place], O2 in  improvement in sats, discontinued while in the ER, remains greater than 90%, will monitor.  [LM]  0659 Sepsis orders initiated, consider COVID, will hold on fluid bolus and  antibiotics pending COVID testing. CT head/c-spine ordered for unwitnessed fall in patient who is a poor historian.  [LM]  0724 EKG 12-Lead [MS]  1104 COVID/flu PCR negative. CMP with Cr 1.26, given 565mL NS bolus. CBC without significant changes from prior, WBC WNL.  Lactive acid WNL at 1.6. While awaiting UA, cardiac monitoring reveals bigeminy and v-tach (limited). Returned to NSR. Plan is to admit to hospitalist for possible PNA (questionable on CXR, with hypoxia and fever with EMS). Patient was given Rocephin. O2 sat currently 92% on room air.  Care managed with Dr. Gilford Raid, ER attending who has seen the patient. CT head and c-spine without acute findings. CXR shows borderline cardiomegaly (portable study), no pulmonary venous congestion.  Low lung volumes with mild bibasilar atelectasis.  Mild bibasilar infiltrates cannot be excluded. [LM]  1304 Case discussed with internal medicine service who will consult for admission. [LM]    Clinical Course User Index [LM] Tacy Learn, PA-C [MS] Eulis Foster, MD   MDM Rules/Calculators/A&P                          Final Clinical Impression(s) / ED Diagnoses Final diagnoses:  Cardiac arrhythmia, unspecified cardiac arrhythmia type  Community acquired pneumonia, unspecified laterality    Rx / DC Orders ED Discharge Orders    None       Roque Lias 08/13/20 1305    Isla Pence, MD 08/13/20 1433

## 2020-08-13 NOTE — Progress Notes (Signed)
Patient arrived on the unit from the ER on a stretcher, assessment completed see flowsheet, placed on tele ccmd notified, patient oriented to room and staff, bed in lowest position call bell within reach will continue to monitor.  

## 2020-08-13 NOTE — ED Notes (Signed)
Patient resting in bed, no issues noted at this time  Condom cath in place

## 2020-08-13 NOTE — ED Triage Notes (Signed)
Pt here from Nursing home with c/o fall  And weakness , 101 fever, sats 87% on room air , 95% on 4 liters alert but confused according to EMS

## 2020-08-13 NOTE — ED Notes (Signed)
Patient continues to get out of bed without staff at bedside.  Has been educated, verbalizes understanding.

## 2020-08-13 NOTE — ED Notes (Signed)
Pt's daughter - Pieter Partridge was updated on current status.

## 2020-08-13 NOTE — ED Notes (Signed)
Report given to floor Norberto Sorenson, RN

## 2020-08-13 NOTE — Hospital Course (Addendum)
Admitted 08/13/2020  Allergies: Patient has no known allergies. Pertinent Hx:   80 y/o M with medical Hx of Dementia, CVA, seizures, HTN, HLD, DM2,  latent syphilis, who was brought by EMS from SNF following a mechanical fall without rise, with notable fever and hypoxia, and admitted for community acquired pneumonia and cardiac monitoring.     Community Acquired Pneumonia Patient febrile to 101, and hypoxic at 87 on EMS arrival following extended time on floor after fall. Improved respiration on 4L supplemental oxygen. Given Tylenol 1000 mg p.o; and brought to hospital. Given IV Ceftriaxone, and Azithromycin 250 mg. Tmax of 100.3 during course of hospitalization, with normal WBC. Chest xray showed borderline cardiomegaly without notable pulmonary venous congestion; and mild bibasilar atelectasis vs infiltrates. F/u echo results pending. Blood culture without growth. Patient to transition from IV Ceftriaxone to Cefdinir 300 mg bid, with Azithromycin 250 mg daily for 3 days.   Chronic anemia Patient has chronic hx of anemia, on home ferrous sulfate. Denied symptoms of GI blood loss. Last colonoscopy in 2014 (sessile polyps that were removed). Mild microcytic anemia with hgb of 12.1-->11.7, low iron, low-normal TIBC, and ferritin elevated at 1,284 likely due to an acute inflammatory reaction in the context of his pneumonia. Home iron held in light of current CAP infection. Continued monitoring with out-patient CBC, resume ferrous sulfate when infection cleared.    Rhabdomyolysis Patient has a hx of seizures but denies seizure-like activity preceding event. Mild Rhabdomyolysis likely secondary to fall and extended stay on ground. Evident per urinalysis and elevated CK at 4,761 on admission--->4, 764 at discharge. He received LR bolus and infusion, with notable resolution to his mild associated AKI. Creatinine at baseline prior to discharge (1.06 from 1.26 on admission)   Mild AKI Secondary to  Rhabdomyolysis & Malnutrition from Infectious Process  Patient creatinine mildly elevated at 1.26 on admission. Likely contribution of myoglobinuria and mild malnutrition secondary to infection. Resolved following fluid administration. Creatinine near baseline, at 1.06 upon discharge.    Orthostatic hypotension Patient reports long-standing intermittent lightheadedness on standing preceding admission. Positive orthostatic vitals with three minute standing, and endorsing lightheadedness with quick standing. Fluids were given, and home amlodipine held. Evaluated by PT, with impression of baseline proximity, though would benefit from gait training with Home health PT, and rolling walker for assistance. Patient amlodipine resumed in consideration of his HTN, but ultimately held due to continued hypotension. Recommend adjustment to hypertensive medication dosage in outpatient setting in light of this chronic concern.    Urinary Retention Patient on home Flomax. This was initially held in light of his orthostatic hypotension. Urinary retention per bladder scan, requiring catheter. Flomax resumed, with appropriate urine output following.    Mild Hyperbilirubinemia Patient bilirubin elevated to 1.4, with mildly elevated AST at 99; and normal ALT and Alk Phos during in-patient course. Afebrile with normal WBC, and without abdominal pain. Repeat LFTs outpatient.

## 2020-08-14 ENCOUNTER — Other Ambulatory Visit (HOSPITAL_COMMUNITY): Payer: Medicare HMO

## 2020-08-14 ENCOUNTER — Encounter (HOSPITAL_COMMUNITY): Payer: Self-pay | Admitting: Student in an Organized Health Care Education/Training Program

## 2020-08-14 ENCOUNTER — Other Ambulatory Visit: Payer: Self-pay

## 2020-08-14 DIAGNOSIS — J189 Pneumonia, unspecified organism: Secondary | ICD-10-CM | POA: Diagnosis not present

## 2020-08-14 DIAGNOSIS — M6282 Rhabdomyolysis: Secondary | ICD-10-CM | POA: Diagnosis not present

## 2020-08-14 DIAGNOSIS — I10 Essential (primary) hypertension: Secondary | ICD-10-CM

## 2020-08-14 LAB — CBC WITH DIFFERENTIAL/PLATELET
Abs Immature Granulocytes: 0.01 10*3/uL (ref 0.00–0.07)
Basophils Absolute: 0 10*3/uL (ref 0.0–0.1)
Basophils Relative: 0 %
Eosinophils Absolute: 0 10*3/uL (ref 0.0–0.5)
Eosinophils Relative: 1 %
HCT: 37.2 % — ABNORMAL LOW (ref 39.0–52.0)
Hemoglobin: 12.1 g/dL — ABNORMAL LOW (ref 13.0–17.0)
Immature Granulocytes: 0 %
Lymphocytes Relative: 25 %
Lymphs Abs: 1.3 10*3/uL (ref 0.7–4.0)
MCH: 24.6 pg — ABNORMAL LOW (ref 26.0–34.0)
MCHC: 32.5 g/dL (ref 30.0–36.0)
MCV: 75.8 fL — ABNORMAL LOW (ref 80.0–100.0)
Monocytes Absolute: 1 10*3/uL (ref 0.1–1.0)
Monocytes Relative: 18 %
Neutro Abs: 3 10*3/uL (ref 1.7–7.7)
Neutrophils Relative %: 56 %
Platelets: 124 10*3/uL — ABNORMAL LOW (ref 150–400)
RBC: 4.91 MIL/uL (ref 4.22–5.81)
RDW: 16.6 % — ABNORMAL HIGH (ref 11.5–15.5)
WBC: 5.4 10*3/uL (ref 4.0–10.5)
nRBC: 0 % (ref 0.0–0.2)

## 2020-08-14 LAB — COMPREHENSIVE METABOLIC PANEL
ALT: 29 U/L (ref 0–44)
AST: 95 U/L — ABNORMAL HIGH (ref 15–41)
Albumin: 2.9 g/dL — ABNORMAL LOW (ref 3.5–5.0)
Alkaline Phosphatase: 60 U/L (ref 38–126)
Anion gap: 11 (ref 5–15)
BUN: 16 mg/dL (ref 8–23)
CO2: 26 mmol/L (ref 22–32)
Calcium: 8.6 mg/dL — ABNORMAL LOW (ref 8.9–10.3)
Chloride: 103 mmol/L (ref 98–111)
Creatinine, Ser: 1.03 mg/dL (ref 0.61–1.24)
GFR, Estimated: >60 mL/min (ref >60)
Glucose, Bld: 110 mg/dL — ABNORMAL HIGH (ref 70–99)
Potassium: 4.2 mmol/L (ref 3.5–5.1)
Sodium: 140 mmol/L (ref 135–145)
Total Bilirubin: 0.6 mg/dL (ref 0.3–1.2)
Total Protein: 6.7 g/dL (ref 6.5–8.1)

## 2020-08-14 LAB — URINE CULTURE: Culture: NO GROWTH

## 2020-08-14 LAB — GLUCOSE, CAPILLARY
Glucose-Capillary: 105 mg/dL — ABNORMAL HIGH (ref 70–99)
Glucose-Capillary: 134 mg/dL — ABNORMAL HIGH (ref 70–99)
Glucose-Capillary: 115 mg/dL — ABNORMAL HIGH (ref 70–99)
Glucose-Capillary: 117 mg/dL — ABNORMAL HIGH (ref 70–99)

## 2020-08-14 LAB — CK
Total CK: 4764 U/L — ABNORMAL HIGH (ref 49–397)
Total CK: 3906 U/L — ABNORMAL HIGH (ref 49–397)

## 2020-08-14 MED ORDER — AMLODIPINE BESYLATE 5 MG PO TABS
5.0000 mg | ORAL_TABLET | Freq: Every day | ORAL | Status: DC
Start: 2020-08-14 — End: 2020-08-15
  Administered 2020-08-14 – 2020-08-15 (×2): 5 mg via ORAL
  Filled 2020-08-14 (×2): qty 1

## 2020-08-14 MED ORDER — AMLODIPINE BESYLATE 10 MG PO TABS: 10.0000 mg | ORAL_TABLET | Freq: Every day | ORAL | Status: DC

## 2020-08-14 MED ORDER — LACTATED RINGERS IV BOLUS
500.0000 mL | Freq: Once | INTRAVENOUS | Status: AC
  Administered 2020-08-14: 500 mL via INTRAVENOUS

## 2020-08-14 MED ORDER — LACTATED RINGERS IV SOLN: INTRAVENOUS | Status: DC

## 2020-08-14 NOTE — Progress Notes (Addendum)
   Subjective: Patient reports doing well, and endorses urinating without concern. Denies lightheadedness or dizziness, sob, and palpitations. Notes feels lightheaded if stands up too quickly from time to time.    Objective:  Vital signs in last 24 hours: Vitals:   08/13/20 2238 08/14/20 0008 08/14/20 0443 08/14/20 0752  BP: (!) 151/83 (!) 144/46 (!) 155/73 (!) 185/78  Pulse: 61 60 64 62  Resp: 19 20 19 15   Temp: 98.2 F (36.8 C) 98.1 F (36.7 C) 98.1 F (36.7 C) 98.2 F (36.8 C)  TempSrc: Oral Oral Oral Oral  SpO2: 92% 98% 97% 90%  Weight: 105.1 kg     Height: 6' (1.829 m)      Weight change:   Intake/Output Summary (Last 24 hours) at 08/14/2020 1011 Last data filed at 08/14/2020 10/12/2020 Gross per 24 hour  Intake 1700.21 ml  Output 575 ml  Net 1125.21 ml    GENERAL- Alert, co-operative, appears as stated age. No acute distress.  HEENT- Atraumatic, normocephalic CARDIAC- RRR, no murmurs, rubs or gallops. RESP-breathing comfortably, mild bibasilar crackles with greater prominence on left. No respiratory distress. NEURO- alert and oriented x2, grossly nonfocal EXTREMITIES- no pedal edema. SKIN- Warm, dry. PSYCH- Normal mood and affect, appropriate thought content and speech.  Assessment/Plan:  Principal Problem:   CAP (community acquired pneumonia) Active Problems:   Vascular dementia (HCC)   Type II diabetes mellitus with neurological manifestations (HCC)   PNA (pneumonia)   Fall   Rhabdomyolysis   Community Acquired Pneumonia Patient presented following mechanical fall 2/2 weakness and shortness of breath, with notable fever and O2 sat of 87% preceding hospital arrival; CXR and clinical presentation congruent with pneumonia as etiology. Afebrile with normal WBC, tolerating antibiotics; and neurologically stable. Blood culture with no growth.  -Continue Azithromycin 250 mg p.o  -Continue IV Ceftriaxone  -CBC monitoring -Supplemental oxygen if  needed  Rhabdomyolysis Patient has a hx of seizures but denies seizure-like activity preceding event. Likely secondary to fall. Evident per urinalysis and elevated CK at 4,761 on admission--->4, 764 today. Received LR bolus and infusion yesterday. Resolved mild AKI ; Creatinine now at baseline (1.26-->1.03 today).  -Discontinued LR   Mild AKI -likely contribution of myoglobinuria and malnutrition secondary to infection. Received fluids. Resolved mild AKI ; Creatinine now at baseline (1.26-->1.03 today).    Orthostatic hypotension Patient with positive Orthostatic vitals with three minute standing, and endorsing intermittent lightheadedness with quick standing. Fluids were administered.  -Holding home amlodipine -PT to evaluate  HTN Patient has hx of HTN.  -continue home Metoprolol 25mg  -Resume home amlodipine at D'c   Chronic Anemia Patient has a hx of chronic normocytic anemia; takes home ferrous sulfate. Denied symptoms of GI blood loss. Last colonoscopy in 2014 (sessile polyps that were removed). Mild microcytic anemia with hgb of 12.1, low iron, low-normal TIBC, and ferritin elevated at 1,284 likely due to an acute inflammatory reaction in the context of his pneumonia.  -Trend CBC -home ferrous sulfate 325mg   Hx of Seizure -Continue Keppra home med -Seizure precautions    LOS: 1 day   , Medical Student 08/14/2020, 10:11 AM

## 2020-08-14 NOTE — Evaluation (Signed)
Occupational Therapy Evaluation Patient Details Name: Mark Soto MRN: [id number] DOB: 1941/02/27 Today's Date: 08/14/2020    History of Present Illness 80 y/o M with medical Hx of Dementia, CVA, seizures, HTN, HLD, DM2, Hx of latent syphilis, who brought by EMS from SNF due to fever, hypoxia and after found lying on the floor. Brought to ED 08/13/20 and admitted for treatment of syncope/fall CXR not indicative of PNA   Clinical Impression   Pt admitted with the above diagnoses and presents with below problem list. Pt will benefit from continued acute OT to address the below listed deficits and maximize independence with basic ADLs prior to d/c to venue below. Unclear what pt's level of assist was PTA but suspect he was at least mod I for any OOB/LB ADLs. He reports not needing assist with bathing dressing at baseline but is not a fully reliable historian. Utilized rw this session with consistent verbal and at times tactile cueing for sequencing and technique with rw. Up in recliner at end of session with chair alarm on and call bell in reach.      Follow Up Recommendations  Home health OT;Supervision/Assistance - 24 hour    Equipment Recommendations  3 in 1 bedside commode (to use over regular height toilet. unsure if pt has elevated toilet.)    Recommendations for Other Services       Precautions / Restrictions Precautions Precautions: Fall Precaution Comments: fall/syncope prior to hospitalization Restrictions Weight Bearing Restrictions: No      Mobility Bed Mobility                    Transfers Overall transfer level: Needs assistance Equipment used: Rolling walker (2 wheeled) Transfers: Sit to/from Stand Sit to Stand: Min guard         General transfer comment: min guard for safety. to/from recliner    Balance Overall balance assessment: Needs assistance Sitting-balance support: Feet supported;No upper extremity supported Sitting balance-Leahy Scale:  Fair     Standing balance support: Bilateral upper extremity supported;Single extremity supported Standing balance-Leahy Scale: Poor Standing balance comment: requires at least single UE support for safe balance                           ADL either performed or assessed with clinical judgement   ADL                                               Vision         Perception     Praxis      Pertinent Vitals/Pain Pain Assessment: No/denies pain     Hand Dominance Right   Extremity/Trunk Assessment Upper Extremity Assessment Upper Extremity Assessment: Generalized weakness   Lower Extremity Assessment Lower Extremity Assessment: Defer to PT evaluation       Communication     Cognition Arousal/Alertness: Awake/alert Behavior During Therapy: Flat affect;Impulsive Overall Cognitive Status: History of cognitive impairments - at baseline Area of Impairment: Memory;Following commands;Safety/judgement;Awareness;Attention;Problem solving;Orientation                 Orientation Level: Time Current Attention Level: Selective Memory: Decreased short-term memory Following Commands: Follows multi-step commands with increased time;Follows multi-step commands inconsistently;Follows one step commands inconsistently Safety/Judgement: Decreased awareness of safety;Decreased awareness of deficits Awareness: Emergent Problem Solving: Slow processing;Difficulty sequencing;Requires  verbal cues;Requires tactile cues General Comments: h/o dementia at baseline. difficulty sequencing movements with rw. Poor safety awareness and limited insight into deficits. plesantly confused.   General Comments  VSS on RA    Exercises     Shoulder Instructions      Home Living Family/patient expects to be discharged to:: Assisted living                             Home Equipment: Dan Humphreys - 2 wheels   Additional Comments: From [facility]       Prior Functioning/Environment Level of Independence: Independent;Needs assistance  Gait / Transfers Assistance Needed: reports independence, however requires UE support on furniture to safely advance mobility ADL's / Homemaking Assistance Needed: meals provided, has room mate, not clear on whether he recieves assist with bathing   Comments: pt not a good historian,        OT Problem List: Decreased strength;Decreased activity tolerance;Impaired balance (sitting and/or standing);Decreased cognition;Decreased safety awareness;Decreased knowledge of use of DME or AE;Decreased knowledge of precautions      OT Treatment/Interventions: Self-care/ADL training;Therapeutic exercise;DME and/or AE instruction;Therapeutic activities;Patient/family education;Balance training    OT Goals(Current goals can be found in the care plan section) Acute Rehab OT Goals Patient Stated Goal: go back to his apartment OT Goal Formulation: With patient Time For Goal Achievement: 08/28/20 Potential to Achieve Goals: Good ADL Goals Pt Will Perform Grooming: with modified independence;standing Pt Will Perform Lower Body Bathing: with modified independence;sit to/from stand Pt Will Perform Lower Body Dressing: with modified independence;sit to/from stand Pt Will Transfer to Toilet: with modified independence;ambulating Pt Will Perform Toileting - Clothing Manipulation and hygiene: with modified independence;sit to/from stand;sitting/lateral leans  OT Frequency: Min 2X/week   Barriers to D/C:            Co-evaluation              AM-PAC OT "6 Clicks" Daily Activity     Outcome Measure Help from another person eating meals?: A Little Help from another person taking care of personal grooming?: A Little Help from another person toileting, which includes using toliet, bedpan, or urinal?: A Little Help from another person bathing (including washing, rinsing, drying)?: A Little Help from another person to  put on and taking off regular upper body clothing?: A Little Help from another person to put on and taking off regular lower body clothing?: A Little 6 Click Score: 18   End of Session Equipment Utilized During Treatment: Rolling walker  Activity Tolerance: Patient tolerated treatment well Patient left: in chair;with call bell/phone within reach;with chair alarm set  OT Visit Diagnosis: Unsteadiness on feet (R26.81);History of falling (Z91.81);Muscle weakness (generalized) (M62.81);Other symptoms and signs involving cognitive function                Time: 2595-6387 OT Time Calculation (min): 12 min Charges:  OT General Charges $OT Visit: 1 Visit OT Evaluation $OT Eval Low Complexity: 1 Low  Raynald Kemp, OT Acute Rehabilitation Services Pager: [phone] Office: [phone]   Pilar Grammes 08/14/2020, 1:36 PM

## 2020-08-14 NOTE — Evaluation (Signed)
Clinical/Bedside Swallow Evaluation Patient Details  Name: Mark Soto MRN: [id number] Date of Birth: Oct 26, 1940  Today's Date: 08/14/2020 Time: SLP Start Time (ACUTE ONLY): [id number] SLP Stop Time (ACUTE ONLY): 0921 SLP Time Calculation (min) (ACUTE ONLY): 16 min  Past Medical History:  Past Medical History:  Diagnosis Date  . Abdominal wall hernia   . Acute left PCA stroke ([place]) 06/13/2014  . Anemia   . BPH (benign prostatic hyperplasia)   . Glucose intolerance (impaired glucose tolerance)   . Hyperlipidemia   . Hypertension   . 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  . Personal history of colonic adenomas 04/14/2013  . Seizures ([place]) 06/13/2014  . Tinnitus    Right Ear   Past Surgical History:  Past Surgical History:  Procedure Laterality Date  . LOOP RECORDER IMPLANT N/A 06/19/2014   Procedure: LOOP RECORDER IMPLANT;  Surgeon: Evans Lance, MD;  Location[facility] CATH LAB;  Service: Cardiovascular;  Laterality: N/A;  . none    . TEE WITHOUT CARDIOVERSION N/A 06/15/2014   Procedure: TRANSESOPHAGEAL ECHOCARDIOGRAM (TEE);  Surgeon: Fay Records, MD;  Location: [facility] ENDOSCOPY;  Service: Cardiovascular;  Laterality: N/A;   HPI:  Pt is a 80 yo male admitted from SNF after fall with fever and hypoxia. Differential dx includes PNA. CXR showed mild bibasilar atelectasis with mild infiltrates not excluded. CTH without acute changes. Previous BSEs recommended Dys 2 to regular solids and thin liquids (depending on availability of dentures). PMH includes: dementia, seizures, HTN, HLD, L PCA CVA, DM2   Assessment / Plan / Recommendation Clinical Impression  Pt showed no overt s/s of dysphagia nor aspiration across apprximately 8 oz of water, 4 oz of applesauce, and a package of graham crackers. He has no subjective c/o dysphagia and denies any h/o PNA. He did cough x1 when trying to take two pills at a time with a thin liquid wash, and said that this happens occasionally when he does  "too fast." Recommend starting a regular diet and thin liquids with meds given one at a time. SLP will f/u briefly. SLP Visit Diagnosis: Dysphagia, unspecified (R13.10)    Aspiration Risk  Mild aspiration risk    Diet Recommendation Regular;Thin liquid   Liquid Administration via: Cup;Straw Medication Administration: Whole meds with liquid (one at a time) Supervision: Patient able to self feed Compensations: Slow rate;Small sips/bites Postural Changes: Seated upright at 90 degrees    Other  Recommendations Oral Care Recommendations: Oral care BID   Follow up Recommendations None      Frequency and Duration min 1 x/week  1 week       Prognosis Prognosis for Safe Diet Advancement: Good      Swallow Study   General HPI: Pt is a 80 yo male admitted from SNF after fall with fever and hypoxia. Differential dx includes PNA. CXR showed mild bibasilar atelectasis with mild infiltrates not excluded. CTH without acute changes. Previous BSEs recommended Dys 2 to regular solids and thin liquids (depending on availability of dentures). PMH includes: dementia, seizures, HTN, HLD, L PCA CVA, DM2 Type of Study: Bedside Swallow Evaluation Previous Swallow Assessment: see HPI Diet Prior to this Study: NPO Temperature Spikes Noted: Yes (100.3) Respiratory Status: Room air History of Recent Intubation: No Behavior/Cognition: Alert;Cooperative;Pleasant mood Oral Cavity Assessment: Within Functional Limits Oral Care Completed by SLP: No Oral Cavity - Dentition: Dentures, top;Dentures, bottom (full dentures on top, partials on bottom) Vision: Functional for self-feeding Self-Feeding Abilities: Able to feed self  Patient Positioning: Upright in bed Baseline Vocal Quality: Normal Volitional Swallow: Able to elicit    Oral/Motor/Sensory Function Overall Oral Motor/Sensory Function: Within functional limits   Ice Chips Ice chips: Not tested   Thin Liquid Thin Liquid: Impaired Presentation:  Cup;Self Fed;Straw Pharyngeal  Phase Impairments: Cough - Immediate (when trying to take 2 pills)    Nectar Thick Nectar Thick Liquid: Not tested   Honey Thick Honey Thick Liquid: Not tested   Puree Puree: Within functional limits Presentation: Self Fed;Spoon   Solid     Solid: Within functional limits Presentation: Self Fed      Mahala Menghini., M.A. CCC-SLP Acute Rehabilitation Services Pager [phone] Office [phone]  08/14/2020,10:34 AM

## 2020-08-14 NOTE — Progress Notes (Addendum)
   08/14/20 0550  Urine Characteristics  Urinary Interventions Bladder scan  Bladder Scan Volume (mL) 256 mL  Patient has not voided since arrival from the ER last night, patient given a urinal but did not urinate, patient stated I'm ok. No sign of distress noted will continue to monitor and report to oncoming RN

## 2020-08-14 NOTE — TOC Initial Note (Signed)
Transition of Care [facility]) - Initial/Assessment Note    Patient Details  Name: Mark Soto MRN: [id number] Date of Birth: 03/24/1941  Transition [facility]) CM/SW Contact:    Eduard Roux, LCSWA Phone Number: 08/14/2020, 10:25 AM  Clinical Narrative:                  CSW spoke with patient's daughter Judd Lien. CSW introduced self and explained role. Mariam states the patient is from [facility]. CSW advised, will follow patient and assist with discharge planning when medically stable.   Antony Blackbird, MSW, LCSW Clinical Social Worker    Barriers to Discharge: Continued Medical Work up   Patient Goals and CMS Choice        Expected Discharge Plan and Services   In-house Referral: Clinical Social Work     Living arrangements for the past 2 months: Assisted Living Facility                                      Prior Living Arrangements/Services Living arrangements for the past 2 months: Assisted Living Facility Lives with:: Self,Facility Resident                   Activities of Daily Living      Permission Sought/Granted Permission sought to share information with : Family Supports    Share Information with NAME: Dixie Jafri     Permission granted to share info w Relationship: daughter  Permission granted to share info w Contact Information: [phone]  Emotional Assessment       Orientation: : Oriented to Self,Oriented to Place Alcohol / Substance Use: Not Applicable Psych Involvement: No (comment)  Admission diagnosis:  PNA (pneumonia) [J18.9] Cardiac arrhythmia, unspecified cardiac arrhythmia type [I49.9] Community acquired pneumonia, unspecified laterality [J18.9] Patient Active Problem List   Diagnosis Date Noted  . PNA (pneumonia) 08/13/2020  . Fever 08/13/2020  . Fall 08/13/2020  . 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  . Dyslipidemia associated with type 2 diabetes  mellitus (HCC) 07/22/2015  . Benign essential HTN   . CAP (community acquired pneumonia) 07/11/2015  . High anion gap metabolic acidosis 07/11/2015  . Aspiration pneumonia (HCC)   . Type 2 diabetes mellitus without complication (HCC) 03/11/2015  . History of CVA (cerebrovascular accident) 09/11/2014  . Obesity (BMI 30.0-34.9) 09/11/2014  . Seizures (HCC) 09/04/2014  . Syphilis, latent 09/04/2014  . Essential hypertension 09/04/2014  . Glucose intolerance (impaired glucose tolerance) 09/04/2014  . Vascular dementia (HCC) 09/04/2014  . Essential hypertension, benign 06/25/2014  . BPH (benign prostatic hyperplasia) 06/25/2014  . Late latent syphilis   . Syphilis in male 06/15/2014  . HLD (hyperlipidemia)   . PFO (patent foramen ovale)   . Cerebral embolism with cerebral infarction (HCC) 06/14/2014  . Cryptogenic stroke (HCC)   . Seizure (HCC) 06/13/2014  . Altered mental status   . Dementia (HCC)   . History of colonic polyps 04/14/2013   PCP:  Galvin Proffer, MD Pharmacy:   [facility] - [place], [place] - [address] [place] [address] Phone: [phone] Fax: [phone]     Social Determinants of Health (SDOH) Interventions    Readmission Risk Interventions No flowsheet data found.

## 2020-08-14 NOTE — Evaluation (Signed)
Physical Therapy Evaluation Patient Details Name: Mark Soto MRN: [id number] DOB: 02/03/1941 Today's Date: 08/14/2020   History of Present Illness  80 y/o M with medical Hx of Dementia, CVA, seizures, HTN, HLD, DM2, Hx of latent syphilis, who brought by EMS from SNF due to fever, hypoxia and after found lying on the floor. Brought to ED 08/13/20 and admitted for treatment of syncope/fall CXR not indicative of PNA  Clinical Impression  PTA pt living in ALF with room mate. Pt reports he does not use a RW but that his room mate does. Pt feels he does not need it. Unclear amount of support provided by facility. Pt in bathroom on entry and exhibiting poor safety as he had urinated on floor and tried to dry up with his sock and laundry bag. With maximal encouragement for RW use pt utilized to walk to sink to wash stool off his hands. Requires maximal cuing for cleaning. At baseline it is obvious that pt utilizes furniture and UE support for mobility. Attempted to instruct pt in RW usage and requires maximal multimodal cuing for proximity to RW, posture and decreased speed for improved balance. Pt is likely very near his baseline level of function, however would benefit from HHPT to work on gait training with RW to improve stability. PT will continue to follow acutely.     Follow Up Recommendations Home health PT;Supervision/Assistance - 24 hour    Equipment Recommendations  Rolling walker with 5" wheels    Recommendations for Other Services       Precautions / Restrictions Precautions Precautions: Fall Precaution Comments: fall/syncope prior to hospitalization Restrictions Weight Bearing Restrictions: No      Mobility  Bed Mobility                    Transfers Overall transfer level: Needs assistance   Transfers: Sit to/from Stand Sit to Stand: Min guard         General transfer comment: min guard for safety, increased use of grab bar beside  toilet  Ambulation/Gait Ambulation/Gait assistance: Min assist Gait Distance (Feet): 15 Feet Assistive device: Rolling walker (2 wheeled) Gait Pattern/deviations: Step-through pattern;Trunk flexed;Shuffle;Decreased step length - right;Decreased step length - left Gait velocity: to fast for conditions Gait velocity interpretation: <1.31 ft/sec, indicative of household ambulator General Gait Details: min A for steadying with RW, pt has no awareness of walker useage and requires maximal multimodal cuing for safe use and keeping close proximity, just keeps saying "yes ma'am"         Balance Overall balance assessment: Needs assistance Sitting-balance support: Feet supported;No upper extremity supported Sitting balance-Leahy Scale: Fair     Standing balance support: Single extremity supported;Bilateral upper extremity supported;During functional activity Standing balance-Leahy Scale: Poor Standing balance comment: requires at least single UE support for safe balance                             Pertinent Vitals/Pain Pain Assessment: No/denies pain    Home Living Family/patient expects to be discharged to:: Assisted living               Home Equipment: Gilford Rile - 2 wheels Additional Comments: From [facility]    Prior Function Level of Independence: Independent;Needs assistance   Gait / Transfers Assistance Needed: reports independence, however requires UE support on furniture to safely advance mobility  ADL's / Homemaking Assistance Needed: meals provided, has room mate, not clear on  whether he recieves assist with bathing  Comments: pt not a good historian,     Hand Dominance   Dominant Hand: Right    Extremity/Trunk Assessment   Upper Extremity Assessment Upper Extremity Assessment: Defer to OT evaluation    Lower Extremity Assessment Lower Extremity Assessment: Generalized weakness;Difficult to assess due to impaired cognition        Communication      Cognition Arousal/Alertness: Awake/alert Behavior During Therapy: Impulsive;Restless Overall Cognitive Status: History of cognitive impairments - at baseline Area of Impairment: Memory;Following commands;Safety/judgement;Awareness;Attention;Problem solving;Orientation                 Orientation Level: Time Current Attention Level: Selective Memory: Decreased short-term memory Following Commands: Follows multi-step commands with increased time;Follows multi-step commands inconsistently;Follows one step commands inconsistently Safety/Judgement: Decreased awareness of safety;Decreased awareness of deficits Awareness: Emergent Problem Solving: Slow processing;Difficulty sequencing;Requires verbal cues;Requires tactile cues General Comments: pt with very poor safety awareness and problem solving, urinated all over floor trying to clean up with socks and laundry bag despite being offered a towel, unaware of stool on his hand needs maximal cuing for washing hands      General Comments General comments (skin integrity, edema, etc.): VSS on RA        Assessment/Plan    PT Assessment Patient needs continued PT services  PT Problem List Decreased activity tolerance;Decreased balance;Decreased mobility;Decreased safety awareness;Decreased knowledge of use of DME       PT Treatment Interventions DME instruction;Gait training;Stair training;Therapeutic activities;Functional mobility training;Therapeutic exercise;Balance training;Cognitive remediation;Patient/family education    PT Goals (Current goals can be found in the Care Plan section)  Acute Rehab PT Goals Patient Stated Goal: go back to his apartment PT Goal Formulation: Patient unable to participate in goal setting Time For Goal Achievement: 08/28/20 Potential to Achieve Goals: Fair    Frequency Min 3X/week   Barriers to discharge        Co-evaluation               AM-PAC PT "6 Clicks"  Mobility  Outcome Measure Help needed turning from your back to your side while in a flat bed without using bedrails?: None Help needed moving from lying on your back to sitting on the side of a flat bed without using bedrails?: None Help needed moving to and from a bed to a chair (including a wheelchair)?: A Little Help needed standing up from a chair using your arms (e.g., wheelchair or bedside chair)?: A Little Help needed to walk in hospital room?: A Little Help needed climbing 3-5 steps with a railing? : A Little 6 Click Score: 20    End of Session   Activity Tolerance: Patient tolerated treatment well Patient left: in chair;with call bell/phone within reach;with chair alarm set;Other (comment) (on phone) Nurse Communication: Mobility status PT Visit Diagnosis: Unsteadiness on feet (R26.81);Other abnormalities of gait and mobility (R26.89);Muscle weakness (generalized) (M62.81);History of falling (Z91.81)    Time: 1115-1150 PT Time Calculation (min) (ACUTE ONLY): 35 min   Charges:   PT Evaluation $PT Eval Moderate Complexity: 1 Mod PT Treatments $Gait Training: 8-22 mins        Pebbles Zeiders B. Beverely Risen PT, DPT Acute Rehabilitation Services Pager [phone] Office [phone]   Elon Alas Fleet 08/14/2020, 12:13 PM

## 2020-08-14 NOTE — Progress Notes (Signed)
OT Cancellation Note  Patient Details Name: Daymion Nazaire MRN: [id number] DOB: 1940/10/21   Cancelled Treatment:    Reason Eval/Treat Not Completed: Other (comment) (with SLP). Plan to reattempt in a bit.  Raynald Kemp, OT Acute Rehabilitation Services Pager: [phone] Office: [phone]  08/14/2020, 9:17 AM

## 2020-08-15 ENCOUNTER — Inpatient Hospital Stay (HOSPITAL_COMMUNITY): Payer: Medicare HMO

## 2020-08-15 DIAGNOSIS — I10 Essential (primary) hypertension: Secondary | ICD-10-CM | POA: Diagnosis not present

## 2020-08-15 DIAGNOSIS — R55 Syncope and collapse: Secondary | ICD-10-CM | POA: Diagnosis not present

## 2020-08-15 DIAGNOSIS — M6282 Rhabdomyolysis: Secondary | ICD-10-CM | POA: Diagnosis not present

## 2020-08-15 DIAGNOSIS — I361 Nonrheumatic tricuspid (valve) insufficiency: Secondary | ICD-10-CM

## 2020-08-15 DIAGNOSIS — J189 Pneumonia, unspecified organism: Secondary | ICD-10-CM | POA: Diagnosis not present

## 2020-08-15 LAB — CBC
HCT: 35.8 % — ABNORMAL LOW (ref 39.0–52.0)
Hemoglobin: 11.7 g/dL — ABNORMAL LOW (ref 13.0–17.0)
MCH: 24.5 pg — ABNORMAL LOW (ref 26.0–34.0)
MCHC: 32.7 g/dL (ref 30.0–36.0)
MCV: 75.1 fL — ABNORMAL LOW (ref 80.0–100.0)
Platelets: 124 10*3/uL — ABNORMAL LOW (ref 150–400)
RBC: 4.77 MIL/uL (ref 4.22–5.81)
RDW: 16.6 % — ABNORMAL HIGH (ref 11.5–15.5)
WBC: 5.4 10*3/uL (ref 4.0–10.5)
nRBC: 0 % (ref 0.0–0.2)

## 2020-08-15 LAB — COMPREHENSIVE METABOLIC PANEL
ALT: 30 U/L (ref 0–44)
AST: 99 U/L — ABNORMAL HIGH (ref 15–41)
Albumin: 2.7 g/dL — ABNORMAL LOW (ref 3.5–5.0)
Alkaline Phosphatase: 54 U/L (ref 38–126)
Anion gap: 10 (ref 5–15)
BUN: 20 mg/dL (ref 8–23)
CO2: 21 mmol/L — ABNORMAL LOW (ref 22–32)
Calcium: 7.8 mg/dL — ABNORMAL LOW (ref 8.9–10.3)
Chloride: 102 mmol/L (ref 98–111)
Creatinine, Ser: 1.06 mg/dL (ref 0.61–1.24)
GFR, Estimated: >60 mL/min (ref >60)
Glucose, Bld: 117 mg/dL — ABNORMAL HIGH (ref 70–99)
Potassium: 4.6 mmol/L (ref 3.5–5.1)
Sodium: 133 mmol/L — ABNORMAL LOW (ref 135–145)
Total Bilirubin: 1.4 mg/dL — ABNORMAL HIGH (ref 0.3–1.2)
Total Protein: 6 g/dL — ABNORMAL LOW (ref 6.5–8.1)

## 2020-08-15 LAB — GLUCOSE, CAPILLARY
Glucose-Capillary: 112 mg/dL — ABNORMAL HIGH (ref 70–99)
Glucose-Capillary: 108 mg/dL — ABNORMAL HIGH (ref 70–99)
Glucose-Capillary: 108 mg/dL — ABNORMAL HIGH (ref 70–99)
Glucose-Capillary: 99 mg/dL (ref 70–99)
Glucose-Capillary: 119 mg/dL — ABNORMAL HIGH (ref 70–99)

## 2020-08-15 LAB — ECHOCARDIOGRAM COMPLETE
Area-P 1/2: 3.03 cm2
Calc EF: 52.8 %
Height: 72 in
S' Lateral: 2.6 cm
Single Plane A2C EF: 54.4 %
Single Plane A4C EF: 49.8 %
Weight: 3707.26 oz

## 2020-08-15 LAB — CK: Total CK: 3124 U/L — ABNORMAL HIGH (ref 49–397)

## 2020-08-15 LAB — SARS CORONAVIRUS 2 (TAT 6-24 HRS): SARS Coronavirus 2: NEGATIVE

## 2020-08-15 MED ORDER — TAMSULOSIN HCL 0.4 MG PO CAPS: 0.4000 mg | ORAL_CAPSULE | Freq: Every day | ORAL | Status: DC

## 2020-08-15 MED ORDER — TAMSULOSIN HCL 0.4 MG PO CAPS
0.4000 mg | ORAL_CAPSULE | Freq: Every day | ORAL | Status: DC
Start: 2020-08-15 — End: 2020-08-16
  Administered 2020-08-15 – 2020-08-16 (×2): 0.4 mg via ORAL
  Filled 2020-08-15 (×2): qty 1

## 2020-08-15 MED ORDER — CEFDINIR 300 MG PO CAPS: 300.0000 mg | ORAL_CAPSULE | Freq: Two times a day (BID) | ORAL | 0 refills | Status: DC

## 2020-08-15 MED ORDER — CEFDINIR 300 MG PO CAPS
300.0000 mg | ORAL_CAPSULE | Freq: Two times a day (BID) | ORAL | Status: DC
  Administered 2020-08-15: 300 mg via ORAL
  Filled 2020-08-15: qty 1

## 2020-08-15 MED ORDER — AZITHROMYCIN 250 MG PO TABS: 250.0000 mg | ORAL_TABLET | Freq: Every day | ORAL | 0 refills | Status: DC

## 2020-08-15 MED ORDER — CEFDINIR 300 MG PO CAPS
300.0000 mg | ORAL_CAPSULE | Freq: Two times a day (BID) | ORAL | Status: DC
  Administered 2020-08-15 – 2020-08-16 (×2): 300 mg via ORAL
  Filled 2020-08-15 (×2): qty 1

## 2020-08-15 NOTE — TOC Progression Note (Addendum)
Transition of Care [facility]) - Progression Note    Patient Details  Name: Honest Vanleer MRN: [id number] Date of Birth: [birth year]  Transition of Care [facility]) CM/SW Contact  Bess Kinds, RN Phone Number: [phone] 08/15/2020, 11:43 AM  Clinical Narrative:     Notified by MD of patient's readiness to transition back to [facility]. Spoke with Lurena Joiner at [facility] [phone]. Lurena Joiner requested that signed FL2 and DC summary be faxed to [phone]. Marland Kitchen   Patient will need new covid test. Advised that patient was negative 1/4, [facility] requesting one today.  [facility] uses Event organ[employer] for [employer] PT and has additional agency for [employer]. Requested [facility] PT/OT orders. MD made aware.   Patient does not have RW at ALF - MD notified for order. Velna Hatchet with [employer] notified of DME need for delivery to bedside.   Patient will need transportation if he is able to discharge today before 5 pm, or [facility] can arrange pick up tomorrow.   TOC following for transition needs.   1200: Spoke with patient's daughter, Pieter Partridge, to discuss transition plans.   Barriers to Discharge: Continued Medical Work up  Expected Discharge Plan and Services   In-house Referral: Clinical Social Work     Living arrangements for the past 2 months: Assisted Living Facility                                       Social Determinants of Health (SDOH) Interventions    Readmission Risk Interventions No flowsheet data found.

## 2020-08-15 NOTE — Progress Notes (Signed)
Admission note: Pt transfered Arrival Method: from 4 east via stretcher  Mental Orientation: A&O x 2 Telemetry: Yes, box 20 applied Pain: No pain  Tubes: N/A  Safety Measures:  discussed the Fall Prevention   Admission:   44M Orientation: Patient has been oriented to the unit, staff and to the room.  Family: none at bedside

## 2020-08-15 NOTE — Discharge Summary (Addendum)
Name: Mark Soto MRN: [id number] DOB: [birth year] 80 y.o. PCP: Galvin Proffer, MD  Date of Admission: 08/13/2020  6:29 AM Date of Discharge:  08/16/20 Attending Physician: Tyson Alias, *   Discharge Diagnosis: 1. Community acquired pneumonia 2. Mild rhabdomyolysis 3. Acute kidney injury 4. Orthostatic hypotension  Discharge Medications: Allergies as of 08/15/2020   No Known Allergies     Medication List    STOP taking these medications   amLODipine 10 MG tablet Commonly known as: NORVASC   amLODipine 5 MG tablet Commonly known as: NORVASC   hydrALAZINE 25 MG tablet Commonly known as: APRESOLINE   lisinopril 10 MG tablet Commonly known as: ZESTRIL     TAKE these medications   aspirin EC 81 MG tablet Take 81 mg by mouth daily. Swallow whole. What changed: Another medication with the same name was removed. Continue taking this medication, and follow the directions you see here.   atorvastatin 10 MG tablet Commonly known as: LIPITOR Take 10 mg by mouth daily. What changed: Another medication with the same name was removed. Continue taking this medication, and follow the directions you see here.   azithromycin 250 MG tablet Commonly known as: ZITHROMAX Take 1 tablet (250 mg total) by mouth daily. Start taking on: August 16, 2020   cefdinir 300 MG capsule Commonly known as: OMNICEF Take 1 capsule (300 mg total) by mouth every 12 (twelve) hours.   donepezil 10 MG tablet Commonly known as: ARICEPT Take 10 mg by mouth daily.   ferrous sulfate 325 (65 FE) MG tablet Take 1 tablet (325 mg total) by mouth daily with breakfast.   guaiFENesin 100 MG/5ML liquid Commonly known as: ROBITUSSIN Take 10 mLs by mouth every 4 (four) hours as needed for cough.   levETIRAcetam 500 MG tablet Commonly known as: KEPPRA Take 1 tablet (500 mg total) by mouth 2 (two) times daily.   metFORMIN 500 MG tablet Commonly known as: GLUCOPHAGE Take 1 tablet (500 mg total)  by mouth daily with breakfast.   metoprolol tartrate 25 MG tablet Commonly known as: LOPRESSOR Take 1 tablet (25 mg total) by mouth 2 (two) times daily.   sertraline 100 MG tablet Commonly known as: ZOLOFT Take 100 mg by mouth daily.   tamsulosin 0.4 MG Caps capsule Commonly known as: FLOMAX Take 1 capsule (0.4 mg total) by mouth daily after breakfast.            Durable Medical Equipment  (From admission, onward)         Start     Ordered   08/15/20 1116  For home use only DME Walker rolling  Once       Question Answer Comment  Walker: With 5 Inch Wheels   Patient needs a walker to treat with the following condition Dementia (HCC)   Patient needs a walker to treat with the following condition Physical deconditioning      08/15/20 1118          Disposition and follow-up:   [name] was discharged from [facility] in Stable condition.  At the hospital follow up visit please address:  1.  Follow up: Marland Kitchen Community acquired pneumonia - treated with 5d of ABx, ensure resolution of symptoms . Rhabdomyolysis - AKI resolved, mild elevations in ALT and bilirubin, ensure resolution . Orthostatic hypotension and Essential hypertension - BP meds stopped for orthostasis, resume as needed  2.  Labs / imaging needed at time of follow-up: CMP, CBC  3.  Pending labs/ test needing follow-up: blood culture  Follow-up Appointments:  Contact information for after-discharge care    [facility] .   Service: Assisted Living Contact information: [place] [facility] Course by problem list:  Syncope 2/2 Community Acquired Pneumonia Patient febrile to 101, and hypoxic at 87 on EMS arrival following, extended time on floor after fall. Improved respiration on 4L supplemental oxygen. Given IV Ceftriaxone, and Azithromycin. Tmax of 100.3 during course  of hospitalization, with normal WBC. Chest xray showed borderline cardiomegaly without notable pulmonary venous congestion; and mild bibasilar atelectasis vs infiltrates.  Blood culture without growth to date. On day of discharge, patient felt well and denies shortness of breath. Patient to transition from IV Ceftriaxone to Cefdinir 300 mg bid, and Azithromycin 250 mg daily for 3 days.  Workup for syncope also includes TTE which was not contributory. Telemetry not concerning for arrhythmia. CT head without neurologic etiology. Patient was orthostatic on admission and on day of discharge, see below.   Orthostatic hypotension Patient reports long-standing intermittent lightheadedness on standing preceding admission. Positive orthostatic vitals with three minute standing, and endorsing lightheadedness with quick standing. Fluids were given, and home amlodipine held. Evaluated by PT, with impression of baseline proximity, though would benefit from gait training with Home health PT, and rolling walker for assistance. Patient amlodipine resumed in consideration of his HTN. Repeat vital signs remained orthostatic, so his BP meds were held on discharge. May resume as needed. Note that his Flomax was restarted for urinary retention.  Urinary Retention Patient on home Flomax. This was initially held in light of his orthostatic hypotension. Urinary retention per bladder scan, requiring in and out catheter. Flomax resumed, with appropriate urine output following.   Rhabdomyolysis AKI Mild Transamanitis and hyperbilirubinemia Patient has a hx of seizures but denies seizure-like activity preceding event. Mild Rhabdomyolysis likely secondary to fall and extended stay on ground. Evident per urinalysis and elevated CK at 4,761 on admission--->3,124 at discharge. He received LR bolus and infusion. Creatinine mildly elevated at 1.26 on admission, improved to 1.06 which is near his baseline. Also had mild bilirubin  elevation to 1.4 and mild AST elevation to 99, suspect related to rhabdo. F/u outpatient to ensure resolution.  Discharge Vitals:   BP 133/77   Pulse (!) 57   Temp 98.1 F (36.7 C) (Oral)   Resp 20   Ht 6' (1.829 m)   Wt 105.1 kg   SpO2 97%   BMI 31.42 kg/m   Pertinent Labs, Studies, and Procedures:  CT Head Wo Contrast  Result Date: 08/13/2020 CLINICAL DATA:  Fall EXAM: CT HEAD WITHOUT CONTRAST CT CERVICAL SPINE WITHOUT CONTRAST TECHNIQUE: Multidetector CT imaging of the head and cervical spine was performed following the standard protocol without intravenous contrast. Multiplanar CT image reconstructions of the cervical spine were also generated. COMPARISON:  None. FINDINGS: CT HEAD FINDINGS Brain: Generalized atrophy. Moderate white matter changes with patchy and confluent white matter hypodensity bilaterally. Chronic infarct left occipital lobe. Negative for acute infarct, hemorrhage, mass. Vascular: Negative for hyperdense vessel Skull: Negative Sinuses/Orbits: Sinus mucosal disease.  Negative orbit Other: None CT CERVICAL SPINE FINDINGS Alignment: Normal Skull base and vertebrae: Negative for fracture Soft tissues and spinal canal: Negative for soft tissue swelling or mass. Disc levels: Mild disc degeneration and spurring at C4-5 and  C5-6. No significant stenosis. Upper chest: Lung apices clear bilaterally Other: None IMPRESSION: 1. No acute intracranial abnormality. Atrophy and chronic ischemic changes as above 2. Negative for cervical spine fracture. Electronically Signed   By: Franchot Gallo M.D.   On: 08/13/2020 07:39   CT Cervical Spine Wo Contrast  Result Date: 08/13/2020 CLINICAL DATA:  Fall EXAM: CT HEAD WITHOUT CONTRAST CT CERVICAL SPINE WITHOUT CONTRAST TECHNIQUE: Multidetector CT imaging of the head and cervical spine was performed following the standard protocol without intravenous contrast. Multiplanar CT image reconstructions of the cervical spine were also generated.  COMPARISON:  None. FINDINGS: CT HEAD FINDINGS Brain: Generalized atrophy. Moderate white matter changes with patchy and confluent white matter hypodensity bilaterally. Chronic infarct left occipital lobe. Negative for acute infarct, hemorrhage, mass. Vascular: Negative for hyperdense vessel Skull: Negative Sinuses/Orbits: Sinus mucosal disease.  Negative orbit Other: None CT CERVICAL SPINE FINDINGS Alignment: Normal Skull base and vertebrae: Negative for fracture Soft tissues and spinal canal: Negative for soft tissue swelling or mass. Disc levels: Mild disc degeneration and spurring at C4-5 and C5-6. No significant stenosis. Upper chest: Lung apices clear bilaterally Other: None IMPRESSION: 1. No acute intracranial abnormality. Atrophy and chronic ischemic changes as above 2. Negative for cervical spine fracture. Electronically Signed   By: Franchot Gallo M.D.   On: 08/13/2020 07:39   DG Chest Port 1 View  Result Date: 08/13/2020 CLINICAL DATA:  Questionable sepsis. EXAM: PORTABLE CHEST 1 VIEW COMPARISON:  07/11/2015. FINDINGS: Mediastinum and hilar structures normal. Cardiac monitoring device noted. Borderline cardiomegaly. No pulmonary venous congestion. Low lung volumes. Mild bibasilar atelectasis. Mild bibasilar infiltrates cannot be excluded. No pleural effusion or pneumothorax. IMPRESSION: 1. Borderline cardiomegaly.  No pulmonary venous congestion. 2. Low lung volumes with mild bibasilar atelectasis. Mild bibasilar infiltrates cannot be excluded. Electronically Signed   By: Marcello Moores  Register   On: 08/13/2020 07:08   Recent Labs    08/13/20 1044  CULT NO GROWTH Performed at [facility], [place] [address] [place], [place] [address]    Recent Labs    08/13/20 [phone] 08/13/20 2102 08/14/20 0110 08/14/20 0721 08/14/20 1849 08/15/20 0105  CKTOTAL  --    < >  --  4,764* 3,906* 3,124*  CREATININE 1.26*  --  1.03  --   --  1.06  ALT 28  --  29  --   --  30  BILITOT 0.8  --  0.6  --   --  1.4*    < > = values in this interval not displayed.      Discharge Instructions: Discharge Instructions    Call MD for:  extreme fatigue   Complete by: As directed    Call MD for:  temperature >100.4   Complete by: As directed    Diet - low sodium heart healthy   Complete by: As directed    Discharge instructions   Complete by: As directed    [name], it has been a pleasure taking care of you.  We treated you for pneumonia, and are discharging you with a few days of antibiotics.  We also found that your blood pressure would drop when he stood up.  This is referred to as orthostatic hypotension.  Stopping your blood pressure medications because of this, they can be restarted as needed by your primary care doctor.  Your discharge instructions.  1.  Follow-up with your primary care doctor 2.  Continue azithromycin once daily for 2 more days 3.  Continue cefdinir twice a day.  Take 1 tablet tonight and then for 2 more days 4.  Stop your amlodipine, hydralazine, and lisinopril.  These may have made your blood pressure too low.  Your primary care doctor may restart these.   Increase activity slowly   Complete by: As directed       Signed: Andrew Au, MD 08/15/2020, 12:58 PM   Pager: [phone]

## 2020-08-15 NOTE — Progress Notes (Signed)
Report given to [facility] , pt is transferring to 33M-20.

## 2020-08-15 NOTE — NC FL2 (Deleted)
[place] MEDICAID FL2 LEVEL OF CARE SCREENING TOOL     IDENTIFICATION  Patient Name: Mark Soto Birthdate: 25-Dec-1940 Sex: male Admission Date (Current Location): 08/13/2020  [facility] and [place] Number:  Herbalist and Address:  The [place], [place] [address] [place], [place] [address]      Provider Number: [id number]  Attending Physician Name and Address:  Axel Filler, *  Relative Name and Phone Number:       Current Level of Care: Hospital Recommended Level of Care: [employer] Prior Approval Number:    Date Approved/Denied:   PASRR Number:    Discharge Plan: Other (Comment) ([facility])    Current Diagnoses: Patient Active Problem List   Diagnosis Date Noted  . Rhabdomyolysis 08/14/2020  . PNA (pneumonia) 08/13/2020  . Fall 08/13/2020  . Type II diabetes mellitus with neurological manifestations ([place]) 07/22/2015  . CAP (community acquired pneumonia) 07/11/2015  . History of CVA (cerebrovascular accident) 09/11/2014  . Obesity (BMI 30.0-34.9) 09/11/2014  . Syphilis, latent 09/04/2014  . Vascular dementia ([place]) 09/04/2014  . Essential hypertension, benign 06/25/2014  . BPH (benign prostatic hyperplasia) 06/25/2014  . HLD (hyperlipidemia)   . PFO (patent foramen ovale)   . Seizure ([place]) 06/13/2014  . History of colonic polyps 04/14/2013    Orientation RESPIRATION BLADDER Height & Weight     Self,Place  Normal Continent,Incontinent Weight: 105.1 kg Height:  6' (182.9 cm)  BEHAVIORAL SYMPTOMS/MOOD NEUROLOGICAL BOWEL NUTRITION STATUS      Incontinent Diet (regular, no concentrated sweets)  AMBULATORY STATUS COMMUNICATION OF NEEDS Skin   Limited Assist Verbally Normal                       Personal Care Assistance Level of Assistance  Bathing,Dressing,Feeding Bathing Assistance: Limited assistance Feeding assistance: Limited assistance Dressing Assistance: Limited assistance      Functional Limitations Info  Sight,Hearing,Speech Sight Info: Adequate Hearing Info: Adequate Speech Info: Adequate    SPECIAL CARE FACTORS FREQUENCY  PT (By licensed PT),OT (By licensed OT)     PT Frequency: HH PT to eval and treat OT Frequency: [place] OT to eval and treat            Contractures Contractures Info: Not present    Additional Factors Info  Code Status,Allergies Code Status Info: Full Allergies Info: No known drug allergies           Current Medications (08/15/2020):  This is the current hospital active medication list Current Facility-Administered Medications  Medication Dose Route Frequency Provider Last Rate Last Admin  . aspirin EC tablet 81 mg  81 mg Oral Daily Masoudi, Elhamalsadat, MD   81 mg at 08/15/20 0857  . azithromycin (ZITHROMAX) tablet 250 mg  250 mg Oral Daily Masoudi, Elhamalsadat, MD   250 mg at 08/15/20 0900  . cefdinir (OMNICEF) capsule 300 mg  300 mg Oral Q12H Andrew Au, MD      . enoxaparin (LOVENOX) injection 40 mg  40 mg Subcutaneous Q24H Masoudi, Elhamalsadat, MD   40 mg at 08/14/20 1543  . guaiFENesin (ROBITUSSIN) 100 MG/5ML solution 200 mg  10 mL Oral Q4H PRN Masoudi, Elhamalsadat, MD      . insulin aspart (novoLOG) injection 0-5 Units  0-5 Units Subcutaneous QHS Masoudi, Elhamalsadat, MD      . insulin aspart (novoLOG) injection 0-9 Units  0-9 Units Subcutaneous TID WC Masoudi, Elhamalsadat, MD   1 Units at 08/14/20 1250  .  levETIRAcetam (KEPPRA) tablet 500 mg  500 mg Oral BID Masoudi, Elhamalsadat, MD   500 mg at 08/15/20 0857  . metoprolol tartrate (LOPRESSOR) tablet 25 mg  25 mg Oral BID Masoudi, Elhamalsadat, MD   25 mg at 08/15/20 0857  . tamsulosin (FLOMAX) capsule 0.4 mg  0.4 mg Oral QPC breakfast Remo Lipps, MD   0.4 mg at 08/15/20 0350     Discharge Medications: Please see discharge summary for a list of discharge medications.  Relevant Imaging Results:  Relevant Lab Results:   Additional Information SSN  [id number]  Bess Kinds, RN

## 2020-08-15 NOTE — Progress Notes (Signed)
Pt transferred to 21m-20 per transfer order. Pt stable at the time of transfer.

## 2020-08-15 NOTE — Progress Notes (Signed)
Subjective: ON: Urinary retention per bladder ultrasound; 42mL Cath  Patient doing well with good appetite. He has urinated ~325 mL following catherization. Denies abdominal pain. Endorses chronic history of mild lightheadedness with quick standing.   Objective:  Vital signs in last 24 hours: Vitals:   08/15/20 0112 08/15/20 0330 08/15/20 0823 08/15/20 0857  BP: (!) 154/87 (!) 149/89  (!) 122/53  Pulse: (!) 57 65  67  Resp: 20  16   Temp: 98 F (36.7 C) 98.3 F (36.8 C) 97.7 F (36.5 C)   TempSrc: Oral Oral Oral   SpO2: 100% 100% 95%   Weight:      Height:       Weight change:   Intake/Output Summary (Last 24 hours) at 08/15/2020 1129 Last data filed at 08/15/2020 1113 Gross per 24 hour  Intake 800 ml  Output 1125 ml  Net -325 ml   Orthostatic VS for the past 24 hrs:  BP- Lying Pulse- Lying BP- Sitting Pulse- Sitting BP- Standing at 0 minutes Pulse- Standing at 0 minutes  08/15/20 0823 155/64 60 125/50 64 114/69 77     GENERAL- Alert, pleasant, NAD. Well-developed and well-nourished.  HEENT- Atraumatic, normocephalic, oral mucosa appears moist CARDIAC- RRR, no murmurs, rubs or gallops. RESP-Normal effort, very mild basilar crackles ABDOMEN- soft, non-tender, nondistended NEURO- alert and oriented EXTREMITIES- no pedal edema. SKIN- Warm, dry PSYCH- Normal mood and affect, appropriate thought content and speech.   Assessment/Plan: 80 y/o M with medical Hx of Dementia, CVA, seizures, HTN, HLD, DM2, Hx of latent syphilis, who brought by EMS from SNF due to fever, hypoxia and after found lying on the floor; and admitted for CAP and cardiac evaluation.    Principal Problem:   CAP (community acquired pneumonia) Active Problems:   Vascular dementia ([place])   Type II diabetes mellitus with neurological manifestations ([place])   PNA (pneumonia)   Fall   Rhabdomyolysis  Community Acquired Pneumonia Patient presented following mechanical fall 2/2 weakness and shortness  of breath, with notable fever and O2 sat of 87% preceding hospital arrival; CXR and clinical presentation congruent with pneumonia as etiology. Afebrile with normal WBC, tolerating antibiotics; and neurologically stable. Blood culture with no growth.  -Continue Azithromycin 250 mg p.o  -Transitioned from IV Ceftriaxone to Cefdinir 300 mg bid -CBC monitoring -Supplemental oxygen if needed  Rhabdomyolysis Patient has a hx of seizures but denies seizure-like activity preceding event. Likely secondary to fall. Evident per urinalysis and elevated CK at 4,761 on admission--->3,124 today. Received LR bolus and infusion yesterday. Resolved mild AKI ; Creatinine now at baseline (1.26-->1.06 today).   Mild AKI -likely contribution of myoglobinuria and mild malnutrition secondary to infection. Received fluids. Resolved mild AKI ; Creatinine now at baseline (1.26-->1.06 today).   Orthostatic hypotension Patient with positive Orthostatic vitals, and endorsing intermittent lightheadedness with quick standing. Fluids were previously administered. Patient has good [employer].is. Evaluated by PT/OT -Holding home amlodipine  Urinary retention Patient on home Flomax. This was initially held in light of his orthostatic hypotension. Urinary retention per bladder scan, requiring catheterization. Flomax resumed, with ~325 mL output thus far following.  -continue home Flomax  HTN Patient has hx of HTN.  -continue home Metoprolol 25mg  -Holding Amlodipine due to orthostatic hypotension. Recommend tailoring dose outpatient.  Chronic Anemia Patient has a hx of chronic normocytic anemia; takes home ferrous sulfate. Denied symptoms of GI blood loss. Last colonoscopy in 2014 (sessile polyps that were removed). Mild microcytic anemia with hgb of 12.1-->11.7, low  iron, low-normal TIBC, and ferritin elevated at 1,284 likely due to an acute inflammatory reaction in the context of his pneumonia.  -Trend CBC -holding home  ferrous sulfate 325 mg in light of infection  Hx of Seizure -Continue Keppra home med -Seizure precautions   LOS: 2 days   Audria Nine, Medical Student 08/15/2020, 11:29 AM

## 2020-08-15 NOTE — Progress Notes (Signed)
  Echocardiogram 2D Echocardiogram has been performed.  Mark Soto 08/15/2020, 12:18 PM

## 2020-08-15 NOTE — Plan of Care (Signed)

## 2020-08-15 NOTE — NC FL2 (Signed)
[place] MEDICAID FL2 LEVEL OF CARE SCREENING TOOL     IDENTIFICATION  Patient Name: Mark Soto Birthdate: 1940-10-27 Sex: male Admission Date (Current Location): 08/13/2020  [facility] and [place] Number:  Herbalist and Address:  The [place]. [facility], [place] [address], [place], [place] [address]      Provider Number: [id number]  Attending Physician Name and Address:  Axel Filler, *  Relative Name and Phone Number:       Current Level of Care: Hospital Recommended Level of Care: [facility] [place] Prior Approval Number:    Date Approved/Denied:   PASRR Number:    Discharge Plan: Other (Comment) ([facility])    Current Diagnoses: Patient Active Problem List   Diagnosis Date Noted  . Rhabdomyolysis 08/14/2020  . PNA (pneumonia) 08/13/2020  . Fall 08/13/2020  . Type II diabetes mellitus with neurological manifestations ([place]) 07/22/2015  . CAP (community acquired pneumonia) 07/11/2015  . History of CVA (cerebrovascular accident) 09/11/2014  . Obesity (BMI 30.0-34.9) 09/11/2014  . Syphilis, latent 09/04/2014  . Vascular dementia ([place]) 09/04/2014  . Essential hypertension, benign 06/25/2014  . BPH (benign prostatic hyperplasia) 06/25/2014  . HLD (hyperlipidemia)   . PFO (patent foramen ovale)   . Seizure ([place]) 06/13/2014  . History of colonic polyps 04/14/2013    Orientation RESPIRATION BLADDER Height & Weight     Self,Place  Normal Continent,Incontinent Weight: 105.1 kg Height:  6' (182.9 cm)  BEHAVIORAL SYMPTOMS/MOOD NEUROLOGICAL BOWEL NUTRITION STATUS      Incontinent Diet (regular, no concentrated sweets)  AMBULATORY STATUS COMMUNICATION OF NEEDS Skin   Limited Assist Verbally Normal                       Personal Care Assistance Level of Assistance  Bathing,Dressing,Feeding Bathing Assistance: Limited assistance Feeding assistance: Limited assistance Dressing Assistance: Limited assistance      Functional Limitations Info  Sight,Hearing,Speech Sight Info: Adequate Hearing Info: Adequate Speech Info: Adequate    SPECIAL CARE FACTORS FREQUENCY  PT (By licensed PT),OT (By licensed OT)     PT Frequency: HH PT to eval and treat OT Frequency: [place] OT to eval and treat            Contractures Contractures Info: Not present    Additional Factors Info  Code Status,Allergies Code Status Info: Full Allergies Info: No known drug allergies           Current Medications (08/15/2020):  This is the current hospital active medication list Current Facility-Administered Medications  Medication Dose Route Frequency Provider Last Rate Last Admin  . aspirin EC tablet 81 mg  81 mg Oral Daily Masoudi, Elhamalsadat, MD   81 mg at 08/15/20 0857  . azithromycin (ZITHROMAX) tablet 250 mg  250 mg Oral Daily Masoudi, Elhamalsadat, MD   250 mg at 08/15/20 0900  . cefdinir (OMNICEF) capsule 300 mg  300 mg Oral Q12H Andrew Au, MD      . enoxaparin (LOVENOX) injection 40 mg  40 mg Subcutaneous Q24H Masoudi, Elhamalsadat, MD   40 mg at 08/14/20 1543  . guaiFENesin (ROBITUSSIN) 100 MG/5ML solution 200 mg  10 mL Oral Q4H PRN Masoudi, Elhamalsadat, MD      . insulin aspart (novoLOG) injection 0-5 Units  0-5 Units Subcutaneous QHS Masoudi, Elhamalsadat, MD      . insulin aspart (novoLOG) injection 0-9 Units  0-9 Units Subcutaneous TID WC Masoudi, Elhamalsadat, MD   1 Units at 08/14/20 1250  .  levETIRAcetam (KEPPRA) tablet 500 mg  500 mg Oral BID Masoudi, Elhamalsadat, MD   500 mg at 08/15/20 0857  . metoprolol tartrate (LOPRESSOR) tablet 25 mg  25 mg Oral BID Masoudi, Elhamalsadat, MD   25 mg at 08/15/20 0857  . tamsulosin (FLOMAX) capsule 0.4 mg  0.4 mg Oral QPC breakfast Remo Lipps, MD   0.4 mg at 08/15/20 5790     Discharge Medications: Please see discharge summary for a list of discharge medications.  Relevant Imaging Results:  Relevant Lab Results:   Additional Information SSN  [id number]  Bess Kinds, RN

## 2020-08-15 NOTE — TOC Progression Note (Signed)
Transition of Care [facility]) - Progression Note    Patient Details  Name: Mark Soto MRN: [id number] Date of Birth: 10-13-1940  Transition of [facility]) CM/SW Contact  Bess Kinds, RN Phone Number: [phone] 08/15/2020, 4:13 PM  Clinical Narrative:     Covid test still pending. Patient cannot admit after 5 pm. Left voicemail for residential director, Lurena Joiner, to coordinate transportation back to [employer] tomorrow morning. TOC following for transition needs.     Barriers to Discharge: Continued Medical Work up  Expected Discharge Plan and Services   In-house Referral: Clinical Social Work     Living arrangements for the past 2 months: Assisted Living Facility Expected Discharge Date: 08/15/20                                     Social Determinants of Health (SDOH) Interventions    Readmission Risk Interventions No flowsheet data found.

## 2020-08-15 NOTE — Progress Notes (Signed)
Pt had not void for this shift, bladder scan showed 375 ml. Pt has been trying to void for last 30 min but has not been able to. On call MD called and notified, received order for In and out cath. Pt resting in bed without any distress, pt stated he does not feel any pressure or pain. Also notified MD that he is transferring to 31M-20. Will continue to monitor.

## 2020-08-15 NOTE — Consult Note (Signed)
Mark Soto is a 80 year old male presenting due to a fall, fever, and cough. His past medical history includes dementia, CVA, seizures, HTN, and latent syphilis. The team ordered empiric antibiotic therapy including azithromycin and ceftriaxone IV for community-acquired pneumonia. The patient already received 2 days of antibiotic therapy. In rounds today (08/15/2020), Mark Soto was clinically improved with stable vital signs and labs. I recommend stopping ceftriaxone IV, continuing azithromycin PO, initiating cefdinir PO for 3 days. The total duration of therapy is a minimum of 5 days. The cefdinir dosing is 300mg  twice daily. I discussed with the team and the nurse to start cefdinir this morning. The team planned to discharge him later today.  , PharmD Candidate

## 2020-08-15 NOTE — TOC Progression Note (Signed)
Transition of Care [facility]) - Progression Note    Patient Details  Name: Mark Soto MRN: [id number] Date of Birth: [birth year]  [facility]) CM/SW Contact  Bess Kinds, RN Phone Number: [phone] 08/15/2020, 1:38 PM  Clinical Narrative:     DC summary, FL2, and HH orders faxed to [employer]. Covid test remains pending at this time.   Barriers to Discharge: Continued Medical Work up  Expected Discharge Plan and Services   In-house Referral: Clinical Social Work     Living arrangements for the past 2 months: Assisted Living Facility Expected Discharge Date: 08/15/20                                     Social Determinants of Health (SDOH) Interventions    Readmission Risk Interventions No flowsheet data found.

## 2020-08-15 NOTE — Progress Notes (Signed)
In and out cath done per MD order. 400 ml clear yellow urine received. Pt tolerated well. Will continue to monitor.

## 2020-08-16 LAB — COMPREHENSIVE METABOLIC PANEL
ALT: 31 U/L (ref 0–44)
AST: 61 U/L — ABNORMAL HIGH (ref 15–41)
Albumin: 2.9 g/dL — ABNORMAL LOW (ref 3.5–5.0)
Alkaline Phosphatase: 55 U/L (ref 38–126)
Anion gap: 12 (ref 5–15)
BUN: 13 mg/dL (ref 8–23)
CO2: 25 mmol/L (ref 22–32)
Calcium: 8.4 mg/dL — ABNORMAL LOW (ref 8.9–10.3)
Chloride: 103 mmol/L (ref 98–111)
Creatinine, Ser: 0.95 mg/dL (ref 0.61–1.24)
GFR, Estimated: >60 mL/min (ref >60)
Glucose, Bld: 121 mg/dL — ABNORMAL HIGH (ref 70–99)
Potassium: 3.9 mmol/L (ref 3.5–5.1)
Sodium: 140 mmol/L (ref 135–145)
Total Bilirubin: 0.8 mg/dL (ref 0.3–1.2)
Total Protein: 6.6 g/dL (ref 6.5–8.1)

## 2020-08-16 LAB — LEVETIRACETAM LEVEL: Levetiracetam Lvl: 8.8 ug/mL — ABNORMAL LOW (ref 10.0–40.0)

## 2020-08-16 LAB — CBC
HCT: 38.6 % — ABNORMAL LOW (ref 39.0–52.0)
Hemoglobin: 11.9 g/dL — ABNORMAL LOW (ref 13.0–17.0)
MCH: 23.5 pg — ABNORMAL LOW (ref 26.0–34.0)
MCHC: 30.8 g/dL (ref 30.0–36.0)
MCV: 76.1 fL — ABNORMAL LOW (ref 80.0–100.0)
Platelets: 155 10*3/uL (ref 150–400)
RBC: 5.07 MIL/uL (ref 4.22–5.81)
RDW: 16.4 % — ABNORMAL HIGH (ref 11.5–15.5)
WBC: 5.6 10*3/uL (ref 4.0–10.5)
nRBC: 0 % (ref 0.0–0.2)

## 2020-08-16 LAB — GLUCOSE, CAPILLARY: Glucose-Capillary: 104 mg/dL — ABNORMAL HIGH (ref 70–99)

## 2020-08-16 NOTE — Progress Notes (Signed)
   Subjective:   Patient states he is feeling well still, ready to return to ALF. He is in good spirits.   Objective:  Vital signs in last 24 hours: Vitals:   08/15/20 2113 08/16/20 0457 08/16/20 0858 08/16/20 0900  BP: (!) 141/68 130/62 139/61 132/64  Pulse: 61 60 65 70  Resp: 18 18 18 18   Temp: 98.7 F (37.1 C) 98.1 F (36.7 C) 98.8 F (37.1 C) 98.4 F (36.9 C)  TempSrc: Oral Oral  Oral  SpO2: 97% 98% 93% 98%  Weight:      Height:        Physical Exam Constitutional: no acute distress Head: atraumatic ENT: external ears normal Pulmonary: effort normal Neurological: alert, no focal deficit Psychiatric: normal mood and affect  Assessment/Plan: Mark Soto is a 80 y.o. male with hx of Dementia,CVA, seizures, HTN, HLD,DM2, Hx of latent syphilis, whobrought by EMS from SNF due to fever, hypoxia, and syncope. Workup significant for pneumonia and doing better with Abx.   Principal Problem:   CAP (community acquired pneumonia) Active Problems:   Vascular dementia ([name])   Type II diabetes mellitus with neurological manifestations ([name])   PNA (pneumonia)   Fall   Rhabdomyolysis   Community Acquired Pneumonia Patient presented following mechanical fall 2/2 weakness and shortness of breath, with notable fever and O2 sat of 87% preceding hospital arrival; CXR and clinical presentation congruent with pneumonia as etiology. Afebrile with normal WBC, tolerating antibiotics; and neurologically stable. Blood culture with no growth.  -Continue Azithromycin 250 mg p.o, day 4/5 -Continue Cefdinir 300 mg bid, day 4/5 -CBC monitoring -Supplemental oxygen if needed  Rhabdomyolysis - resolving Mild AKI Elevated CK at 4,761 on admission--->3,124 with fluids. AKI also resolved 1.26-->1.06.   Orthostatic hypotension Patient with positive Orthostatic vitals, and endorsing intermittent lightheadedness with quick standing. Fluids were previously administered. Patient has good  [email or web address].is. Evaluated by PT/OT and rec HH. -Holding home amlodipine  Urinary retention - resolved Patient on home Flomax. This was initially held in light of his orthostatic hypotension. Urinary retention per bladder scan, requiring catheterization. Flomax resumed, with ~325 mL output thus far following.  -continue home Flomax  HTN Patient has hx of HTN.  -continue home Metoprolol 25mg  -Holding Amlodipine due to orthostatic hypotension. Recommend tailoring dose outpatient.  Hx of Seizure -ContinueKeppra home med -Seizure precautions  Diet:  Carb modified IVF:  none VTE:  lovenox Prior to Admission Living Arrangement:  ALF Anticipated Discharge Location:  ALF Barriers to Discharge:  none Dispo: Anticipated discharge today.   Mark Au, MD 08/16/2020, 11:20 AM Pager: [phone] After 5pm on weekdays and 1pm on weekends: On Call pager [phone]

## 2020-08-16 NOTE — TOC Transition Note (Signed)
Transition of Care [facility]) - CM/SW Discharge Note   Patient Details  Name: Mark Soto MRN: [id number] Date of Birth: 1940-12-29  Transition of Care [facility]) CM/SW Contact:  Bartholomew Crews, RN Phone Number: [phone] 08/16/2020, 11:09 AM   Clinical Narrative:     Patient to transition back to [facility] today. Discussed continued readiness to transition with MD. Covid test negative. Spoke with [employer], Mudlogger at [facility], and they are ready to accept patient back. [facility] unable to provide transportation. Transporation arranged for door-to-door through [employer]. [place] waiver signed and faxed to transportation. Daughter and facility notified of transportation arrangements. Patient has walker at bedside to take with him. No further TOC needs identified.   Final next level of care: [place] Barriers to Discharge: No Barriers Identified   Patient Goals and CMS Choice Patient states their goals for this hospitalization and ongoing recovery are:: return to [facility]F CMS Medicare.gov Compare Post Acute Care list provided to:: Patient Choice offered to / list presented to : NA  Discharge Placement                  Name of family member notified: Prentiss Bells, daughter Patient and family notified of of transfer: 08/16/20  Discharge Plan and Services In-house Referral: Clinical Social Work                                   Social Determinants of Health ([place]) Interventions     Readmission Risk Interventions No flowsheet data found.

## 2020-08-16 NOTE — Progress Notes (Signed)
DISCHARGE NOTE SNF [facility] to be discharged assisted living facility per MD order. Patient verbalized understanding.  Skin clean, dry and intact without evidence of skin break down, no evidence of skin tears noted. IV catheter discontinued intact. Site without signs and symptoms of complications. Dressing and pressure applied. Pt denies pain at the site currently. No complaints noted.  Patient free of lines, drains, and wounds.   Discharge packet assembled. An After Visit Summary (AVS) was printed and given to the patient and faxed to the facility. Patient escorted via wheelchair and discharged to designated assisted  Living Facility via [facility] transportation. Accepting facility contacted by case worker; all questions and concerns addressed.   Josephina Melcher S Mattson Dayal, RN _______________________________________________________________________

## 2020-08-18 LAB — CULTURE, BLOOD (SINGLE)
Culture: NO GROWTH
Special Requests: ADEQUATE

## 2020-11-15 ENCOUNTER — Ambulatory Visit: Payer: Medicare HMO | Admitting: Internal Medicine

## 2020-12-13 ENCOUNTER — Ambulatory Visit (INDEPENDENT_AMBULATORY_CARE_PROVIDER_SITE_OTHER): Payer: Medicare HMO | Admitting: Internal Medicine

## 2020-12-13 ENCOUNTER — Encounter: Payer: Self-pay | Admitting: Internal Medicine

## 2020-12-13 ENCOUNTER — Other Ambulatory Visit: Payer: Self-pay

## 2020-12-13 DIAGNOSIS — I951 Orthostatic hypotension: Secondary | ICD-10-CM

## 2020-12-13 NOTE — Patient Instructions (Signed)

## 2020-12-13 NOTE — Progress Notes (Signed)
HPI [name] returns today after a 4 year absence from our EP clinic. 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 He also has a h/o dementia and is on Aricept. He has had some dizziness in the past. He denies chest pain or sob. Minimal leg swelling. He denies passing out. He had an ILR inserted 4 years ago.  No Known Allergies   Current Outpatient Medications  Medication Sig Dispense Refill  . aspirin EC 81 MG tablet Take 81 mg by mouth daily. Swallow whole.    Marland Kitchen atorvastatin (LIPITOR) 10 MG tablet Take 10 mg by mouth daily.    Marland Kitchen azithromycin (ZITHROMAX) 250 MG tablet Take 1 tablet (250 mg total) by mouth daily. 2 tablet 0  . cefdinir (OMNICEF) 300 MG capsule Take 1 capsule (300 mg total) by mouth every 12 (twelve) hours. 5 capsule 0  . donepezil (ARICEPT) 10 MG tablet Take 10 mg by mouth daily.    . ferrous sulfate 325 (65 FE) MG tablet Take 1 tablet (325 mg total) by mouth daily with breakfast. 90 tablet 1  . guaiFENesin (ROBITUSSIN) 100 MG/5ML liquid Take 10 mLs by mouth every 4 (four) hours as needed for cough.    . levETIRAcetam (KEPPRA) 500 MG tablet Take 1 tablet (500 mg total) by mouth 2 (two) times daily. 60 tablet 5  . metFORMIN (GLUCOPHAGE) 500 MG tablet Take 1 tablet (500 mg total) by mouth daily with breakfast. 90 tablet 1  . metoprolol tartrate (LOPRESSOR) 25 MG tablet Take 1 tablet (25 mg total) by mouth 2 (two) times daily. 60 tablet 3  . sertraline (ZOLOFT) 100 MG tablet Take 100 mg by mouth daily.    . tamsulosin (FLOMAX) 0.4 MG CAPS capsule Take 1 capsule (0.4 mg total) by mouth daily after breakfast. 30 capsule 4   No current facility-administered medications for this visit.     Past Medical History:  Diagnosis Date  . Abdominal wall hernia   . Acute left PCA stroke ([place]) 06/13/2014  . Anemia   . BPH (benign prostatic hyperplasia)   . Glucose intolerance (impaired glucose tolerance)   .  Hyperlipidemia   . Hypertension   . 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  . Personal history of colonic adenomas 04/14/2013  . Pneumonia 08/13/2020  . Seizures ([place]) 06/13/2014  . Tinnitus    Right Ear    ROS:   All systems reviewed and negative except as noted in the HPI.   Past Surgical History:  Procedure Laterality Date  . LOOP RECORDER IMPLANT N/A 06/19/2014   Procedure: LOOP RECORDER IMPLANT;  Surgeon: Evans Lance, MD;  Location: [facility] CATH LAB;  Service: Cardiovascular;  Laterality: N/A;  . none    . TEE WITHOUT CARDIOVERSION N/A 06/15/2014   Procedure: TRANSESOPHAGEAL ECHOCARDIOGRAM (TEE);  Surgeon: Fay Records, MD;  Location: [facility] ENDOSCOPY;  Service: Cardiovascular;  Laterality: N/A;     Family History  Problem Relation Age of Onset  . Breast cancer Mother   . Liver cancer Mother   . Lung cancer Mother   . COPD Father   . Hypertension Sister   . Hypertension Brother   . Hypertension Sister   . Colon cancer Neg Hx   . Colon polyps Neg Hx   . Stomach cancer Neg Hx      Social History   Socioeconomic History  . Marital status: Married  Spouse name: Not on file  . Number of children: Not on file  . Years of education: Not on file  . Highest education level: Not on file  Occupational History  . Occupation: Retired  Tobacco Use  . Smoking status: Never Smoker  . Smokeless tobacco: Never Used  Vaping Use  . Vaping Use: Never used  Substance and Sexual Activity  . Alcohol use: No    Alcohol/week: 0.0 standard drinks  . Drug use: No  . Sexual activity: Not on file  Other Topics Concern  . Not on file  Social History Narrative   Occasionally drinks coffee      Marital status:  Married x 43 years; first wife.      Children:   3 children; 5 grandchildren; 3 gg.      Lives: Lives with wife at home and 28 year old son. Son has special needs, sleep apnea, & severe seizure disorder. Wife has "many healthy problems". Another daughter in [place].  Daughter in [place].       Employment:  Retired at age 63.      Tobacco; none      Alcohol: rarely      Exercise:  none      3 sisters live in [place]. 1 brother in [place].       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 [place] Strain: Not on [employer] Insecurity: Not on file  Transportation Needs: Not on file  Physical Activity: Not on file  Stress: Not on file  Social Connections: Not on file  Intimate Partner Violence: Not on file     BP 116/60   Pulse (!) 56   Ht 6' (1.829 m)   Wt 238 lb 3.2 oz (108 kg)   SpO2 95%   BMI 32.31 kg/m   Physical Exam:  Well appearing NAD HEENT: Unremarkable Neck:  No JVD, no thyromegally Lymphatics:  No adenopathy Back:  No CVA tenderness Lungs:  Clear with no wheezes HEART:  Regular rate rhythm, no murmurs, no rubs, no clicks Abd:  soft, positive bowel sounds, no organomegally, no rebound, no guarding Ext:  2 plus pulses, no edema, no cyanosis, no clubbing Skin:  No rashes no nodules Neuro:  CN II through XII intact, motor grossly intact   Assess/Plan: 1. Cryptogenic stroke - he has not had any recurrent symptoms. His ILR is at end of service. I offered to remove the device and he will call us if he would like the ILR removed. 2. Orthostasis - he denies any symptoms today. He will undergo watchful waiting. 3. HTN - his bp is well controlled on metoprolol.  Mark Overlie Tasmine Hipwell,MD

## 2021-10-08 IMAGING — CT CT CERVICAL SPINE W/O CM
4 of 7 series · 13 of 33 positions shown, 14 images · non-contrast
Comparison: None.

CLINICAL DATA: Fall

EXAM:
CT HEAD WITHOUT CONTRAST
CT CERVICAL SPINE WITHOUT CONTRAST
TECHNIQUE: Multidetector CT imaging of the head and cervical spine was
performed following the standard protocol without intravenous
contrast. Multiplanar CT image reconstructions of the cervical spine
were also generated.

[Series 5: head bone · axial · 0.44mm/px · z∈[-76,+28]mm · 4 of 88 slices shown]
[im 18/88  bone]
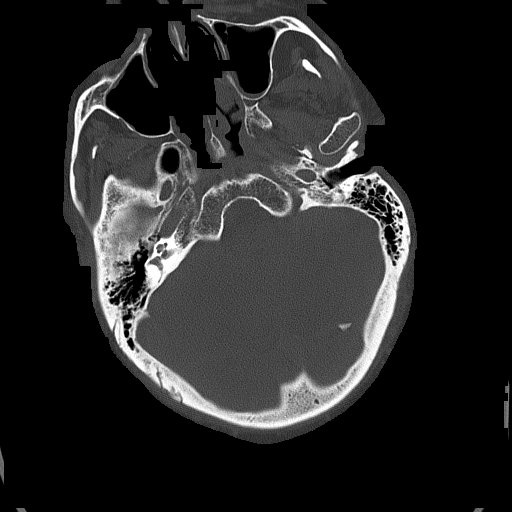
[im 35/88  bone]
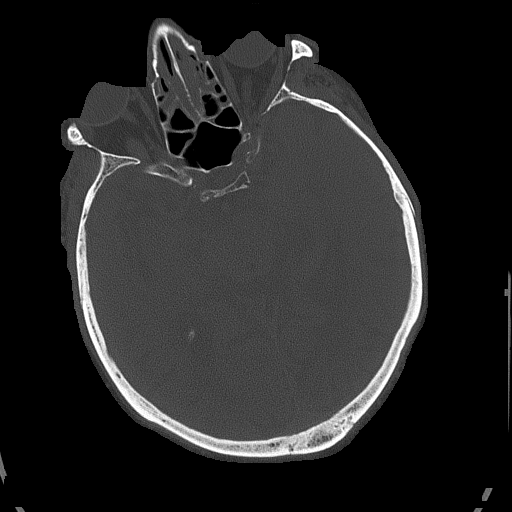
[im 53/88  bone]
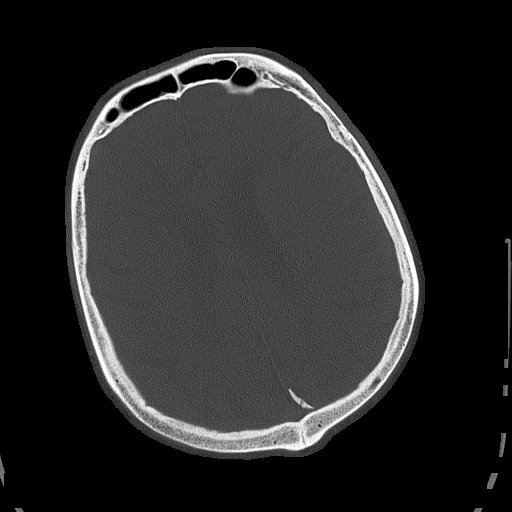
[im 70/88  bone]
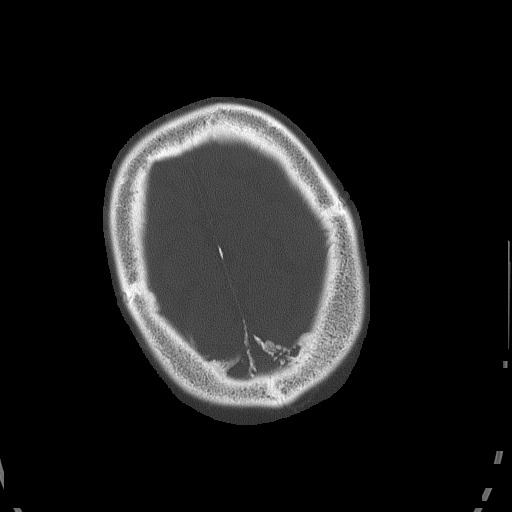

[Series 8: c_spine 2.0 st · axial · 0.30mm/px · z∈[-226,-112]mm · 4 of 96 slices shown, 5 images]
[im 20/96  soft-tissue]
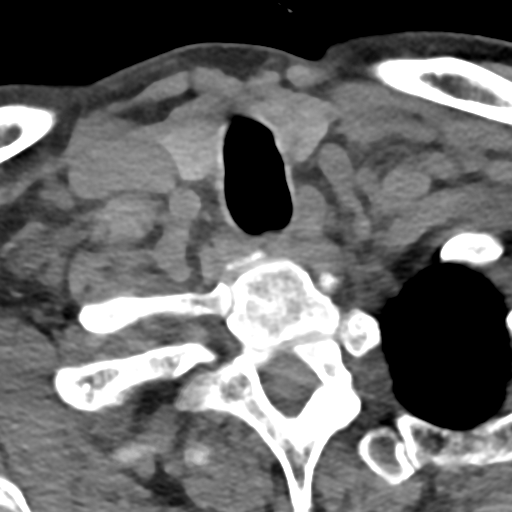
[im 20/96  bone]
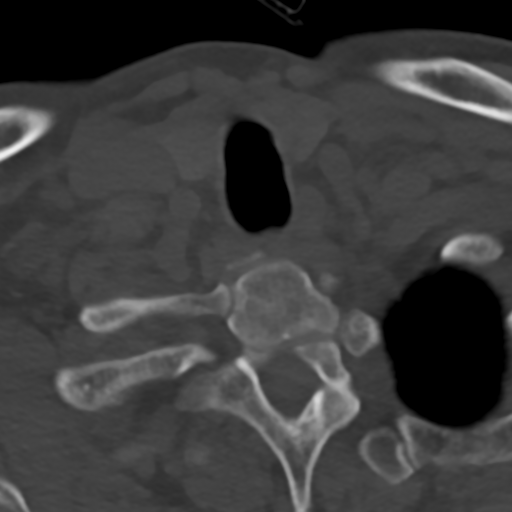
[im 39/96  bone]
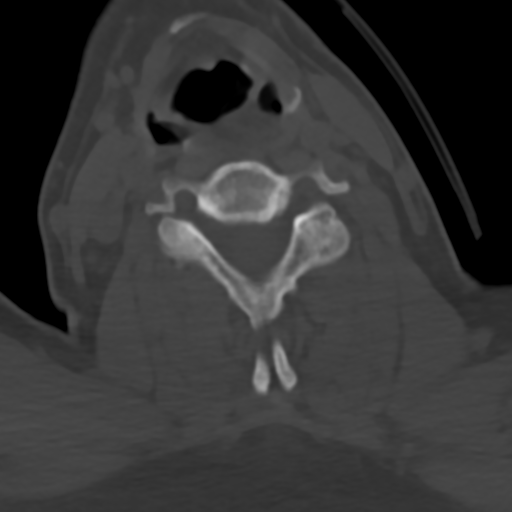
[im 58/96  bone]
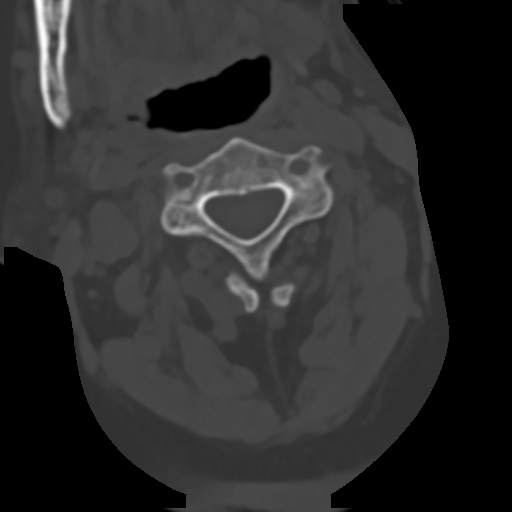
[im 77/96  bone]
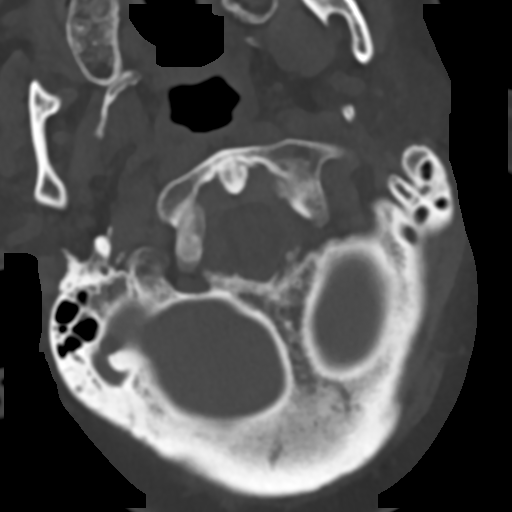

[Series 10: c_spine 2.0 sag bone · sagittal · 0.25mm/px · 4 of 61 slices shown]
[im 13/61  bone]
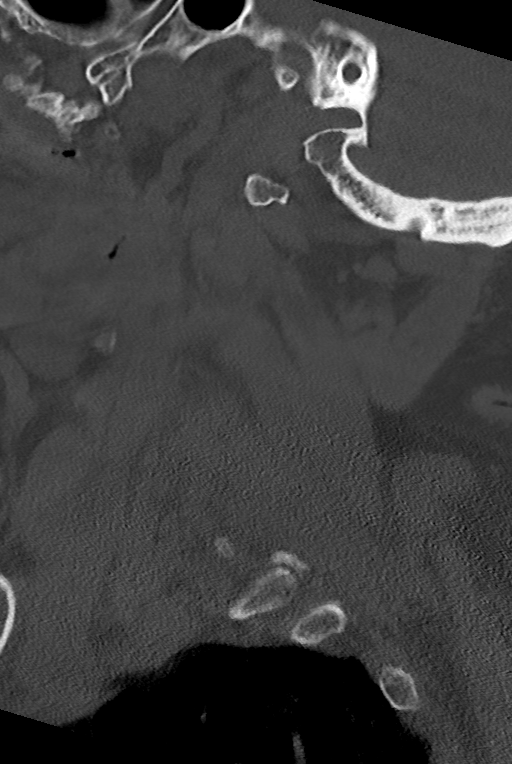
[im 25/61  bone]
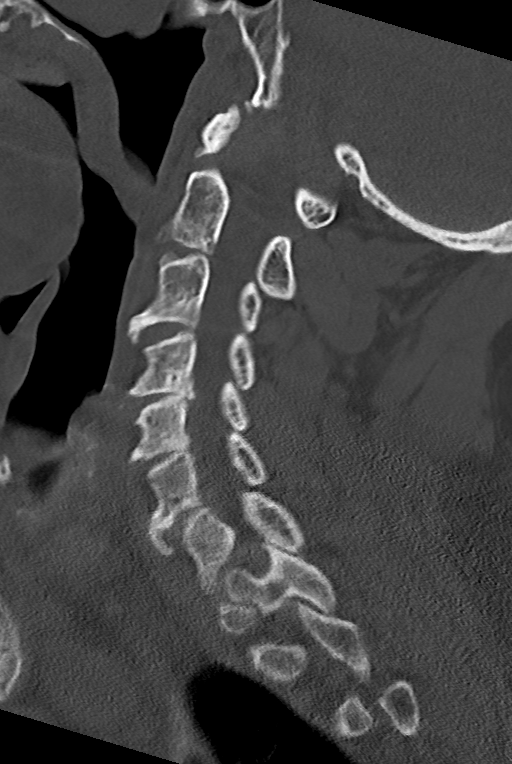
[im 37/61  bone]
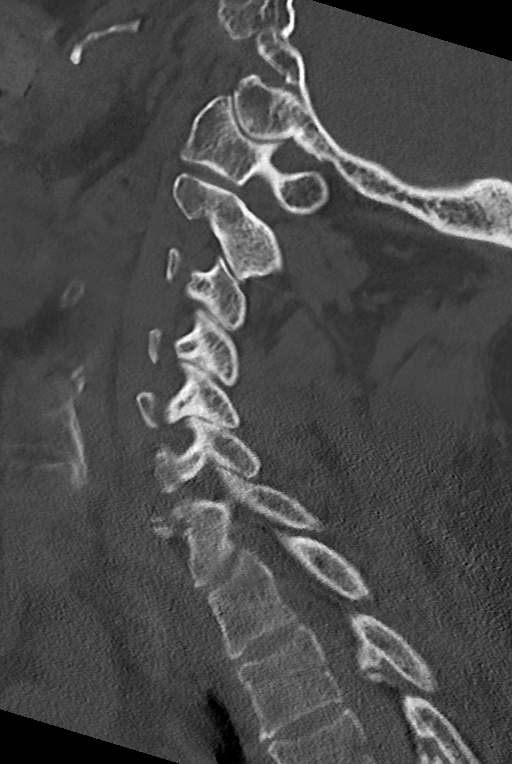
[im 49/61  bone]
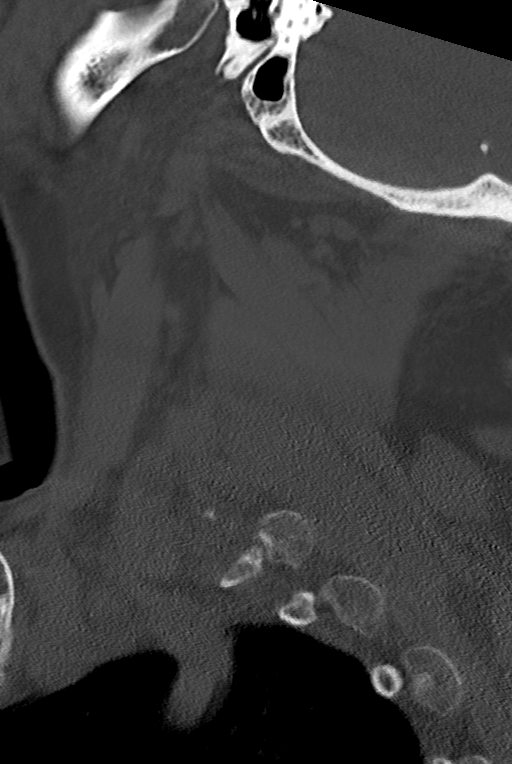

[Series 11: c_spine 2.0 cor bone · coronal · 0.28mm/px · 1 of 61 slices shown]
[im 31/61  bone]
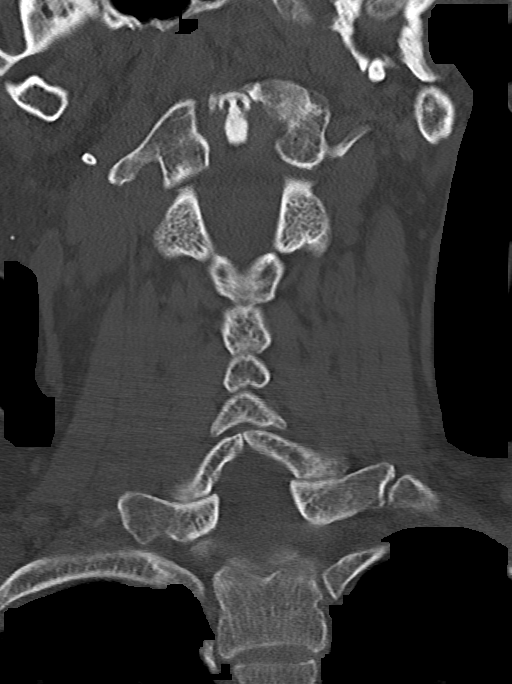

[13 of 33 positions shown; findings below may reference images not displayed]

FINDINGS: CT HEAD FINDINGS

Brain: Generalized atrophy. Moderate white matter changes with
patchy and confluent white matter hypodensity bilaterally. Chronic
infarct left occipital lobe.

Negative for acute infarct, hemorrhage, mass.

Vascular: Negative for hyperdense vessel

Skull: Negative

Sinuses/Orbits: Sinus mucosal disease.  Negative orbit

Other: None

CT CERVICAL SPINE FINDINGS

Alignment: Normal

Skull base and vertebrae: Negative for fracture

Soft tissues and spinal canal: Negative for soft tissue swelling or
mass.

Disc levels: Mild disc degeneration and spurring at C4-5 and C5-6.
No significant stenosis.

Upper chest: Lung apices clear bilaterally

Other: None
IMPRESSION: 1. No acute intracranial abnormality. Atrophy and chronic ischemic
changes as above
2. Negative for cervical spine fracture.

## 2022-08-12 ENCOUNTER — Encounter: Payer: Self-pay | Admitting: Family

## 2022-08-12 ENCOUNTER — Ambulatory Visit (INDEPENDENT_AMBULATORY_CARE_PROVIDER_SITE_OTHER): Payer: Medicare HMO | Admitting: Family

## 2022-08-12 VITALS — BP 130/70 | HR 60 | Temp 97.3°F | Resp 18 | Ht 72.0 in | Wt 232.2 lb

## 2022-08-12 DIAGNOSIS — F321 Major depressive disorder, single episode, moderate: Secondary | ICD-10-CM

## 2022-08-12 DIAGNOSIS — R569 Unspecified convulsions: Secondary | ICD-10-CM | POA: Diagnosis not present

## 2022-08-12 DIAGNOSIS — R269 Unspecified abnormalities of gait and mobility: Secondary | ICD-10-CM

## 2022-08-12 DIAGNOSIS — I10 Essential (primary) hypertension: Secondary | ICD-10-CM | POA: Diagnosis not present

## 2022-08-12 DIAGNOSIS — E782 Mixed hyperlipidemia: Secondary | ICD-10-CM

## 2022-08-12 DIAGNOSIS — E1149 Type 2 diabetes mellitus with other diabetic neurological complication: Secondary | ICD-10-CM

## 2022-08-12 DIAGNOSIS — F0150 Vascular dementia without behavioral disturbance: Secondary | ICD-10-CM

## 2022-08-12 DIAGNOSIS — Z7689 Persons encountering health services in other specified circumstances: Secondary | ICD-10-CM

## 2022-08-12 MED ORDER — ACCU-CHEK SOFTCLIX LANCET DEV KIT: 1.0000 | PACK | Freq: Every day | 11 refills | Status: AC

## 2022-08-12 MED ORDER — BLOOD GLUCOSE MONITOR KIT: 1.0000 | PACK | Freq: Every day | 11 refills | Status: AC

## 2022-08-12 NOTE — Progress Notes (Signed)
Provider: Marlowe Sax FNP-C   Bonnita Nasuti, MD  Patient Care Team: Bonnita Nasuti, MD as PCP - General (Internal Medicine)  Extended Emergency Contact Information Primary Emergency Contact: Elita Quick Address: [address]          [place], [place] [address] [name] of [place] Phone: [phone] Relation: Spouse Secondary Emergency Contact: Thorns,Mariam Address: [address] [place], [place] [address] [name] of [place] Phone: [phone] Relation: Daughter  Code Status:  Full Code  Goals of care: Advanced Directive information    08/12/2022    2:09 PM  Advanced Directives  Does Patient Have a Medical Advance Directive? Yes  Type of Advance Directive [place]  Does patient want to make changes to medical advance directive? No - Patient declined  Copy of [place] in Chart? No - copy requested     Chief Complaint  Patient presents with   Establish Care    New Patient.    HPI:  Pt is a 82 y.o. male seen today establish care here at [facility] for medical management of chronic diseases.Here with his sister and daughter who provides additional HPI information. Resides at ALF facility at [facility].  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   Personal history of colonic adenomas 04/14/2013   Pneumonia 08/13/2020   Seizures ([place]) 06/13/2014   Tinnitus    Right Ear   Past Surgical History:  Procedure  Laterality Date   LOOP RECORDER IMPLANT N/A 06/19/2014   Procedure: LOOP RECORDER IMPLANT;  Surgeon: Evans Lance, MD;  Location: [facility] CATH LAB;  Service: Cardiovascular;  Laterality: N/A;   none     TEE WITHOUT CARDIOVERSION N/A 06/15/2014   Procedure: TRANSESOPHAGEAL ECHOCARDIOGRAM (TEE);  Surgeon: Fay Records, MD;  Location: [facility] ENDOSCOPY;  Service: Cardiovascular;  Laterality: N/A;    No Known Allergies  Allergies as of 08/12/2022   No Known Allergies      Medication List        Accurate as of August 12, 2022  2:32 PM. If you have any questions, ask your nurse or doctor.          STOP taking these medications    azithromycin 250 MG tablet Commonly known as: ZITHROMAX Stopped by: Sandrea Hughs, NP   cefdinir 300 MG capsule Commonly known as: OMNICEF Stopped by: Sandrea Hughs, NP   tamsulosin 0.4 MG Caps capsule Commonly known as: FLOMAX Stopped by: Sandrea Hughs, NP       TAKE these medications    amLODipine 5 MG tablet Commonly known as: NORVASC Take 5 mg by mouth daily.   aspirin EC 81 MG tablet Take 81 mg by mouth daily. Swallow whole.   atorvastatin 10 MG tablet Commonly known as: LIPITOR Take 10 mg by mouth at bedtime.   donepezil 10 MG tablet Commonly known as: ARICEPT Take  10 mg by mouth at bedtime.   ferrous sulfate 325 (65 FE) MG EC tablet Take 325 mg by mouth 2 (two) times daily. What changed: Another medication with the same name was removed. Continue taking this medication, and follow the directions you see here. Changed by: Sandrea Hughs, NP   guaiFENesin 100 MG/5ML liquid Commonly known as: ROBITUSSIN Take 10 mLs by mouth every 4 (four) hours as needed for cough.   hydrALAZINE 25 MG tablet Commonly known as: APRESOLINE Take 25 mg by mouth daily.   levETIRAcetam 500 MG tablet Commonly known as: KEPPRA Take 500 mg by mouth daily. What changed: Another medication with the same name was removed. Continue taking this  medication, and follow the directions you see here. Changed by: Sandrea Hughs, NP   lisinopril 10 MG tablet Commonly known as: ZESTRIL Take 10 mg by mouth daily.   metFORMIN 500 MG tablet Commonly known as: GLUCOPHAGE Take 500 mg by mouth 2 (two) times daily with a meal. What changed: Another medication with the same name was removed. Continue taking this medication, and follow the directions you see here. Changed by: Sandrea Hughs, NP   metoprolol tartrate 25 MG tablet Commonly known as: LOPRESSOR Take 25 mg by mouth daily. What changed: Another medication with the same name was removed. Continue taking this medication, and follow the directions you see here. Changed by: Sandrea Hughs, NP   sertraline 100 MG tablet Commonly known as: ZOLOFT Take 100 mg by mouth daily.        Review of Systems  Unable to perform ROS: Dementia (additional HPI information provided by sister and daughter.)  Constitutional:  Negative for appetite change, chills, fatigue, fever and unexpected weight change.  HENT:  Negative for congestion, dental problem, ear discharge, ear pain, facial swelling, hearing loss, nosebleeds, postnasal drip, rhinorrhea, sinus pressure, sinus pain, sneezing, sore throat, tinnitus and trouble swallowing.   Eyes:  Negative for pain, discharge, redness, itching and visual disturbance.  Respiratory:  Negative for cough, chest tightness, shortness of breath and wheezing.   Cardiovascular:  Negative for chest pain, palpitations and leg swelling.  Gastrointestinal:  Negative for abdominal distention, abdominal pain, blood in stool, constipation, diarrhea, nausea and vomiting.  Endocrine: Negative for cold intolerance, heat intolerance, polydipsia, polyphagia and polyuria.  Genitourinary:  Negative for difficulty urinating, dysuria, flank pain, frequency and urgency.       Voids 3-4 times at night  Dribbles   Musculoskeletal:  Negative for arthralgias, back pain, gait  problem, joint swelling, myalgias, neck pain and neck stiffness.  Skin:  Negative for color change, pallor, rash and wound.  Neurological:  Negative for dizziness, syncope, speech difficulty, weakness, light-headedness, numbness and headaches.  Hematological:  Does not bruise/bleed easily.  Psychiatric/Behavioral:  Negative for agitation, behavioral problems, confusion, hallucinations and sleep disturbance. The patient is not nervous/anxious.        Depression  Memory loss     Immunization History  Administered Date(s) Administered   Influenza-Unspecified 06/10/2014   Pneumococcal Polysaccharide-23 06/18/2014   Pertinent  Health Maintenance Due  Topic Date Due   FOOT EXAM  Never done   OPHTHALMOLOGY EXAM  Never done   COLONOSCOPY (Pts 45-24yr Insurance coverage will need to be confirmed)  04/06/2014   HEMOGLOBIN A1C  02/10/2021   INFLUENZA VACCINE  03/10/2022      08/15/2020    6:29 AM 08/15/2020    8:55 AM 08/16/2020    6:36 AM 08/16/2020   10:22  AM 08/12/2022    2:09 PM  Fall Risk  Falls in the past year?     0  Was there an injury with Fall?     0  Fall Risk Category Calculator     0  Fall Risk Category     Low  Patient Fall Risk Level High fall risk High fall risk High fall risk High fall risk Low fall risk  Patient at Risk for Falls Due to     No Fall Risks  Fall risk Follow up     Falls evaluation completed   Functional Status Survey:    Vitals:   08/12/22 1402  BP: 130/70  Pulse: 60  Resp: 18  Temp: (!) 97.3 F (36.3 C)  SpO2: 98%  Weight: 232 lb 3.2 oz (105.3 kg)  Height: 6' (1.829 m)   Body mass index is 31.49 kg/m. Physical Exam Vitals reviewed.  Constitutional:      General: He is not in acute distress.    Appearance: Normal appearance. He is obese. He is not ill-appearing or diaphoretic.  HENT:     Head: Normocephalic.     Right Ear: Tympanic membrane, ear canal and external ear normal. There is no impacted cerumen.     Left Ear: Tympanic membrane, ear  canal and external ear normal. There is no impacted cerumen.     Nose: Nose normal. No congestion or rhinorrhea.     Mouth/Throat:     Mouth: Mucous membranes are moist.     Pharynx: Oropharynx is clear. No oropharyngeal exudate or posterior oropharyngeal erythema.  Eyes:     General: No scleral icterus.       Right eye: No discharge.        Left eye: No discharge.     Extraocular Movements: Extraocular movements intact.     Conjunctiva/sclera: Conjunctivae normal.     Pupils: Pupils are equal, round, and reactive to light.  Neck:     Vascular: No carotid bruit.  Cardiovascular:     Rate and Rhythm: Normal rate and regular rhythm.     Pulses: Normal pulses.     Heart sounds: Normal heart sounds. No murmur heard.    No friction rub. No gallop.  Pulmonary:     Effort: Pulmonary effort is normal. No respiratory distress.     Breath sounds: Normal breath sounds. No wheezing, rhonchi or rales.  Chest:     Chest wall: No tenderness.  Abdominal:     General: Bowel sounds are normal. There is no distension.     Palpations: Abdomen is soft. There is no mass.     Tenderness: There is no abdominal tenderness. There is no right CVA tenderness, left CVA tenderness, guarding or rebound.  Musculoskeletal:        General: No swelling or tenderness. Normal range of motion.     Cervical back: Normal range of motion. No rigidity or tenderness.     Right lower leg: No edema.     Left lower leg: No edema.  Feet:     Right foot:     Skin integrity: Callus and dry skin present. No erythema or warmth.     Left foot:     Skin integrity: Callus and dry skin present. No erythema or warmth.  Lymphadenopathy:     Cervical: No cervical adenopathy.  Skin:    General: Skin is warm and dry.     Coloration: Skin is not pale.     Findings: No  bruising, erythema, lesion or rash.  Neurological:     Mental Status: He is alert and oriented to person, place, and time.     Cranial Nerves: No cranial nerve  deficit.     Sensory: No sensory deficit.     Motor: No weakness.     Coordination: Coordination normal.     Gait: Gait abnormal.  Psychiatric:        Mood and Affect: Mood normal.        Speech: Speech normal.        Behavior: Behavior normal.        Thought Content: Thought content normal.        Cognition and Memory: Memory is impaired.        Judgment: Judgment normal.     Labs reviewed: No results for input(s): "NA", "K", "CL", "CO2", "GLUCOSE", "BUN", "CREATININE", "CALCIUM", "MG", "PHOS" in the last 8760 hours. No results for input(s): "AST", "ALT", "ALKPHOS", "BILITOT", "PROT", "ALBUMIN" in the last 8760 hours. No results for input(s): "WBC", "NEUTROABS", "HGB", "HCT", "MCV", "PLT" in the last 8760 hours. Lab Results  Component Value Date   TSH 0.757 06/13/2014   Lab Results  Component Value Date   HGBA1C 6.2 (H) 08/13/2020   Lab Results  Component Value Date   CHOL 113 09/10/2014   HDL 46 09/10/2014   LDLCALC 55 09/10/2014   TRIG 59 09/10/2014   CHOLHDL 2.5 09/10/2014    Significant Diagnostic Results in last 30 days:  No results found.  Assessment/Plan 1. Encounter to establish care Available records reviewed.Recommend fasting labs.Daughter will obtain immunization records from the facility then will update records.  2. Essential hypertension, benign B/p well controlled. - continue on metoprolol,lisinopril,Hydralazine and amlodipine   - TSH; Future - COMPLETE METABOLIC PANEL WITH GFR; Future - CBC with Differential/Platelet; Future  3. Mixed hyperlipidemia Continue on atorvastatin  - Lipid panel; Future  4. Seizure ([place]) No recent seizure activity  Continue on Keppra   5. Abnormality of gait Has walker but forgets to use it at times. Fall and safety precaution.  6. Current moderate episode of major depressive disorder, unspecified whether recurrent (HCC) Mood stable - continue on sertraline   7. Vascular dementia without behavioral  disturbance, psychotic disturbance, mood disturbance, or anxiety, unspecified dementia severity ([place]) No behavioral issues reported.Require cuing and reminding with ADL's currently in ALF  - continue donepezil and sertraline  - continue supportive care   8. Type II diabetes mellitus with neurological manifestations (HCC) No CBG for review or latest A1C  - check CBG daily   - continue on Metformin  - foot exam up to date  - Has upcoming appointment with ophthalmology 08/24/2022  - Microalbumin / creatinine urine ratio; Future - Hemoglobin A1c; Future  Family/ staff Communication: Reviewed plan of care with patient,sister and daughter   Labs/tests ordered:  - CBC with Differential/Platelet - CMP with eGFR([facility]) - TSH - Hgb A1C - Lipid panel - urine Microalbumin ratio   Next Appointment : Return in about 4 months (around 12/11/2022) for medical mangement of chronic issues.Fasting lab work soon or in one week.AWV.   Sandrea Hughs, NP

## 2022-08-12 NOTE — Patient Instructions (Addendum)
-   Please obtain records for last immunization for Tetanus, shingles,Pneumonia and COVID-19 vaccine  - Apply Cetaphil cream twice daily and as needed

## 2022-08-17 ENCOUNTER — Other Ambulatory Visit: Payer: Medicare HMO

## 2022-08-19 ENCOUNTER — Ambulatory Visit: Payer: Medicare HMO | Admitting: Podiatry

## 2022-08-19 VITALS — BP 138/74

## 2022-08-19 DIAGNOSIS — E1149 Type 2 diabetes mellitus with other diabetic neurological complication: Secondary | ICD-10-CM | POA: Diagnosis not present

## 2022-08-19 DIAGNOSIS — M2042 Other hammer toe(s) (acquired), left foot: Secondary | ICD-10-CM | POA: Diagnosis not present

## 2022-08-19 DIAGNOSIS — M2041 Other hammer toe(s) (acquired), right foot: Secondary | ICD-10-CM | POA: Diagnosis not present

## 2022-08-19 NOTE — Progress Notes (Signed)
Subjective:  Patient ID: Mark Soto, male    DOB: January 24, 1941,  MRN: [id number]  Chief Complaint  Patient presents with   Callouses    82 y.o. male presents with the above complaint.  Patient presents with bilateral hammertoe contracture with underlying diabetes.  Patient states that they are a little bit painful day 1 of prevent them from having sores.  He would like to inquire about diabetic shoes.  He has not seen anyone else prior to seeing me.  Sometimes hurts with ambulation pain scale 7 out of 10 dull achy in nature.   Review of Systems: Negative except as noted in the HPI. Denies N/V/F/Ch.  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   Personal history of colonic adenomas 04/14/2013   Pneumonia 08/13/2020   Seizures ([place]) 06/13/2014   Tinnitus    Right Ear    Current Outpatient Medications:    amLODipine (NORVASC) 5 MG tablet, Take 5 mg by mouth daily., Disp: , Rfl:    aspirin EC 81 MG tablet, Take 81 mg by mouth daily. Swallow whole., Disp: , Rfl:    atorvastatin (LIPITOR) 10 MG tablet, Take 10 mg by mouth at bedtime., Disp: , Rfl:    blood glucose meter kit and supplies KIT, 1 each by Does not apply route daily. Dispense based on patient and insurance preference. Use up to four times daily as directed., Disp: 1 each, Rfl: 11   donepezil (ARICEPT) 10 MG tablet, Take 10 mg by mouth at bedtime., Disp: , Rfl:    ferrous sulfate 325 (65 FE) MG EC tablet, Take 325 mg by mouth 2 (two) times daily., Disp: , Rfl:    guaiFENesin (ROBITUSSIN) 100 MG/5ML liquid, Take 10 mLs by mouth every 4 (four) hours as needed for cough., Disp: , Rfl:    hydrALAZINE (APRESOLINE) 25 MG tablet, Take 25 mg by mouth daily., Disp: , Rfl:    Lancets  Misc. (ACCU-CHEK SOFTCLIX LANCET DEV) KIT, 1 Device by Does not apply route daily., Disp: 1 kit, Rfl: 11   levETIRAcetam (KEPPRA) 500 MG tablet, Take 500 mg by mouth daily., Disp: , Rfl:    lisinopril (ZESTRIL) 10 MG tablet, Take 10 mg by mouth daily., Disp: , Rfl:    metFORMIN (GLUCOPHAGE) 500 MG tablet, Take 500 mg by mouth 2 (two) times daily with a meal., Disp: , Rfl:    metoprolol tartrate (LOPRESSOR) 25 MG tablet, Take 25 mg by mouth daily., Disp: , Rfl:    sertraline (ZOLOFT) 100 MG tablet, Take 100 mg by mouth daily., Disp: , Rfl:   Social History   Tobacco Use  Smoking Status Never  Smokeless Tobacco Never    No Known Allergies Objective:   Vitals:   08/19/22 1429  BP: 138/74   There is no height or weight on file to calculate BMI. Constitutional Well developed. Well nourished.  Vascular Dorsalis pedis pulses palpable bilaterally. Posterior tibial pulses palpable bilaterally. Capillary refill normal to all digits.  No cyanosis or clubbing noted. Pedal hair growth normal.  Neurologic Normal speech. Oriented to person, place, and time. Epicritic sensation to light touch grossly present bilaterally.  Dermatologic Nails within normal limits  Orthopedic:  bilateral hammertoe  contractures noted that are somewhat flexible in nature.  Callus formation noted across the dorsum of the toes. Skin within normal limits   Radiographs: None Assessment:   1. Hammer toes, bilateral   2. Type II diabetes mellitus with neurological manifestations [facility])    Plan:  Patient was evaluated and treated and all questions answered.  Bilateral hammertoe contracture with underlying diabetes -All questions or concerns were discussed with the patient.  Given the nature of contracture in setting of the diabetes he would benefit from diabetic shoes.  He was casted for diabetic shoes.  He states understanding.  If any foot and ankle issues on future he will come back and see me  No follow-ups on  file.

## 2022-08-25 NOTE — Progress Notes (Deleted)
Patient presents to the office today for diabetic shoe and insole measuring.  Patient was measured with brannock device to determine size and width for 1 pair of extra depth shoes and foam casted for 3 pair of insoles.   ABN signed.   Documentation of medical necessity will be sent to patient's treating diabetic doctor to verify and sign.   Patient's diabetic provider: ***  Shoes and insoles will be ordered at that time and patient will be notified for an appointment for fitting when they arrive.   Brannock measurement: ***  Patient shoe selection-   1st   Shoe choice:   ***  2nd  Shoe choice:   ***  Shoe size ordered: ***

## 2022-08-27 ENCOUNTER — Other Ambulatory Visit: Payer: Medicare HMO

## 2022-08-27 DIAGNOSIS — E1149 Type 2 diabetes mellitus with other diabetic neurological complication: Secondary | ICD-10-CM

## 2022-08-27 DIAGNOSIS — M2041 Other hammer toe(s) (acquired), right foot: Secondary | ICD-10-CM

## 2022-08-28 ENCOUNTER — Telehealth: Payer: Self-pay | Admitting: Podiatry

## 2022-08-28 NOTE — Telephone Encounter (Signed)
Sister called this morning asking that we hold off ordering patients diabetic shoes until we find out how much his insurance is going to cover.   I advised her that I would send the prior authorization to [facility] and when they respond I will call her and let her know.   Also advised sister that patient was a no show to his appointment yesterday.     Faxed over prior authorization form and clinical documentation to [facility] at 9:09am  08/28/22

## 2022-08-31 NOTE — Telephone Encounter (Signed)
Left message on voicemail for sister to call back.  Received prior auth back - No Auth required.  Called [employer] depending on patients deductible for his plan, they could cover all or 80%.

## 2022-10-22 ENCOUNTER — Encounter: Payer: Self-pay | Admitting: Podiatry

## 2022-12-16 ENCOUNTER — Ambulatory Visit: Payer: Medicare HMO | Admitting: Family

## 2024-03-17 ENCOUNTER — Other Ambulatory Visit: Payer: Self-pay

## 2024-03-17 ENCOUNTER — Encounter (HOSPITAL_COMMUNITY): Payer: Self-pay

## 2024-03-17 ENCOUNTER — Emergency Department (HOSPITAL_COMMUNITY)

## 2024-03-17 ENCOUNTER — Observation Stay (HOSPITAL_COMMUNITY)
Admission: EM | Admit: 2024-03-17 | Discharge: 2024-03-18 | Disposition: A | Discharge status: Home / Self Care | Attending: Internal Medicine | Admitting: Internal Medicine

## 2024-03-17 DIAGNOSIS — N179 Acute kidney failure, unspecified: Secondary | ICD-10-CM | POA: Diagnosis not present

## 2024-03-17 DIAGNOSIS — U071 COVID-19: Principal | ICD-10-CM | POA: Insufficient documentation

## 2024-03-17 DIAGNOSIS — I10 Essential (primary) hypertension: Secondary | ICD-10-CM | POA: Diagnosis not present

## 2024-03-17 DIAGNOSIS — E119 Type 2 diabetes mellitus without complications: Secondary | ICD-10-CM | POA: Diagnosis not present

## 2024-03-17 DIAGNOSIS — E785 Hyperlipidemia, unspecified: Secondary | ICD-10-CM | POA: Diagnosis not present

## 2024-03-17 DIAGNOSIS — F39 Unspecified mood [affective] disorder: Secondary | ICD-10-CM | POA: Insufficient documentation

## 2024-03-17 DIAGNOSIS — Z79899 Other long term (current) drug therapy: Secondary | ICD-10-CM | POA: Diagnosis not present

## 2024-03-17 DIAGNOSIS — E860 Dehydration: Secondary | ICD-10-CM

## 2024-03-17 DIAGNOSIS — R2689 Other abnormalities of gait and mobility: Secondary | ICD-10-CM | POA: Diagnosis not present

## 2024-03-17 DIAGNOSIS — F0150 Vascular dementia without behavioral disturbance: Secondary | ICD-10-CM | POA: Diagnosis not present

## 2024-03-17 DIAGNOSIS — R531 Weakness: Secondary | ICD-10-CM | POA: Diagnosis present

## 2024-03-17 LAB — COMPREHENSIVE METABOLIC PANEL WITH GFR
ALT: 14 U/L (ref 0–44)
AST: 16 U/L (ref 15–41)
Albumin: 3.8 g/dL (ref 3.5–5.0)
Alkaline Phosphatase: 73 U/L (ref 38–126)
Anion gap: 10 (ref 5–15)
BUN: 16 mg/dL (ref 8–23)
CO2: 26 mmol/L (ref 22–32)
Calcium: 9.2 mg/dL (ref 8.9–10.3)
Chloride: 103 mmol/L (ref 98–111)
Creatinine, Ser: 1.29 mg/dL — ABNORMAL HIGH (ref 0.61–1.24)
GFR, Estimated: 56 mL/min — ABNORMAL LOW (ref >60)
Glucose, Bld: 114 mg/dL — ABNORMAL HIGH (ref 70–99)
Potassium: 4.2 mmol/L (ref 3.5–5.1)
Sodium: 139 mmol/L (ref 135–145)
Total Bilirubin: 0.7 mg/dL (ref 0.0–1.2)
Total Protein: 7.8 g/dL (ref 6.5–8.1)

## 2024-03-17 LAB — CG4 I-STAT (LACTIC ACID): Lactic Acid, Venous: 2.2 mmol/L (ref 0.5–1.9)

## 2024-03-17 LAB — CBC WITH DIFFERENTIAL/PLATELET
Abs Immature Granulocytes: 0.02 K/uL (ref 0.00–0.07)
Basophils Absolute: 0 K/uL (ref 0.0–0.1)
Basophils Relative: 0 %
Eosinophils Absolute: 0.1 K/uL (ref 0.0–0.5)
Eosinophils Relative: 2 %
HCT: 39.3 % (ref 39.0–52.0)
Hemoglobin: 12 g/dL — ABNORMAL LOW (ref 13.0–17.0)
Immature Granulocytes: 0 %
Lymphocytes Relative: 12 %
Lymphs Abs: 0.9 K/uL (ref 0.7–4.0)
MCH: 23.8 pg — ABNORMAL LOW (ref 26.0–34.0)
MCHC: 30.5 g/dL (ref 30.0–36.0)
MCV: 78 fL — ABNORMAL LOW (ref 80.0–100.0)
Monocytes Absolute: 0.6 K/uL (ref 0.1–1.0)
Monocytes Relative: 8 %
Neutro Abs: 6.2 K/uL (ref 1.7–7.7)
Neutrophils Relative %: 78 %
Platelets: 131 K/uL — ABNORMAL LOW (ref 150–400)
RBC: 5.04 MIL/uL (ref 4.22–5.81)
RDW: 17.3 % — ABNORMAL HIGH (ref 11.5–15.5)
WBC: 7.9 K/uL (ref 4.0–10.5)
nRBC: 0 % (ref 0.0–0.2)

## 2024-03-17 LAB — I-STAT CG4 LACTIC ACID, ED
Lactic Acid, Venous: 2 mmol/L (ref 0.5–1.9)
Lactic Acid, Venous: 2.4 mmol/L (ref 0.5–1.9)

## 2024-03-17 LAB — LIPASE, BLOOD: Lipase: 30 U/L (ref 11–51)

## 2024-03-17 LAB — RESP PANEL BY RT-PCR (RSV, FLU A&B, COVID)  RVPGX2
Influenza A by PCR: NEGATIVE
Influenza B by PCR: NEGATIVE
Resp Syncytial Virus by PCR: NEGATIVE
SARS Coronavirus 2 by RT PCR: POSITIVE — AB

## 2024-03-17 LAB — TROPONIN I (HIGH SENSITIVITY): Troponin I (High Sensitivity): 18 ng/L — ABNORMAL HIGH (ref <18)

## 2024-03-17 LAB — GLUCOSE, CAPILLARY: Glucose-Capillary: 109 mg/dL — ABNORMAL HIGH (ref 70–99)

## 2024-03-17 LAB — PROTIME-INR
INR: 1 (ref 0.8–1.2)
Prothrombin Time: 13.6 s (ref 11.4–15.2)

## 2024-03-17 MED ORDER — ONDANSETRON HCL 4 MG PO TABS: 4.0000 mg | ORAL_TABLET | Freq: Four times a day (QID) | ORAL | Status: DC | PRN

## 2024-03-17 MED ORDER — INSULIN ASPART 100 UNIT/ML IJ SOLN: 0.0000 [IU] | Freq: Every day | INTRAMUSCULAR | Status: DC

## 2024-03-17 MED ORDER — ENOXAPARIN SODIUM 40 MG/0.4ML IJ SOSY
40.0000 mg | PREFILLED_SYRINGE | INTRAMUSCULAR | Status: DC
  Administered 2024-03-17: 40 mg via SUBCUTANEOUS
  Filled 2024-03-17: qty 0.4

## 2024-03-17 MED ORDER — ATORVASTATIN CALCIUM 10 MG PO TABS: 10.0000 mg | ORAL_TABLET | Freq: Every day | ORAL | Status: DC

## 2024-03-17 MED ORDER — DM-GUAIFENESIN ER 30-600 MG PO TB12
1.0000 | ORAL_TABLET | Freq: Two times a day (BID) | ORAL | Status: DC
  Administered 2024-03-18: 1 via ORAL
  Filled 2024-03-17: qty 1

## 2024-03-17 MED ORDER — ACETAMINOPHEN 650 MG RE SUPP: 650.0000 mg | Freq: Four times a day (QID) | RECTAL | Status: DC | PRN

## 2024-03-17 MED ORDER — SENNOSIDES-DOCUSATE SODIUM 8.6-50 MG PO TABS: 1.0000 | ORAL_TABLET | Freq: Every evening | ORAL | Status: DC | PRN

## 2024-03-17 MED ORDER — AMLODIPINE BESYLATE 5 MG PO TABS
5.0000 mg | ORAL_TABLET | Freq: Every day | ORAL | Status: DC
  Administered 2024-03-18: 5 mg via ORAL
  Filled 2024-03-17: qty 1

## 2024-03-17 MED ORDER — ONDANSETRON HCL 4 MG/2ML IJ SOLN: 4.0000 mg | Freq: Four times a day (QID) | INTRAMUSCULAR | Status: DC | PRN

## 2024-03-17 MED ORDER — BISACODYL 5 MG PO TBEC
5.0000 mg | DELAYED_RELEASE_TABLET | Freq: Every day | ORAL | Status: DC | PRN
Start: 2024-03-17 — End: 2024-03-18

## 2024-03-17 MED ORDER — ACETAMINOPHEN 325 MG PO TABS: 650.0000 mg | ORAL_TABLET | Freq: Four times a day (QID) | ORAL | Status: DC | PRN

## 2024-03-17 MED ORDER — LACTATED RINGERS IV BOLUS
1000.0000 mL | Freq: Once | INTRAVENOUS | Status: AC
  Administered 2024-03-17: 1000 mL via INTRAVENOUS

## 2024-03-17 MED ORDER — METOPROLOL TARTRATE 25 MG PO TABS
25.0000 mg | ORAL_TABLET | Freq: Two times a day (BID) | ORAL | Status: DC
  Administered 2024-03-17 – 2024-03-18 (×2): 25 mg via ORAL
  Filled 2024-03-17 (×2): qty 1

## 2024-03-17 MED ORDER — SERTRALINE HCL 50 MG PO TABS
150.0000 mg | ORAL_TABLET | Freq: Every day | ORAL | Status: DC
  Administered 2024-03-18: 150 mg via ORAL
  Filled 2024-03-17: qty 3

## 2024-03-17 MED ORDER — GUAIFENESIN 100 MG/5ML PO LIQD
5.0000 mL | ORAL | Status: DC | PRN
  Administered 2024-03-17: 5 mL via ORAL
  Filled 2024-03-17: qty 10

## 2024-03-17 MED ORDER — DONEPEZIL HCL 5 MG PO TABS: 10.0000 mg | ORAL_TABLET | Freq: Every day | ORAL | Status: DC

## 2024-03-17 MED ORDER — ASPIRIN 81 MG PO TBEC
81.0000 mg | DELAYED_RELEASE_TABLET | Freq: Every day | ORAL | Status: DC
  Administered 2024-03-18: 81 mg via ORAL
  Filled 2024-03-17: qty 1

## 2024-03-17 MED ORDER — HYDRALAZINE HCL 25 MG PO TABS
25.0000 mg | ORAL_TABLET | Freq: Every day | ORAL | Status: DC
  Administered 2024-03-18: 25 mg via ORAL
  Filled 2024-03-17: qty 1

## 2024-03-17 MED ORDER — METFORMIN HCL 500 MG PO TABS: 500.0000 mg | ORAL_TABLET | Freq: Two times a day (BID) | ORAL | Status: DC

## 2024-03-17 MED ORDER — INSULIN ASPART 100 UNIT/ML IJ SOLN: 0.0000 [IU] | Freq: Three times a day (TID) | INTRAMUSCULAR | Status: DC

## 2024-03-17 MED ORDER — ENSURE PLUS HIGH PROTEIN PO LIQD: 237.0000 mL | Freq: Three times a day (TID) | ORAL | Status: DC

## 2024-03-17 MED ORDER — SODIUM CHLORIDE 0.9 % IV SOLN: INTRAVENOUS | Status: DC

## 2024-03-17 NOTE — ED Triage Notes (Signed)
 Pt BIB EMS from [facility] for weakness. Pt usually uses a walker on his own but has not today. Pt has also vomited today.   100.4 152/72 68 hr 20 capnography 24 rr 95% RA 258 cbg  Hx dementia (A&Ox3)

## 2024-03-17 NOTE — ED Provider Notes (Signed)
 [facility] Provider Note   CSN: [id number] Arrival date & time: 03/17/24  1532     Patient presents with: Weakness and Fever   Denise Bramblett is a 83 y.o. male.   This is a 82 year old male presenting emergency department for fever and weakness.  Had some vomiting this morning.  Typically ambulatory with assistance with walker, but today was unable to ambulate secondary to his weakness.  He has dementia and his baseline per EMS reports.  He has no current complaint, although is only oriented to person and place, not time.   Weakness Associated symptoms: fever   Fever      Prior to Admission medications   Medication Sig Start Date End Date Taking? Authorizing Provider  amLODipine  (NORVASC ) 5 MG tablet Take 5 mg by mouth daily.    [provider]  aspirin  EC 81 MG tablet Take 81 mg by mouth daily. Swallow whole.    [provider]  atorvastatin  (LIPITOR ) 10 MG tablet Take 10 mg by mouth at bedtime. 07/18/20   [provider]  blood glucose meter kit and supplies KIT 1 each by Does not apply route daily. Dispense based on patient and insurance preference. Use up to four times daily as directed. 08/12/22   Ngetich, Dinah C, NP  donepezil  (ARICEPT ) 10 MG tablet Take 10 mg by mouth at bedtime. 08/05/20   [provider]  ferrous sulfate  325 (65 FE) MG EC tablet Take 325 mg by mouth 2 (two) times daily.    [provider]  guaiFENesin  (ROBITUSSIN) 100 MG/5ML liquid Take 10 mLs by mouth every 4 (four) hours as needed for cough. 07/18/20   [provider]  hydrALAZINE  (APRESOLINE ) 25 MG tablet Take 25 mg by mouth daily.    [provider]  Lancets Misc. (ACCU-CHEK SOFTCLIX LANCET DEV) KIT 1 Device by Does not apply route daily. 08/12/22   Ngetich, Dinah C, NP  levETIRAcetam  (KEPPRA ) 500 MG tablet Take 500 mg by mouth daily.    [provider]  lisinopril (ZESTRIL) 10 MG tablet Take  10 mg by mouth daily.    [provider]  metFORMIN  (GLUCOPHAGE ) 500 MG tablet Take 500 mg by mouth 2 (two) times daily with a meal.    [provider]  metoprolol  tartrate (LOPRESSOR ) 25 MG tablet Take 25 mg by mouth daily.    [provider]  sertraline  (ZOLOFT ) 100 MG tablet Take 100 mg by mouth daily. 08/05/20   [provider]    Allergies: Patient has no known allergies.    Review of Systems  Constitutional:  Positive for fever.  Neurological:  Positive for weakness.    Updated Vital Signs BP (!) 173/89   Pulse 81   Temp 98.2 F (36.8 C) (Oral)   Resp 16   SpO2 98%   Physical Exam Vitals and nursing note reviewed.  HENT:     Head: Normocephalic and atraumatic.     Mouth/Throat:     Mouth: Mucous membranes are moist.  Eyes:     Conjunctiva/sclera: Conjunctivae normal.  Cardiovascular:     Rate and Rhythm: Normal rate and regular rhythm.  Pulmonary:     Effort: Pulmonary effort is normal.     Breath sounds: Normal breath sounds.  Abdominal:     General: Abdomen is flat. There is no distension.     Tenderness: There is no abdominal tenderness. There is no guarding or rebound.  Musculoskeletal:  Right lower leg: No edema.     Left lower leg: No edema.  Skin:    General: Skin is warm.     Capillary Refill: Capillary refill takes less than 2 seconds.  Neurological:     Mental Status: He is alert.     Motor: Weakness present.     Comments: Global weakness  Psychiatric:        Mood and Affect: Mood normal.        Behavior: Behavior normal.     (all labs ordered are listed, but only abnormal results are displayed) Labs Reviewed  RESP PANEL BY RT-PCR (RSV, FLU A&B, COVID)  [id number] - Abnormal; Notable for the following components:      Result Value   SARS Coronavirus 2 by RT PCR POSITIVE (*)    All other components within normal limits  COMPREHENSIVE METABOLIC PANEL WITH GFR - Abnormal; Notable for the following components:    Glucose, Bld 114 (*)    Creatinine, Ser 1.29 (*)    GFR, Estimated 56 (*)    All other components within normal limits  CBC WITH DIFFERENTIAL/PLATELET - Abnormal; Notable for the following components:   Hemoglobin 12.0 (*)    MCV 78.0 (*)    MCH 23.8 (*)    RDW 17.3 (*)    Platelets 131 (*)    All other components within normal limits  I-STAT CG4 LACTIC ACID, ED - Abnormal; Notable for the following components:   Lactic Acid, Venous 2.0 (*)    All other components within normal limits  I-STAT CG4 LACTIC ACID, ED - Abnormal; Notable for the following components:   Lactic Acid, Venous 2.4 (*)    All other components within normal limits  CG4 I-STAT (LACTIC ACID) - Abnormal; Notable for the following components:   Lactic Acid, Venous 2.2 (*)    All other components within normal limits  TROPONIN I (HIGH SENSITIVITY) - Abnormal; Notable for the following components:   Troponin I (High Sensitivity) 18 (*)    All other components within normal limits  CULTURE, BLOOD (ROUTINE X 2)  CULTURE, BLOOD (ROUTINE X 2)  PROTIME-INR  LIPASE, BLOOD  CBC WITH DIFFERENTIAL/PLATELET  URINALYSIS, W/ REFLEX TO CULTURE (INFECTION SUSPECTED)  BASIC METABOLIC PANEL WITH GFR  CBC  HEMOGLOBIN A1C  I-STAT CG4 LACTIC ACID, ED  I-STAT CG4 LACTIC ACID, ED  TROPONIN I (HIGH SENSITIVITY)    EKG: EKG Interpretation Date/Time:  Friday March 17 2024 16:18:35 EDT Ventricular Rate:  78 PR Interval:  222 QRS Duration:  100 QT Interval:  383 QTC Calculation: 437 R Axis:   -10  Text Interpretation: Sinus rhythm Prolonged PR interval Abnormal R-wave progression, early transition LVH with secondary repolarization abnormality Confirmed by Neysa Clap [phone]) on 03/17/2024 5:42:30 PM  Radiology: CT Head Wo Contrast Result Date: 03/17/2024 CLINICAL DATA:  Neuro deficit, acute, stroke suspected EXAM: CT HEAD WITHOUT CONTRAST TECHNIQUE: Contiguous axial images were obtained from the base of the skull through  the vertex without intravenous contrast. RADIATION DOSE REDUCTION: This exam was performed according to the departmental dose-optimization program which includes automated exposure control, adjustment of the mA and/or kV according to patient size and/or use of iterative reconstruction technique. COMPARISON:  CT head 08/13/2020 FINDINGS: Brain: Cerebral ventricle sizes are concordant with the degree of cerebral volume loss. Patchy and confluent areas of decreased attenuation are noted throughout the deep and periventricular white matter of the cerebral hemispheres bilaterally, compatible with chronic microvascular ischemic disease. No evidence of large-territorial  acute infarction. No parenchymal hemorrhage. No mass lesion. No extra-axial collection. No mass effect or midline shift. No hydrocephalus. Basilar cisterns are patent. Vascular: No hyperdense vessel. Skull: No acute fracture or focal lesion. Sinuses/Orbits: Paranasal sinuses and mastoid air cells are clear. The orbits are unremarkable. Other: None. IMPRESSION: Cerebral ventricle sizes are concordant with the degree of cerebral volume loss. Electronically Signed   By: Morgane  Naveau M.D.   On: 03/17/2024 17:49   DG Chest Port 1 View Result Date: 03/17/2024 CLINICAL DATA:  Questionable sepsis - evaluate for abnormality. Weakness, vomiting EXAM: PORTABLE CHEST 1 VIEW COMPARISON:  08/13/2020 FINDINGS: Heart and mediastinal contours are within normal limits. No focal opacities or effusions. No acute bony abnormality. IMPRESSION: No active disease. Electronically Signed   By: Franky Crease M.D.   On: 03/17/2024 16:33     Procedures   Medications Ordered in the ED  dextromethorphan -guaiFENesin  (MUCINEX  DM) 30-600 MG per 12 hr tablet 1 tablet (1 tablet Oral Not Given 03/17/24 2234)  guaiFENesin  (ROBITUSSIN) 100 MG/5ML liquid 5 mL (5 mLs Oral Given 03/17/24 2229)  0.9 %  sodium chloride  infusion ( Intravenous New Bag/Given 03/17/24 2221)  feeding supplement  (ENSURE PLUS HIGH PROTEIN) liquid 237 mL (has no administration in time range)  acetaminophen  (TYLENOL ) tablet 650 mg (has no administration in time range)    Or  acetaminophen  (TYLENOL ) suppository 650 mg (has no administration in time range)  senna-docusate (Senokot-S) tablet 1 tablet (has no administration in time range)  bisacodyl  (DULCOLAX) EC tablet 5 mg (has no administration in time range)  ondansetron  (ZOFRAN ) tablet 4 mg (has no administration in time range)    Or  ondansetron  (ZOFRAN ) injection 4 mg (has no administration in time range)  enoxaparin  (LOVENOX ) injection 40 mg (has no administration in time range)  aspirin  EC tablet 81 mg (has no administration in time range)  amLODipine  (NORVASC ) tablet 5 mg (has no administration in time range)  atorvastatin  (LIPITOR ) tablet 10 mg (has no administration in time range)  hydrALAZINE  (APRESOLINE ) tablet 25 mg (has no administration in time range)  metoprolol  tartrate (LOPRESSOR ) tablet 25 mg (has no administration in time range)  donepezil  (ARICEPT ) tablet 10 mg (has no administration in time range)  sertraline  (ZOLOFT ) tablet 150 mg (has no administration in time range)  insulin  aspart (novoLOG ) injection 0-15 Units (has no administration in time range)  insulin  aspart (novoLOG ) injection 0-5 Units (has no administration in time range)  lactated ringers  bolus 1,000 mL (0 mLs Intravenous Stopped 03/17/24 1856)  lactated ringers  bolus 1,000 mL (0 mLs Intravenous Stopped 03/17/24 2219)    Clinical Course as of 03/17/24 2303  Fri Mar 17, 2024  1807 SARS Coronavirus 2 by RT PCR(!): POSITIVE [TY]    Clinical Course User Index [TY] Neysa Caron PARAS, DO                                 Medical Decision Making This is an 83 year old male history of dementia, hypertension, prior stroke presenting emergency department for generalized weakness.  EMS reported fever 100.5.  He has been nontachycardic normotensive maintaining oxygen saturation on  room air.  Workup largely reassuring mildly elevated lactate, receiving IV fluids.  Minor elevation in creatinine, but no other significant metabolic derangements.  No transaminitis suggest possibility of disease.  Lipase is normal.  Pancreatitis unlikely.  Initial troponin of 18, however EKG without ST segment changes to indicate ischemia on  my independent interpretation.  He is at baseline anemia.  He is positive for COVID.  Given new his weakness in the setting of his COVID we will admit for supportive care.  Amount and/or Complexity of Data Reviewed External Data Reviewed:     Details: He is on donepezil , Keppra  and sertraline .  Labs: ordered. Decision-making details documented in ED Course. Radiology: ordered and independent interpretation performed.    Details: CT of the head without obvious acute intracranial pathology Discussion of management or test interpretation with external provider(s): Hospitalist for admission  Risk Decision regarding hospitalization.        Final diagnoses:  COVID-19    ED Discharge Orders     None          Neysa Caron PARAS, DO 03/17/24 2303

## 2024-03-17 NOTE — ED Notes (Addendum)
 Patient linen and clothing changed

## 2024-03-17 NOTE — H&P (Signed)
 History and Physical  Curvin Hunger FMW:[id number] DOB: Jan 11, 1941 DOA: 03/17/2024  PCP: Pia Kerney SQUIBB, MD   Chief Complaint: Generalized weakness  HPI: Mark Soto is a 83 y.o. male with medical history significant for vascular dementia, HTN, HLD, T2DM, BPH, seizure and CVA who presented from [facility] assisted living for evaluation of generalized weakness. Per daughter, patient normally ambulates with a walker but was unable to get up to walk today due to significant weakness. He has also had a dry cough and 1 episode of emesis earlier today. A staff member at the facility tested positive for COVID this week however patient tested negative x 2. At bedside, patient reports feeling weak and endorses a dry cough and dry mouth but denies any nausea, fever, chills, abdominal pain, chest pain, dizziness, headache, shortness of breath or dysuria. Of note, daughter states patient has urinary incontinence and often wears adult diapers.  ED Course: Initial vitals show patient afebrile but hypertensive with SBP in the 160s to 180s, SpO2 98% on room air. Initial labs significant for creatinine 1.29, troponin 18, lactic acid 2.0-2.4, WBC 7.9, Hgb 12.0, platelet 131, INR 1.0, glucose 114, negative flu and RSV test, positive COVID test. EKG shows sinus rhythm with LVH. CXR shows no active disease. CT head with no acute intracranial abnormalities.  Pt received IV LR 1 L bolus.  TRH was consulted for admission.   Review of Systems: Please see HPI for pertinent positives and negatives. A complete 10 system review of systems are otherwise negative.  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   Personal history of colonic adenomas 04/14/2013   Pneumonia  08/13/2020   Seizures (HCC) 06/13/2014   Tinnitus    Right Ear   Past Surgical History:  Procedure Laterality Date   LOOP RECORDER IMPLANT N/A 06/19/2014   Procedure: LOOP RECORDER IMPLANT;  Surgeon: Danelle LELON Birmingham, MD;  Location: [facility] CATH LAB;  Service: Cardiovascular;  Laterality: N/A;   none     TEE WITHOUT CARDIOVERSION N/A 06/15/2014   Procedure: TRANSESOPHAGEAL ECHOCARDIOGRAM (TEE);  Surgeon: Vina Okey GAILS, MD;  Location: [facility] ENDOSCOPY;  Service: Cardiovascular;  Laterality: N/A;   Social History:  reports that he has never smoked. He has never used smokeless tobacco. He reports that he does not drink alcohol and does not use drugs.  No Known Allergies  Family History  Problem Relation Age of Onset   Breast cancer Mother    Liver cancer Mother    Lung cancer Mother    COPD Father    Hypertension Sister    Hypertension Brother    Hypertension Sister    Colon cancer Neg Hx    Colon polyps Neg Hx    Stomach cancer Neg Hx      Prior to Admission medications   Medication Sig Start Date End Date Taking? Authorizing Provider  amLODipine  (NORVASC ) 5 MG tablet Take 5 mg by mouth daily.    [provider]  aspirin  EC 81 MG tablet Take 81 mg by mouth daily. Swallow whole.    [provider]  atorvastatin  (LIPITOR ) 10 MG tablet Take 10 mg by mouth at bedtime. 07/18/20   [provider]  blood  glucose meter kit and supplies KIT 1 each by Does not apply route daily. Dispense based on patient and insurance preference. Use up to four times daily as directed. 08/12/22   Ngetich, Dinah C, NP  donepezil  (ARICEPT ) 10 MG tablet Take 10 mg by mouth at bedtime. 08/05/20   [provider]  ferrous sulfate  325 (65 FE) MG EC tablet Take 325 mg by mouth 2 (two) times daily.    [provider]  guaiFENesin  (ROBITUSSIN) 100 MG/5ML liquid Take 10 mLs by mouth every 4 (four) hours as needed for cough. 07/18/20   [provider]  hydrALAZINE  (APRESOLINE ) 25  MG tablet Take 25 mg by mouth daily.    [provider]  Lancets Misc. (ACCU-CHEK SOFTCLIX LANCET DEV) KIT 1 Device by Does not apply route daily. 08/12/22   Ngetich, Dinah C, NP  levETIRAcetam  (KEPPRA ) 500 MG tablet Take 500 mg by mouth daily.    [provider]  lisinopril (ZESTRIL) 10 MG tablet Take 10 mg by mouth daily.    [provider]  metFORMIN  (GLUCOPHAGE ) 500 MG tablet Take 500 mg by mouth 2 (two) times daily with a meal.    [provider]  metoprolol  tartrate (LOPRESSOR ) 25 MG tablet Take 25 mg by mouth daily.    [provider]  sertraline  (ZOLOFT ) 100 MG tablet Take 100 mg by mouth daily. 08/05/20   [provider]    Physical Exam: BP (!) 173/89   Pulse 81   Temp 98.2 F (36.8 C) (Oral)   Resp 16   SpO2 98%  General: Pleasant, weak appearing elderly man laying in bed. No acute distress. HEENT: [place]/AT. Anicteric sclera. Dentures in place. Dry mucous membrane. CV: RRR. No murmurs, rubs, or gallops. No LE edema Pulmonary: Lungs CTAB. Normal effort. No wheezing or rales. Abdominal: Soft, nontender, nondistended. Normal bowel sounds. Extremities: Palpable radial and DP pulses. Normal ROM. Skin: Warm and dry. No obvious rash or lesions. Decreased skin turgor. Neuro: A&Ox3. Moves all extremities. Normal sensation to light touch. No focal deficit. Psych: Normal mood and affect          Labs on Admission:  Basic Metabolic Panel: Recent Labs  Lab 03/17/24 1646  NA 139  K 4.2  CL 103  CO2 26  GLUCOSE 114*  BUN 16  CREATININE 1.29*  CALCIUM  9.2   Liver Function Tests: Recent Labs  Lab 03/17/24 1646  AST 16  ALT 14  ALKPHOS 73  BILITOT 0.7  PROT 7.8  ALBUMIN 3.8   Recent Labs  Lab 03/17/24 1646  LIPASE 30   No results for input(s): AMMONIA in the last 168 hours. CBC: Recent Labs  Lab 03/17/24 1704  WBC 7.9  NEUTROABS 6.2  HGB 12.0*  HCT 39.3  MCV 78.0*  PLT 131*   Cardiac Enzymes: No results  for input(s): CKTOTAL, CKMB, CKMBINDEX, TROPONINI in the last 168 hours. BNP (last 3 results) No results for input(s): BNP in the last 8760 hours.  ProBNP (last 3 results) No results for input(s): PROBNP in the last 8760 hours.  CBG: No results for input(s): GLUCAP in the last 168 hours.  Radiological Exams on Admission: CT Head Wo Contrast Result Date: 03/17/2024 CLINICAL DATA:  Neuro deficit, acute, stroke suspected EXAM: CT HEAD WITHOUT CONTRAST TECHNIQUE: Contiguous axial images were obtained from the base of the skull through the vertex without intravenous contrast. RADIATION DOSE REDUCTION: This exam was performed according to the departmental dose-optimization program which includes automated exposure  control, adjustment of the mA and/or kV according to patient size and/or use of iterative reconstruction technique. COMPARISON:  CT head 08/13/2020 FINDINGS: Brain: Cerebral ventricle sizes are concordant with the degree of cerebral volume loss. Patchy and confluent areas of decreased attenuation are noted throughout the deep and periventricular white matter of the cerebral hemispheres bilaterally, compatible with chronic microvascular ischemic disease. No evidence of large-territorial acute infarction. No parenchymal hemorrhage. No mass lesion. No extra-axial collection. No mass effect or midline shift. No hydrocephalus. Basilar cisterns are patent. Vascular: No hyperdense vessel. Skull: No acute fracture or focal lesion. Sinuses/Orbits: Paranasal sinuses and mastoid air cells are clear. The orbits are unremarkable. Other: None. IMPRESSION: Cerebral ventricle sizes are concordant with the degree of cerebral volume loss. Electronically Signed   By: Morgane  Naveau M.D.   On: 03/17/2024 17:49   DG Chest Port 1 View Result Date: 03/17/2024 CLINICAL DATA:  Questionable sepsis - evaluate for abnormality. Weakness, vomiting EXAM: PORTABLE CHEST 1 VIEW COMPARISON:  08/13/2020 FINDINGS: Heart  and mediastinal contours are within normal limits. No focal opacities or effusions. No acute bony abnormality. IMPRESSION: No active disease. Electronically Signed   By: Franky Crease M.D.   On: 03/17/2024 16:33   Assessment/Plan Ulmer Degen is a 83 y.o. male with medical history significant for vascular dementia, HTN, HLD, T2DM, BPH, seizure and CVA who presented from [facility] assisted living for evaluation of generalized weakness and admitted for COVID infection  # COVID-19 infection - Presented with dry cough and generalized weakness from ALF - Positive COVID-19 test in the setting of recent exposure at his ALF - Weak-appearing on exam but hemodynamically stable, no respiratory distress, stable SpO2 on room air - Admit for observation and supportive care - Start Mucinex  twice daily with as needed Robitussin for cough - Incentive spirometer, flutter valve - Supplemental O2 as needed - Airborne and contact precautions  # AKI, mild # Dehydration - Creatinine elevated to 1.29, from normal baseline - Likely secondary to dehydration in the setting of COVID infection - Start IV NS at 125 cc/hr for 1 day - Trend renal function - Avoid nephrotoxic meds  # Generalized weakness - In the setting of acute COVID infection - PT/OT eval and treat - Protein supplementation - Fall precautions  # HTN - BP elevated with SBP in the 160s to 180s - Continue amlodipine , metoprolol  and hydralazine  - Hold lisinopril until improvement in kidney function  # T2DM - Blood glucose of 114 on admission - On metformin  at home, hold for now - SSI with meals, CBG monitoring - Follow-up A1c  # HLD # CVA - Continue aspirin  and atorvastatin   # Vascular dementia - Currently alert and oriented x 3 - Continue Aricept   # Mood disorder - Continue home sertraline   DVT prophylaxis: Lovenox      Code Status: Full Code  Consults called: None  Family Communication: Discussed admission with daughter  over the phone  Severity of Illness: The appropriate patient status for this patient is OBSERVATION. Observation status is judged to be reasonable and necessary in order to provide the required intensity of service to ensure the patient's safety. The patient's presenting symptoms, physical exam findings, and initial radiographic and laboratory data in the context of their medical condition is felt to place them at decreased risk for further clinical deterioration. Furthermore, it is anticipated that the patient will be medically stable for discharge from the hospital within 2 midnights of admission.   Level of care: Telemetry  This record has been created using Conservation officer, historic buildings. Errors have been sought and corrected, but may not always be located. Such creation errors do not reflect on the standard of care.   Lou Claretta HERO, MD 03/17/2024, 10:35 PM [facility] Pager: [phone] Isaiah 41:10   If 7PM-7AM, please contact night-coverage [email or web address] Password [email or web address]

## 2024-03-18 DIAGNOSIS — U071 COVID-19: Secondary | ICD-10-CM | POA: Diagnosis not present

## 2024-03-18 LAB — GLUCOSE, CAPILLARY
Glucose-Capillary: 109 mg/dL — ABNORMAL HIGH (ref 70–99)
Glucose-Capillary: 98 mg/dL (ref 70–99)
Glucose-Capillary: 97 mg/dL (ref 70–99)

## 2024-03-18 LAB — BASIC METABOLIC PANEL WITH GFR
Anion gap: 11 (ref 5–15)
BUN: 13 mg/dL (ref 8–23)
CO2: 24 mmol/L (ref 22–32)
Calcium: 8.5 mg/dL — ABNORMAL LOW (ref 8.9–10.3)
Chloride: 104 mmol/L (ref 98–111)
Creatinine, Ser: 0.99 mg/dL (ref 0.61–1.24)
GFR, Estimated: >60 mL/min (ref >60)
Glucose, Bld: 116 mg/dL — ABNORMAL HIGH (ref 70–99)
Potassium: 3.7 mmol/L (ref 3.5–5.1)
Sodium: 139 mmol/L (ref 135–145)

## 2024-03-18 LAB — CBC
HCT: 35.8 % — ABNORMAL LOW (ref 39.0–52.0)
Hemoglobin: 10.7 g/dL — ABNORMAL LOW (ref 13.0–17.0)
MCH: 23 pg — ABNORMAL LOW (ref 26.0–34.0)
MCHC: 29.9 g/dL — ABNORMAL LOW (ref 30.0–36.0)
MCV: 77 fL — ABNORMAL LOW (ref 80.0–100.0)
Platelets: 116 K/uL — ABNORMAL LOW (ref 150–400)
RBC: 4.65 MIL/uL (ref 4.22–5.81)
RDW: 17.1 % — ABNORMAL HIGH (ref 11.5–15.5)
WBC: 7 K/uL (ref 4.0–10.5)
nRBC: 0 % (ref 0.0–0.2)

## 2024-03-18 LAB — TROPONIN I (HIGH SENSITIVITY): Troponin I (High Sensitivity): 29 ng/L — ABNORMAL HIGH (ref <18)

## 2024-03-18 NOTE — TOC Transition Note (Addendum)
 Transition of Care [facility]) - Discharge Note   Patient Details  Name: Mark Soto MRN: [id number] Date of Birth: 1940/12/26  Transition of Care [facility]) CM/SW Contact:  Heather DELENA Saltness, LCSW Phone Number: 03/18/2024, 3:44 PM   Clinical Narrative:    Pt discharging back to [facility]F today. Pt recommended for [facility] PT/OT services upon discharge. HH PT/OT set up with [facility]. D/C packet placed in pt's chart at RN station. RN to call report to [phone]. Pt's daughter requested [facility] transport pt back to facility due to history of dementia and concern for pt's safety during transport. [facility] called at 2:18 PM. Pt and daughter in agreement with discharge plan. No further TOC needs at this time.   Final next level of care: Rest Home Barriers to Discharge: Barriers Resolved   Patient Goals and CMS Choice Patient states their goals for this hospitalization and ongoing recovery are:: To return to [facility]          Discharge Placement  [facility]              Patient to be transferred to facility by: [facility] Name of family member notified: Rana Adorno, daughter Patient and family notified of of transfer: 03/18/24  Discharge Plan and Services Additional resources added to the After Visit Summary for  Home Health                DME Arranged: N/A DME Agency: NA       HH Arranged: PT, OT with [facility] Agency: [facility] Date [facility] Agency Contacted: 03/18/24 Time HH Agency Contacted: 1541 Representative spoke with at [facility] Agency: [place]  Social Drivers of Health (SDOH) Interventions SDOH Screenings   Transportation Needs: Patient Unable To Answer (03/18/2024)  Utilities: Patient Unable To Answer (03/18/2024)  Social Connections: Patient Unable To Answer (03/18/2024)  Tobacco Use: Low Risk  (03/17/2024)     Readmission Risk Interventions     No data to display          Signed: Heather Saltness, MSW, LCSW Clinical Social  Worker Inpatient Care Management 03/18/2024 3:44 PM

## 2024-03-18 NOTE — Plan of Care (Signed)
  Problem: Education: Goal: Knowledge of risk factors and measures for prevention of condition will improve Outcome: Progressing   Problem: Coping: Goal: Psychosocial and spiritual needs will be supported Outcome: Progressing   Problem: Respiratory: Goal: Will maintain a patent airway Outcome: Progressing Goal: Complications related to the disease process, condition or treatment will be avoided or minimized Outcome: Progressing   Problem: Education: Goal: Knowledge of General Education information will improve Description: Including pain rating scale, medication(s)/side effects and non-pharmacologic comfort measures Outcome: Progressing   Problem: Clinical Measurements: Goal: Ability to maintain clinical measurements within normal limits will improve Outcome: Progressing Goal: Will remain free from infection Outcome: Progressing Goal: Diagnostic test results will improve Outcome: Progressing Goal: Respiratory complications will improve Outcome: Progressing Goal: Cardiovascular complication will be avoided Outcome: Progressing   Problem: Activity: Goal: Risk for activity intolerance will decrease Outcome: Progressing   Problem: Elimination: Goal: Will not experience complications related to bowel motility Outcome: Progressing Goal: Will not experience complications related to urinary retention Outcome: Progressing   Problem: Pain Managment: Goal: General experience of comfort will improve and/or be controlled Outcome: Progressing

## 2024-03-18 NOTE — Plan of Care (Signed)
  Problem: Education: Goal: Knowledge of risk factors and measures for prevention of condition will improve Outcome: Completed/Met   Problem: Coping: Goal: Psychosocial and spiritual needs will be supported Outcome: Completed/Met   Problem: Respiratory: Goal: Will maintain a patent airway Outcome: Completed/Met Goal: Complications related to the disease process, condition or treatment will be avoided or minimized Outcome: Completed/Met   Problem: Education: Goal: Knowledge of General Education information will improve Description: Including pain rating scale, medication(s)/side effects and non-pharmacologic comfort measures Outcome: Completed/Met   Problem: Health Behavior/Discharge Planning: Goal: Ability to manage health-related needs will improve Outcome: Completed/Met   Problem: Clinical Measurements: Goal: Ability to maintain clinical measurements within normal limits will improve Outcome: Completed/Met Goal: Will remain free from infection Outcome: Completed/Met Goal: Diagnostic test results will improve Outcome: Completed/Met Goal: Respiratory complications will improve Outcome: Completed/Met Goal: Cardiovascular complication will be avoided Outcome: Completed/Met   Problem: Activity: Goal: Risk for activity intolerance will decrease Outcome: Completed/Met   Problem: Nutrition: Goal: Adequate nutrition will be maintained Outcome: Completed/Met   Problem: Coping: Goal: Level of anxiety will decrease Outcome: Completed/Met   Problem: Elimination: Goal: Will not experience complications related to bowel motility Outcome: Completed/Met Goal: Will not experience complications related to urinary retention Outcome: Completed/Met   Problem: Pain Managment: Goal: General experience of comfort will improve and/or be controlled Outcome: Completed/Met   Problem: Safety: Goal: Ability to remain free from injury will improve Outcome: Completed/Met   Problem: Skin  Integrity: Goal: Risk for impaired skin integrity will decrease Outcome: Completed/Met   Problem: Education: Goal: Ability to describe self-care measures that may prevent or decrease complications (Diabetes Survival Skills Education) will improve Outcome: Completed/Met Goal: Individualized Educational Video(s) Outcome: Completed/Met   Problem: Coping: Goal: Ability to adjust to condition or change in health will improve Outcome: Completed/Met   Problem: Fluid Volume: Goal: Ability to maintain a balanced intake and output will improve Outcome: Completed/Met   Problem: Health Behavior/Discharge Planning: Goal: Ability to identify and utilize available resources and services will improve Outcome: Completed/Met Goal: Ability to manage health-related needs will improve Outcome: Completed/Met   Problem: Metabolic: Goal: Ability to maintain appropriate glucose levels will improve Outcome: Completed/Met   Problem: Nutritional: Goal: Maintenance of adequate nutrition will improve Outcome: Completed/Met Goal: Progress toward achieving an optimal weight will improve Outcome: Completed/Met   Problem: Skin Integrity: Goal: Risk for impaired skin integrity will decrease Outcome: Completed/Met   Problem: Tissue Perfusion: Goal: Adequacy of tissue perfusion will improve Outcome: Completed/Met

## 2024-03-18 NOTE — Evaluation (Addendum)
 Occupational Therapy Evaluation Patient Details Name: Mark Soto MRN: [id number] DOB: 12-Dec-1940 Today's Date: 03/18/2024   History of Present Illness   83 year old male presenting emergency department for fever and weakness, has some vomiting, typically ambulatory with assistance with walker, but was unable to ambulate secondary to his weakness.  He has dementia and his baseline per EMS reports.     Clinical Impressions Pt presents from ALF and reports that he was Ind with ADLs, however suspect he was at least at Sup with set up and cuing level at baseline with selfcare as he requires cues verbal and visual cues to initiate tasks. Of note, daughter reports that pt has urinary incontinence and often wears adult briefs. Pt reports he has RW but uncertain if he actaully uses it, Noted that daughter reports that he normally uses a RW. Pt currently requires min A with LB ADLs, CGA with grooming/hygiene standing at sink with flexed posture during ADL activity  and while walking, pt able to correct to upright postural stance with verbal cues. Initially using RW min A/CGA +2, then 2 person HHA. suspect pt furniture/wall surfs at baseline although noted that he uses a RW. Multimodal cues throughout for initiation of tasks. O2 SATs 97-99%. OT will follow acutely to maximize level of function and safety   If plan is discharge home, recommend the following:   A little help with bathing/dressing/bathroom;A little help with walking and/or transfers;Supervision due to cognitive status;Assist for transportation;Help with stairs or ramp for entrance;Direct supervision/assist for medications management     Functional Status Assessment   Patient has had a recent decline in their functional status and demonstrates the ability to make significant improvements in function in a reasonable and predictable amount of time.     Equipment Recommendations   None recommended by OT     Recommendations for  Other Services         Precautions/Restrictions   Precautions Precautions: Fall Restrictions Weight Bearing Restrictions Per Provider Order: No     Mobility Bed Mobility Overal bed mobility: Needs Assistance Bed Mobility: Supine to Sit, Sit to Supine     Supine to sit: Supervision Sit to supine: Supervision   General bed mobility comments: no physical assist    Transfers Overall transfer level: Needs assistance Equipment used: Rolling walker (2 wheels), 2 person hand held assist Transfers: Sit to/from Stand, Bed to chair/wheelchair/BSC Sit to Stand: Min assist, Contact guard assist, +2 physical assistance           General transfer comment: pt with flexed posture during ADL activity standing at sink and wih walking, pt able to correct to upright postural stance with verbal cues. Initially using RW, then 2 person HHA. suspect pt furniture/wall surfs at baseline although noted that he uses a RW      Balance Overall balance assessment: Needs assistance Sitting-balance support: No upper extremity supported, Feet supported Sitting balance-Leahy Scale: Good     Standing balance support: Single extremity supported, Bilateral upper extremity supported, During functional activity Standing balance-Leahy Scale: Poor                             ADL either performed or assessed with clinical judgement   ADL Overall ADL's : Needs assistance/impaired Eating/Feeding: Independent   Grooming: Wash/dry hands;Wash/dry face;Oral care;Contact guard assist;Standing   Upper Body Bathing: Set up;Supervision/ safety;Sitting   Lower Body Bathing: Minimal assistance;Cueing for safety   Upper Body Dressing : Set  up;Supervision/safety;Sitting   Lower Body Dressing: Minimal assistance;Cueing for safety   Toilet Transfer: Minimal assistance;Contact guard assist;Ambulation;Cueing for safety   Toileting- Clothing Manipulation and Hygiene: Minimal assistance;Contact guard  assist;+2 for physical assistance;Sit to/from stand;Cueing for safety         General ADL Comments: multimodal cues throughout for initiation of tasks     Vision Baseline Vision/History: 1 Wears glasses Ability to See in Adequate Light: 0 Adequate Patient Visual Report: No change from baseline       Perception         Praxis         Pertinent Vitals/Pain Pain Assessment Pain Assessment: No/denies pain     Extremity/Trunk Assessment Upper Extremity Assessment Upper Extremity Assessment: Overall WFL for tasks assessed   Lower Extremity Assessment Lower Extremity Assessment: Defer to PT evaluation       Communication Communication Communication: No apparent difficulties   Cognition Arousal: Alert Behavior During Therapy: WFL for tasks assessed/performed Cognition: History of cognitive impairments             OT - Cognition Comments: pt with hx of vascular dementia, required multimodal cues throughout for initiation, safety and postural positioning during standig tasks and functional mobility                 Following commands: Impaired Following commands impaired: Follows multi-step commands with increased time     Cueing  General Comments   Cueing Techniques: Verbal cues;Tactile cues;Visual cues      Exercises     Shoulder Instructions      Home Living Family/patient expects to be discharged to:: Assisted living                             Home Equipment: Rolling Walker (2 wheels)          Prior Functioning/Environment               Mobility Comments: Pt reports he has RW but uncertain if he actaully uses it, Noted that daughter reports that he normally uses a RW ADLs Comments: Pt reports that he was Ind with ADLs, however suspect he was at least at Sup with set up level with selfcare as he requires cues verbal and visual cues to initiate tasks. Of note, daughter reports that pt has urinary incontinence and often wears  adult briefs    OT Problem List: Decreased strength;Impaired balance (sitting and/or standing);Decreased cognition;Decreased safety awareness;Decreased activity tolerance;Decreased knowledge of use of DME or AE   OT Treatment/Interventions: Self-care/ADL training;Patient/family education;Therapeutic exercise;Balance training;Therapeutic activities;DME and/or AE instruction      OT Goals(Current goals can be found in the care plan section)   Acute Rehab OT Goals Patient Stated Goal: none stated OT Goal Formulation: With patient Time For Goal Achievement: 04/01/24 Potential to Achieve Goals: Good ADL Goals Pt Will Perform Grooming: with supervision;with set-up;standing Pt Will Perform Lower Body Bathing: with contact guard assist;with supervision;sitting/lateral leans;sit to/from stand Pt Will Perform Lower Body Dressing: with contact guard assist;with supervision;sitting/lateral leans;sit to/from stand Pt Will Transfer to Toilet: with contact guard assist;with supervision;ambulating Pt Will Perform Toileting - Clothing Manipulation and hygiene: with contact guard assist;with supervision;sitting/lateral leans;sit to/from stand   OT Frequency:  Min 2X/week    Co-evaluation              AM-PAC OT 6 Clicks Daily Activity     Outcome Measure Help from another person eating meals?: None  Help from another person taking care of personal grooming?: A Little Help from another person toileting, which includes using toliet, bedpan, or urinal?: A Little Help from another person bathing (including washing, rinsing, drying)?: A Little Help from another person to put on and taking off regular upper body clothing?: A Little Help from another person to put on and taking off regular lower body clothing?: A Little 6 Click Score: 19   End of Session Equipment Utilized During Treatment: Gait belt;Rolling walker (2 wheels) Nurse Communication: Mobility status  Activity Tolerance: Patient  tolerated treatment well Patient left: in bed;with call bell/phone within reach;with bed alarm set;with nursing/sitter in room  OT Visit Diagnosis: Unsteadiness on feet (R26.81);Other abnormalities of gait and mobility (R26.89);Other symptoms and signs involving cognitive function;Muscle weakness (generalized) (M62.81)                Time: 8862-8799 OT Time Calculation (min): 23 min Charges:  OT General Charges $OT Visit: 1 Visit OT Evaluation $OT Eval Low Complexity: 1 Low    Jacques Karna Loose 03/18/2024, 1:13 PM

## 2024-03-18 NOTE — Discharge Summary (Signed)
 Physician Discharge Summary  Mark Soto FMW:[id number] DOB: October 11, 1940 DOA: 03/17/2024  PCP: Pia Kerney SQUIBB, MD  Admit date: 03/17/2024 Discharge date: 03/18/2024  Admitted From: Home Disposition: Home  Recommendations for Outpatient Follow-up:  Follow up with PCP in 1-2 weeks Report back to the hospital if worsening symptoms  Home Health: None Equipment/Devices: None  Discharge Condition: Stable CODE STATUS: Full Diet recommendation: Low-salt low-fat diet  Brief/Interim Summary: Mark Soto is a 83 y.o. male with medical history significant for vascular dementia, HTN, HLD, T2DM, BPH, seizure and CVA who presented from [facility] assisted living for evaluation of generalized weakness.   Patient admitted as above with worsening fatigue weakness found to be COVID-positive despite multiple negative tests earlier this month.  At this time patient mains without hypoxia fatigue weakness now ambulating independently and tolerating p.o. quite well.  Denies nausea vomiting diarrhea.  Otherwise stable and agreeable for discharge home.  Discharge Diagnoses:  Principal Problem:   COVID-19 virus infection Active Problems:   Generalized weakness   AKI (acute kidney injury) (HCC)   Dehydration   Severe hypertension    Discharge Instructions  Discharge Instructions     Call MD for:  difficulty breathing, headache or visual disturbances   Complete by: As directed    Call MD for:  extreme fatigue   Complete by: As directed    Call MD for:  hives   Complete by: As directed    Call MD for:  persistant dizziness or light-headedness   Complete by: As directed    Call MD for:  persistant nausea and vomiting   Complete by: As directed    Call MD for:  severe uncontrolled pain   Complete by: As directed    Call MD for:  temperature >100.4   Complete by: As directed    Diet - low sodium heart healthy   Complete by: As directed    Discharge instructions   Complete by: As  directed    Monitor your symptoms Seek prompt medical attention if your illness is worsening (e.g., difficulty breathing). Before going to your medical appointment, call the healthcare provider and tell them that you have, or are being evaluated for, COVID-19 infection. Ask your healthcare provider to call the local or state health department.  Wear a facemask You should wear a facemask that covers your nose and mouth when you are in the same room with other people and when you visit a healthcare provider. People who live with or visit you should also wear a facemask while they are in the same room with you. Separate yourself from other people in your home As much as possible, you should stay in a different room from other people in your home. Also, you should use a separate bathroom, if available. Avoid sharing household items. You should not share dishes, drinking glasses, cups, eating utensils, towels, bedding, or other items with other people in your home. After using these items, you should wash them thoroughly with soap and water. Cover your coughs and sneezes. Cover your mouth and nose with a tissue when you cough or sneeze, or you can cough or sneeze into your sleeve.  Wash your hands often and thoroughly with soap and water for at least 20 seconds. You can use an alcohol-based hand sanitizer if soap and water are not available and if your hands are not visibly dirty.   Increase activity slowly   Complete by: As directed       Allergies as of  03/18/2024   No Known Allergies      Medication List     TAKE these medications    Accu-Chek Softclix Lancet Dev Kit 1 Device by Does not apply route daily.   amLODipine  5 MG tablet Commonly known as: NORVASC  Take 5 mg by mouth daily.   aspirin  EC 81 MG tablet Take 81 mg by mouth daily. Swallow whole.   atorvastatin  10 MG tablet Commonly known as: LIPITOR  Take 10 mg by mouth at bedtime.   blood glucose meter kit and supplies Kit 1  each by Does not apply route daily. Dispense based on patient and insurance preference. Use up to four times daily as directed.   donepezil  10 MG tablet Commonly known as: ARICEPT  Take 10 mg by mouth at bedtime.   guaiFENesin  100 MG/5ML liquid Commonly known as: ROBITUSSIN Take 10 mLs by mouth every 4 (four) hours as needed for cough.   hydrALAZINE  25 MG tablet Commonly known as: APRESOLINE  Take 25 mg by mouth daily.   levETIRAcetam  500 MG tablet Commonly known as: KEPPRA  Take 500 mg by mouth daily.   lisinopril 10 MG tablet Commonly known as: ZESTRIL Take 10 mg by mouth daily.   metFORMIN  500 MG tablet Commonly known as: GLUCOPHAGE  Take 500 mg by mouth 2 (two) times daily with a meal.   metoprolol  tartrate 25 MG tablet Commonly known as: LOPRESSOR  Take 25 mg by mouth 2 (two) times daily.   sertraline  100 MG tablet Commonly known as: ZOLOFT  Take 150 mg by mouth daily.        No Known Allergies  Consultations: None   Procedures/Studies: CT Head Wo Contrast Result Date: 03/17/2024 CLINICAL DATA:  Neuro deficit, acute, stroke suspected EXAM: CT HEAD WITHOUT CONTRAST TECHNIQUE: Contiguous axial images were obtained from the base of the skull through the vertex without intravenous contrast. RADIATION DOSE REDUCTION: This exam was performed according to the departmental dose-optimization program which includes automated exposure control, adjustment of the mA and/or kV according to patient size and/or use of iterative reconstruction technique. COMPARISON:  CT head 08/13/2020 FINDINGS: Brain: Cerebral ventricle sizes are concordant with the degree of cerebral volume loss. Patchy and confluent areas of decreased attenuation are noted throughout the deep and periventricular white matter of the cerebral hemispheres bilaterally, compatible with chronic microvascular ischemic disease. No evidence of large-territorial acute infarction. No parenchymal hemorrhage. No mass lesion. No  extra-axial collection. No mass effect or midline shift. No hydrocephalus. Basilar cisterns are patent. Vascular: No hyperdense vessel. Skull: No acute fracture or focal lesion. Sinuses/Orbits: Paranasal sinuses and mastoid air cells are clear. The orbits are unremarkable. Other: None. IMPRESSION: Cerebral ventricle sizes are concordant with the degree of cerebral volume loss. Electronically Signed   By: Morgane  Naveau M.D.   On: 03/17/2024 17:49   DG Chest Port 1 View Result Date: 03/17/2024 CLINICAL DATA:  Questionable sepsis - evaluate for abnormality. Weakness, vomiting EXAM: PORTABLE CHEST 1 VIEW COMPARISON:  08/13/2020 FINDINGS: Heart and mediastinal contours are within normal limits. No focal opacities or effusions. No acute bony abnormality. IMPRESSION: No active disease. Electronically Signed   By: Franky Crease M.D.   On: 03/17/2024 16:33     Subjective: No acute issues or events overnight   Discharge Exam: Vitals:   03/18/24 0825 03/18/24 1017  BP: 134/61 139/69  Pulse: (!) 58 (!) 57  Resp: 20   Temp: 97.7 F (36.5 C)   SpO2: 97%    Vitals:   03/18/24 0000 03/18/24 0323 03/18/24  0825 03/18/24 1017  BP:  (!) 138/51 134/61 139/69  Pulse:  (!) 58 (!) 58 (!) 57  Resp:  18 20   Temp:  98.8 F (37.1 C) 97.7 F (36.5 C)   TempSrc:   Oral   SpO2:  97% 97%   Height: 6' (1.829 m)       General: Pt is alert, awake, not in acute distress Cardiovascular: RRR, S1/S2 +, no rubs, no gallops Respiratory: CTA bilaterally, no wheezing, no rhonchi Abdominal: Soft, NT, ND, bowel sounds + Extremities: no edema, no cyanosis   The results of significant diagnostics from this hospitalization (including imaging, microbiology, ancillary and laboratory) are listed below for reference.     Microbiology: Recent Results (from the past 240 hours)  Resp panel by RT-PCR (RSV, Flu A&B, Covid) Anterior Nasal Swab     Status: Abnormal   Collection Time: 03/17/24  4:20 PM   Specimen: Anterior  Nasal Swab  Result Value Ref Range Status   SARS Coronavirus 2 by RT PCR POSITIVE (A) NEGATIVE Final    Comment: (NOTE) SARS-CoV-2 target nucleic acids are DETECTED.  The SARS-CoV-2 RNA is generally detectable in upper respiratory specimens during the acute phase of infection. Positive results are indicative of the presence of the identified virus, but do not rule out bacterial infection or co-infection with other pathogens not detected by the test. Clinical correlation with patient history and other diagnostic information is necessary to determine patient infection status. The expected result is Negative.  Fact Sheet for Patients: BloggerCourse.com  Fact Sheet for Healthcare Providers: SeriousBroker.it  This test is not yet approved or cleared by the [place]  FDA and  has been authorized for detection and/or diagnosis of SARS-CoV-2 by FDA under an Emergency Use Authorization (EUA).  This EUA will remain in effect (meaning this test can be used) for the duration of  the COVID-19 declaration under Section 564(b)(1) of the A ct, 21 U.S.C. section 360bbb-3(b)(1), unless the authorization is terminated or revoked sooner.     Influenza A by PCR NEGATIVE NEGATIVE Final   Influenza B by PCR NEGATIVE NEGATIVE Final    Comment: (NOTE) The Xpert Xpress SARS-CoV-2/FLU/RSV plus assay is intended as an aid in the diagnosis of influenza from Nasopharyngeal swab specimens and should not be used as a sole basis for treatment. Nasal washings and aspirates are unacceptable for Xpert Xpress SARS-CoV-2/FLU/RSV testing.  Fact Sheet for Patients: BloggerCourse.com  Fact Sheet for Healthcare Providers: SeriousBroker.it  This test is not yet approved or cleared by the [place]  FDA and has been authorized for detection and/or diagnosis of SARS-CoV-2 by FDA under an Emergency Use  Authorization (EUA). This EUA will remain in effect (meaning this test can be used) for the duration of the COVID-19 declaration under Section 564(b)(1) of the Act, 21 U.S.C. section 360bbb-3(b)(1), unless the authorization is terminated or revoked.     Resp Syncytial Virus by PCR NEGATIVE NEGATIVE Final    Comment: (NOTE) Fact Sheet for Patients: BloggerCourse.com  Fact Sheet for Healthcare Providers: SeriousBroker.it  This test is not yet approved or cleared by the [place]  FDA and has been authorized for detection and/or diagnosis of SARS-CoV-2 by FDA under an Emergency Use Authorization (EUA). This EUA will remain in effect (meaning this test can be used) for the duration of the COVID-19 declaration under Section 564(b)(1) of the Act, 21 U.S.C. section 360bbb-3(b)(1), unless the authorization is terminated or revoked.  Performed at [facility], [address]  [place], [place] [address]   Blood Culture (routine x 2)     Status: None (Preliminary result)   Collection Time: 03/17/24  4:23 PM   Specimen: BLOOD RIGHT WRIST  Result Value Ref Range Status   Specimen Description   Final    BLOOD RIGHT WRIST Performed at [facility], [address] [place], [place] [address]    Special Requests   Final    BOTTLES DRAWN AEROBIC AND ANAEROBIC Blood Culture results may not be optimal due to an inadequate volume of blood received in culture bottles Performed at [facility], [address] [place], [place] [address]    Culture   Final    NO GROWTH < 24 HOURS Performed at [facility], [address] [place], [place] [address]    Report Status PENDING  Incomplete  Blood Culture (routine x 2)     Status: None (Preliminary result)   Collection Time: 03/17/24  5:04 PM   Specimen: BLOOD LEFT HAND  Result Value Ref Range Status   Specimen Description   Final    BLOOD LEFT  HAND Performed at [facility], [address] [place], [place] [address]    Special Requests   Final    BOTTLES DRAWN AEROBIC AND ANAEROBIC Blood Culture results may not be optimal due to an inadequate volume of blood received in culture bottles Performed at [facility], [address], [place], [place] [address]    Culture   Final    NO GROWTH < 24 HOURS Performed at [facility], [address], [place], [place] [address]    Report Status PENDING  Incomplete     Labs: BNP (last 3 results) No results for input(s): BNP in the last 8760 hours. Basic Metabolic Panel: Recent Labs  Lab 03/17/24 1646 03/18/24 0436  NA 139 139  K 4.2 3.7  CL 103 104  CO2 26 24  GLUCOSE 114* 116*  BUN 16 13  CREATININE 1.29* 0.99  CALCIUM  9.2 8.5*   Liver Function Tests: Recent Labs  Lab 03/17/24 1646  AST 16  ALT 14  ALKPHOS 73  BILITOT 0.7  PROT 7.8  ALBUMIN 3.8   Recent Labs  Lab 03/17/24 1646  LIPASE 30   No results for input(s): AMMONIA in the last 168 hours. CBC: Recent Labs  Lab 03/17/24 1704 03/18/24 0436  WBC 7.9 7.0  NEUTROABS 6.2  --   HGB 12.0* 10.7*  HCT 39.3 35.8*  MCV 78.0* 77.0*  PLT 131* 116*   CBG: Recent Labs  Lab 03/17/24 2358 03/18/24 0821 03/18/24 1206  GLUCAP 109* 109* 98   Urinalysis    Component Value Date/Time   COLORURINE YELLOW 08/13/2020 1055   APPEARANCEUR CLEAR 08/13/2020 1055   LABSPEC 1.005 08/13/2020 1055   PHURINE 5.0 08/13/2020 1055   GLUCOSEU NEGATIVE 08/13/2020 1055   HGBUR LARGE (A) 08/13/2020 1055   BILIRUBINUR NEGATIVE 08/13/2020 1055   BILIRUBINUR neg 01/29/2013 1821   KETONESUR NEGATIVE 08/13/2020 1055   PROTEINUR NEGATIVE 08/13/2020 1055   UROBILINOGEN 1.0 06/13/2014 1802   NITRITE NEGATIVE 08/13/2020 1055   LEUKOCYTESUR NEGATIVE 08/13/2020 1055   Sepsis Labs Recent Labs  Lab 03/17/24 1704 03/18/24 0436  WBC 7.9 7.0   Microbiology Recent Results (from the past 240  hours)  Resp panel by RT-PCR (RSV, Flu A&B, Covid) Anterior Nasal Swab     Status: Abnormal   Collection Time: 03/17/24  4:20 PM  Specimen: Anterior Nasal Swab  Result Value Ref Range Status   SARS Coronavirus 2 by RT PCR POSITIVE (A) NEGATIVE Final    Comment: (NOTE) SARS-CoV-2 target nucleic acids are DETECTED.  The SARS-CoV-2 RNA is generally detectable in upper respiratory specimens during the acute phase of infection. Positive results are indicative of the presence of the identified virus, but do not rule out bacterial infection or co-infection with other pathogens not detected by the test. Clinical correlation with patient history and other diagnostic information is necessary to determine patient infection status. The expected result is Negative.  Fact Sheet for Patients: BloggerCourse.com  Fact Sheet for Healthcare Providers: SeriousBroker.it  This test is not yet approved or cleared by the [place]  FDA and  has been authorized for detection and/or diagnosis of SARS-CoV-2 by FDA under an Emergency Use Authorization (EUA).  This EUA will remain in effect (meaning this test can be used) for the duration of  the COVID-19 declaration under Section 564(b)(1) of the A ct, 21 U.S.C. section 360bbb-3(b)(1), unless the authorization is terminated or revoked sooner.     Influenza A by PCR NEGATIVE NEGATIVE Final   Influenza B by PCR NEGATIVE NEGATIVE Final    Comment: (NOTE) The Xpert Xpress SARS-CoV-2/FLU/RSV plus assay is intended as an aid in the diagnosis of influenza from Nasopharyngeal swab specimens and should not be used as a sole basis for treatment. Nasal washings and aspirates are unacceptable for Xpert Xpress SARS-CoV-2/FLU/RSV testing.  Fact Sheet for Patients: BloggerCourse.com  Fact Sheet for Healthcare Providers: SeriousBroker.it  This test is not yet  approved or cleared by the [place]  FDA and has been authorized for detection and/or diagnosis of SARS-CoV-2 by FDA under an Emergency Use Authorization (EUA). This EUA will remain in effect (meaning this test can be used) for the duration of the COVID-19 declaration under Section 564(b)(1) of the Act, 21 U.S.C. section 360bbb-3(b)(1), unless the authorization is terminated or revoked.     Resp Syncytial Virus by PCR NEGATIVE NEGATIVE Final    Comment: (NOTE) Fact Sheet for Patients: BloggerCourse.com  Fact Sheet for Healthcare Providers: SeriousBroker.it  This test is not yet approved or cleared by the [place]  FDA and has been authorized for detection and/or diagnosis of SARS-CoV-2 by FDA under an Emergency Use Authorization (EUA). This EUA will remain in effect (meaning this test can be used) for the duration of the COVID-19 declaration under Section 564(b)(1) of the Act, 21 U.S.C. section 360bbb-3(b)(1), unless the authorization is terminated or revoked.  Performed at [facility], [address] [place], [place] [address]   Blood Culture (routine x 2)     Status: None (Preliminary result)   Collection Time: 03/17/24  4:23 PM   Specimen: BLOOD RIGHT WRIST  Result Value Ref Range Status   Specimen Description   Final    BLOOD RIGHT WRIST Performed at [facility], [address] [place], [place] [address]    Special Requests   Final    BOTTLES DRAWN AEROBIC AND ANAEROBIC Blood Culture results may not be optimal due to an inadequate volume of blood received in culture bottles Performed at [facility], [address], [place], [place] [address]    Culture   Final    NO GROWTH < 24 HOURS Performed at [facility], [address] [place], [place] [address]    Report Status PENDING  Incomplete  Blood Culture (routine x 2)     Status:  None (Preliminary result)    Collection Time: 03/17/24  5:04 PM   Specimen: BLOOD LEFT HAND  Result Value Ref Range Status   Specimen Description   Final    BLOOD LEFT HAND Performed at [facility], [address] [place], [place] [address]    Special Requests   Final    BOTTLES DRAWN AEROBIC AND ANAEROBIC Blood Culture results may not be optimal due to an inadequate volume of blood received in culture bottles Performed at [facility], [address], [place], [place] [address]    Culture   Final    NO GROWTH < 24 HOURS Performed at [facility], [address], [place], [place] [address]    Report Status PENDING  Incomplete     Time coordinating discharge: Over 30 minutes  SIGNED:   Elsie JAYSON [place], DO [facility] 03/18/2024, 12:43 PM Pager   If 7PM-7AM, please contact night-coverage [email or web address]

## 2024-03-18 NOTE — Evaluation (Signed)
 Physical Therapy Evaluation Patient Details Name: Mark Soto MRN: [id number] DOB: January 19, 1941 Today's Date: 03/18/2024  History of Present Illness  83 year old male presenting emergency department for fever and weakness, has some vomiting, typically ambulatory with assistance with walker, but was unable to ambulate secondary to his weakness.  He has dementia and his baseline per EMS reports. tested positive for covid  Clinical Impression  Pt admitted with above diagnosis.  Pt reports he has RW but uncertain if he actually uses it, Noted that chart states per family pt normally uses a RW. Pt had much difficulty maneuvering and managing RW during PT session. pt able to correct to upright postural stance with verbal cues during gait. Initially using RW min A/CGA +2, then progressed to 2 person HHA--min to CGA. suspect pt furniture walks/surfs at baseline. Multimodal cues throughout for initiation of tasks and completion of tasks. SpO2=97-99% on RA.  May benefit from HHPT at d/c    Pt currently with functional limitations due to the deficits listed below (see PT Problem List). Pt will benefit from acute skilled PT to increase their independence and safety with mobility to allow discharge.           If plan is discharge home, recommend the following: A little help with walking and/or transfers;A little help with bathing/dressing/bathroom;Direct supervision/assist for medications management;Assist for transportation;Help with stairs or ramp for entrance;Assistance with cooking/housework;Supervision due to cognitive status   Can travel by private vehicle        Equipment Recommendations None recommended by PT  Recommendations for Other Services       Functional Status Assessment Patient has had a recent decline in their functional status and demonstrates the ability to make significant improvements in function in a reasonable and predictable amount of time.     Precautions / Restrictions  Precautions Precautions: Fall Restrictions Weight Bearing Restrictions Per Provider Order: No      Mobility  Bed Mobility Overal bed mobility: Needs Assistance Bed Mobility: Supine to Sit, Sit to Supine     Supine to sit: Supervision Sit to supine: Supervision   General bed mobility comments: no physical assist    Transfers   Equipment used: Rolling walker (2 wheels), 2 person hand held assist Transfers: Sit to/from Stand Sit to Stand: Min assist, Contact guard assist, +2 physical assistance           General transfer comment: pt with flexed posture during ADL activity standing at sink and wih walking, pt able to correct to upright postural stance with verbal cues. Initially using RW, then 2 person HHA. suspect pt furniture/wall surfs at baseline although noted that he uses a RW    Ambulation/Gait Ambulation/Gait assistance: Min assist, Contact guard assist Gait Distance (Feet): 50 Feet Assistive device: None, 2 person hand held assist, 1 person hand held assist Gait Pattern/deviations: Step-to pattern, Decreased step length - right, Decreased step length - left, Drifts right/left, Trunk flexed Gait velocity: decr     General Gait Details: multi-modal cues for posture, step length. pt requiring incr assist and cues with RW, chart states he uses RW, suspect pt may furniture surf at baseline; fatigues easily, SpO2=97-99% on RA  Stairs            Wheelchair Mobility     Tilt Bed    Modified Rankin (Stroke Patients Only)       Balance   Sitting-balance support: No upper extremity supported, Feet supported Sitting balance-Leahy Scale: Good     Standing balance  support: Single extremity supported, Bilateral upper extremity supported, During functional activity Standing balance-Leahy Scale: Poor Standing balance comment: reliant on at least unilateral UE support                             Pertinent Vitals/Pain Pain Assessment Pain  Assessment: No/denies pain    Home Living Family/patient expects to be discharged to:: Assisted living                 Home Equipment: Rolling Walker (2 wheels)      Prior Function Prior Level of Function : Patient poor historian/Family not available             Mobility Comments: Pt reports he has RW but uncertain if he actually uses it ADLs Comments: Pt reports that he was Ind with ADLs, however suspect he was at least at Sup with set up level with selfcare as he requires cues verbal and visual cues to initiate tasks. Of note, daughter reports that pt has urinary incontinence and often wears adult briefs     Extremity/Trunk Assessment   Upper Extremity Assessment Upper Extremity Assessment: Defer to OT evaluation;Overall [facility] for tasks assessed    Lower Extremity Assessment Lower Extremity Assessment: Overall WFL for tasks assessed    Cervical / Trunk Assessment Cervical / Trunk Assessment: Kyphotic  Communication   Communication Communication: No apparent difficulties    Cognition Arousal: Alert Behavior During Therapy: WFL for tasks assessed/performed   PT - Cognitive impairments: Initiation, Sequencing, Memory, Safety/Judgement, Orientation   Orientation impairments: Place, Time, Situation                   PT - Cognition Comments: oriented to self. pleasant, cooperative, follows commands consistently. occasional word finding difficulty. Following commands: Impaired Following commands impaired: Follows multi-step commands with increased time, Follows one step commands with increased time     Cueing Cueing Techniques: Verbal cues, Tactile cues, Visual cues     General Comments      Exercises     Assessment/Plan    PT Assessment Patient needs continued PT services  PT Problem List Decreased activity tolerance;Decreased balance;Decreased knowledge of use of DME;Decreased cognition;Decreased mobility       PT Treatment Interventions DME  instruction;Therapeutic exercise;Gait training;Functional mobility training;Therapeutic activities;Patient/family education;Balance training    PT Goals (Current goals can be found in the Care Plan section)  Acute Rehab PT Goals PT Goal Formulation: With patient Time For Goal Achievement: 04/01/24 Potential to Achieve Goals: Good    Frequency Min 2X/week     Co-evaluation               AM-PAC PT 6 Clicks Mobility  Outcome Measure Help needed turning from your back to your side while in a flat bed without using bedrails?: A Little Help needed moving from lying on your back to sitting on the side of a flat bed without using bedrails?: A Little Help needed moving to and from a bed to a chair (including a wheelchair)?: A Little Help needed standing up from a chair using your arms (e.g., wheelchair or bedside chair)?: A Little Help needed to walk in hospital room?: A Little Help needed climbing 3-5 steps with a railing? : A Lot 6 Click Score: 17    End of Session Equipment Utilized During Treatment: Gait belt Activity Tolerance: Patient tolerated treatment well;Patient limited by fatigue Patient left: in bed;with call bell/phone within reach;with nursing/sitter  in room (no recliner in room-returned pt to bed) Nurse Communication: Mobility status PT Visit Diagnosis: Other abnormalities of gait and mobility (R26.89)    Time: 8862-8799 PT Time Calculation (min) (ACUTE ONLY): 23 min   Charges:   PT Evaluation $PT Eval Low Complexity: 1 Low   PT General Charges $$ ACUTE PT VISIT: 1 Visit         Zamyiah Tino, PT  Acute Rehab Dept [facility]) [phone]  03/18/2024   [facility] 03/18/2024, 1:41 PM

## 2024-03-18 NOTE — Progress Notes (Signed)
 Instructed ordered Flutter device. PT has a difficult time following instruction but does attempt. BBS clear- diminished, CXR 03/17/24 nothing acute, PT has dry cough, no respiratory distress noted at this time, Sp02 on RA 98%, HR 58, RR 17.

## 2024-03-18 NOTE — NC FL2 (Signed)
 Mark Soto  MEDICAID FL2 LEVEL OF CARE FORM     IDENTIFICATION  Patient Name: Mark Soto Birthdate: [birth year] Sex: male Admission Date (Current Location): 03/17/2024  [facility] and [place] Number:  Producer, television/film/video and Address:  [facility],  [address], [place] [address]      Provider Number: [id number]  Attending Physician Name and Address:  Lue Elsie BROCKS, MD  Relative Name and Phone Number:  Mark Soto, [name] (Daughter)  [phone]    Current Level of Care: Hospital Recommended Level of Care: Assisted Living Facility Prior Approval Number:    Date Approved/Denied:   PASRR Number:    Discharge Plan: Other (Comment) ([facility])    Current Diagnoses: Patient Active Problem List   Diagnosis Date Noted   COVID-19 virus infection 03/17/2024   Generalized weakness 03/17/2024   AKI (acute kidney injury) (HCC) 03/17/2024   Dehydration 03/17/2024   Severe hypertension 03/17/2024   Orthostatic hypotension 12/13/2020   Rhabdomyolysis 08/14/2020   PNA (pneumonia) 08/13/2020   Fall 08/13/2020   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   CAP (community acquired pneumonia) 07/11/2015   History of CVA (cerebrovascular accident) 09/11/2014   Obesity (BMI 30.0-34.9) 09/11/2014   Syphilis, latent 09/04/2014   Vascular dementia (HCC) 09/04/2014   Essential hypertension, benign 06/25/2014   BPH (benign prostatic hyperplasia) 06/25/2014   HLD (hyperlipidemia)    PFO (patent foramen ovale)    Seizure (HCC) 06/13/2014   History of colonic polyps 04/14/2013    Orientation RESPIRATION BLADDER Height & Weight     Self, Time, Place  Normal Incontinent Weight:   Height:  6' (182.9 cm)  BEHAVIORAL SYMPTOMS/MOOD NEUROLOGICAL BOWEL NUTRITION STATUS      Continent Diet (Heart Healthy)  AMBULATORY STATUS COMMUNICATION OF NEEDS Skin   Independent (Modified independent, uses RW) Verbally Normal                        Personal Care Assistance Level of Assistance  Bathing, Feeding, Dressing Bathing Assistance: Limited assistance Feeding assistance: Independent Dressing Assistance: Independent     Functional Limitations Info  Sight, Hearing, Speech Sight Info: Adequate Hearing Info: Adequate Speech Info: Adequate    SPECIAL CARE FACTORS FREQUENCY                       Contractures Contractures Info: Not present    Additional Factors Info  Code Status, Allergies Code Status Info: FULL Allergies Info: NKA           Current Medications (03/18/2024):  This is the current hospital active medication list Current Facility-Administered Medications  Medication Dose Route Frequency Provider Last Rate Last Admin   0.9 %  sodium chloride  infusion   Intravenous Continuous Amponsah, Prosper M, MD 125 mL/hr at 03/17/24 2221 New Bag at 03/17/24 2221   acetaminophen  (TYLENOL ) tablet 650 mg  650 mg Oral Q6H PRN Amponsah, Prosper M, MD       Or   acetaminophen  (TYLENOL ) suppository 650 mg  650 mg Rectal Q6H PRN Lou Claretta HERO, MD       amLODipine  (NORVASC ) tablet 5 mg  5 mg Oral Daily Amponsah, Prosper M, MD   5 mg at 03/18/24 1018   aspirin  EC tablet 81 mg  81 mg Oral Daily Amponsah, Prosper M, MD   81 mg at 03/18/24 1018   atorvastatin  (LIPITOR ) tablet 10 mg  10 mg Oral QHS Amponsah, Prosper M, MD  bisacodyl  (DULCOLAX) EC tablet 5 mg  5 mg Oral Daily PRN Amponsah, Prosper M, MD       dextromethorphan -guaiFENesin  (MUCINEX  DM) 30-600 MG per 12 hr tablet 1 tablet  1 tablet Oral BID Lou Claretta HERO, MD   1 tablet at 03/18/24 1018   donepezil  (ARICEPT ) tablet 10 mg  10 mg Oral QHS Amponsah, Prosper M, MD       enoxaparin  (LOVENOX ) injection 40 mg  40 mg Subcutaneous Q24H Amponsah, Prosper M, MD   40 mg at 03/17/24 2300   feeding supplement (ENSURE PLUS HIGH PROTEIN) liquid 237 mL  237 mL Oral TID BM Lou Claretta HERO, MD       guaiFENesin  (ROBITUSSIN) 100 MG/5ML liquid 5 mL  5 mL  Oral Q4H PRN Amponsah, Prosper M, MD   5 mL at 03/17/24 2229   hydrALAZINE  (APRESOLINE ) tablet 25 mg  25 mg Oral Daily Amponsah, Prosper M, MD   25 mg at 03/18/24 1018   insulin  aspart (novoLOG ) injection 0-15 Units  0-15 Units Subcutaneous TID WC Amponsah, Prosper M, MD       insulin  aspart (novoLOG ) injection 0-5 Units  0-5 Units Subcutaneous QHS Lou Claretta HERO, MD       metoprolol  tartrate (LOPRESSOR ) tablet 25 mg  25 mg Oral BID Amponsah, Prosper M, MD   25 mg at 03/18/24 1019   ondansetron  (ZOFRAN ) tablet 4 mg  4 mg Oral Q6H PRN Lou Claretta HERO, MD       Or   ondansetron  (ZOFRAN ) injection 4 mg  4 mg Intravenous Q6H PRN Amponsah, Prosper M, MD       senna-docusate (Senokot-S) tablet 1 tablet  1 tablet Oral QHS PRN Lou Claretta HERO, MD       sertraline  (ZOLOFT ) tablet 150 mg  150 mg Oral Daily Amponsah, Prosper M, MD   150 mg at 03/18/24 1019     Discharge Medications:   TAKE these medications     Accu-Chek Softclix Lancet Dev Kit 1 Device by Does not apply route daily.    amLODipine  5 MG tablet Commonly known as: NORVASC  Take 5 mg by mouth daily.    aspirin  EC 81 MG tablet Take 81 mg by mouth daily. Swallow whole.    atorvastatin  10 MG tablet Commonly known as: LIPITOR  Take 10 mg by mouth at bedtime.    blood glucose meter kit and supplies Kit 1 each by Does not apply route daily. Dispense based on patient and insurance preference. Use up to four times daily as directed.    donepezil  10 MG tablet Commonly known as: ARICEPT  Take 10 mg by mouth at bedtime.    guaiFENesin  100 MG/5ML liquid Commonly known as: ROBITUSSIN Take 10 mLs by mouth every 4 (four) hours as needed for cough.    hydrALAZINE  25 MG tablet Commonly known as: APRESOLINE  Take 25 mg by mouth daily.    levETIRAcetam  500 MG tablet Commonly known as: KEPPRA  Take 500 mg by mouth daily.    lisinopril 10 MG tablet Commonly known as: ZESTRIL Take 10 mg by mouth daily.    metFORMIN  500 MG  tablet Commonly known as: GLUCOPHAGE  Take 500 mg by mouth 2 (two) times daily with a meal.    metoprolol  tartrate 25 MG tablet Commonly known as: LOPRESSOR  Take 25 mg by mouth 2 (two) times daily.    sertraline  100 MG tablet Commonly known as: ZOLOFT  Take 150 mg by mouth daily.    Relevant Imaging Results:  Relevant Lab Results:  Additional Information SSN [id number]  Heather DELENA Saltness, LCSW

## 2024-03-18 NOTE — Care Management Obs Status (Signed)
 MEDICARE OBSERVATION STATUS NOTIFICATION   Patient Details  Name: Mark Soto MRN: [id number] Date of Birth: 07/11/1941   Medicare Observation Status Notification Given:   Yes    Heather DELENA Saltness, LCSW 03/18/2024, 4:17 PM

## 2024-03-20 LAB — HEMOGLOBIN A1C
Hgb A1c MFr Bld: 5.9 % — ABNORMAL HIGH (ref 4.8–5.6)
Mean Plasma Glucose: 123 mg/dL

## 2024-03-22 LAB — CULTURE, BLOOD (ROUTINE X 2)
Culture: NO GROWTH
Culture: NO GROWTH

## 2024-04-06 ENCOUNTER — Encounter (HOSPITAL_COMMUNITY): Payer: Self-pay | Admitting: Emergency Medicine

## 2024-04-06 ENCOUNTER — Emergency Department (HOSPITAL_COMMUNITY)
Admission: EM | Admit: 2024-04-06 | Discharge: 2024-04-06 | Disposition: A | Discharge status: Home / Self Care | Attending: Emergency Medicine | Admitting: Emergency Medicine

## 2024-04-06 ENCOUNTER — Other Ambulatory Visit: Payer: Self-pay

## 2024-04-06 DIAGNOSIS — R350 Frequency of micturition: Secondary | ICD-10-CM | POA: Insufficient documentation

## 2024-04-06 DIAGNOSIS — I10 Essential (primary) hypertension: Secondary | ICD-10-CM | POA: Insufficient documentation

## 2024-04-06 DIAGNOSIS — Z79899 Other long term (current) drug therapy: Secondary | ICD-10-CM | POA: Diagnosis not present

## 2024-04-06 DIAGNOSIS — E119 Type 2 diabetes mellitus without complications: Secondary | ICD-10-CM | POA: Insufficient documentation

## 2024-04-06 DIAGNOSIS — Z7982 Long term (current) use of aspirin: Secondary | ICD-10-CM | POA: Insufficient documentation

## 2024-04-06 DIAGNOSIS — R531 Weakness: Secondary | ICD-10-CM | POA: Insufficient documentation

## 2024-04-06 DIAGNOSIS — Z7984 Long term (current) use of oral hypoglycemic drugs: Secondary | ICD-10-CM | POA: Diagnosis not present

## 2024-04-06 LAB — COMPREHENSIVE METABOLIC PANEL WITH GFR
ALT: 10 U/L (ref 0–44)
AST: 14 U/L — ABNORMAL LOW (ref 15–41)
Albumin: 3.9 g/dL (ref 3.5–5.0)
Alkaline Phosphatase: 70 U/L (ref 38–126)
Anion gap: 12 (ref 5–15)
BUN: 17 mg/dL (ref 8–23)
CO2: 22 mmol/L (ref 22–32)
Calcium: 9.2 mg/dL (ref 8.9–10.3)
Chloride: 109 mmol/L (ref 98–111)
Creatinine, Ser: 1.14 mg/dL (ref 0.61–1.24)
GFR, Estimated: >60 mL/min (ref >60)
Glucose, Bld: 97 mg/dL (ref 70–99)
Potassium: 4.2 mmol/L (ref 3.5–5.1)
Sodium: 143 mmol/L (ref 135–145)
Total Bilirubin: 0.3 mg/dL (ref 0.0–1.2)
Total Protein: 6.8 g/dL (ref 6.5–8.1)

## 2024-04-06 LAB — CBC
HCT: 37.7 % — ABNORMAL LOW (ref 39.0–52.0)
Hemoglobin: 10.9 g/dL — ABNORMAL LOW (ref 13.0–17.0)
MCH: 22.9 pg — ABNORMAL LOW (ref 26.0–34.0)
MCHC: 28.9 g/dL — ABNORMAL LOW (ref 30.0–36.0)
MCV: 79.4 fL — ABNORMAL LOW (ref 80.0–100.0)
Platelets: 157 K/uL (ref 150–400)
RBC: 4.75 MIL/uL (ref 4.22–5.81)
RDW: 17.5 % — ABNORMAL HIGH (ref 11.5–15.5)
WBC: 7.4 K/uL (ref 4.0–10.5)
nRBC: 0 % (ref 0.0–0.2)

## 2024-04-06 LAB — URINALYSIS, ROUTINE W REFLEX MICROSCOPIC
Bilirubin Urine: NEGATIVE
Glucose, UA: NEGATIVE mg/dL
Hgb urine dipstick: NEGATIVE
Ketones, ur: NEGATIVE mg/dL
Leukocytes,Ua: NEGATIVE
Nitrite: NEGATIVE
Protein, ur: NEGATIVE mg/dL
Specific Gravity, Urine: 1.02 (ref 1.005–1.030)
pH: 5 (ref 5.0–8.0)

## 2024-04-06 NOTE — ED Provider Notes (Signed)
 [place] EMERGENCY DEPARTMENT AT [facility] Provider Note   CSN: [id number] Arrival date & time: 04/06/24  1151     Patient presents with: Weakness   Mark Soto is a 83 y.o. male.   Patient with history of vascular dementia, HTN, HLD, T2DM, BPH, seizure and CVA, recent admission 8/8-03/18/24 for COVID infection -- presents per EMS report with weakness and urinary frequency x 2 days.  Mark Soto is awake and alert, states that he feels well today, cannot tell me the reason for his ED visit.  When asked if he urinates a lot, he states that he does.  He states that he has been eating and drinking well.  Currently denies any complaints of pain including chest pain or complaints of trouble breathing or cough.  Per triage report, patient from [facility] with weakness and urinary frequency for 2 days.  No reported falls, shortness of breath or chest pain.       Prior to Admission medications   Medication Sig Start Date End Date Taking? Authorizing Provider  amLODipine  (NORVASC ) 5 MG tablet Take 5 mg by mouth daily.    [provider]  aspirin  EC 81 MG tablet Take 81 mg by mouth daily. Swallow whole.    [provider]  atorvastatin  (LIPITOR ) 10 MG tablet Take 10 mg by mouth at bedtime. 07/18/20   [provider]  blood glucose meter kit and supplies KIT 1 each by Does not apply route daily. Dispense based on patient and insurance preference. Use up to four times daily as directed. 08/12/22   Ngetich, Dinah C, NP  donepezil  (ARICEPT ) 10 MG tablet Take 10 mg by mouth at bedtime. 08/05/20   [provider]  guaiFENesin  (ROBITUSSIN) 100 MG/5ML liquid Take 10 mLs by mouth every 4 (four) hours as needed for cough. 07/18/20   [provider]  hydrALAZINE  (APRESOLINE ) 25 MG tablet Take 25 mg by mouth daily.    [provider]  Lancets Misc. (ACCU-CHEK SOFTCLIX LANCET DEV) KIT 1 Device by Does not apply route daily. 08/12/22    Ngetich, Dinah C, NP  levETIRAcetam  (KEPPRA ) 500 MG tablet Take 500 mg by mouth daily.    [provider]  lisinopril (ZESTRIL) 10 MG tablet Take 10 mg by mouth daily.    [provider]  metFORMIN  (GLUCOPHAGE ) 500 MG tablet Take 500 mg by mouth 2 (two) times daily with a meal.    [provider]  metoprolol  tartrate (LOPRESSOR ) 25 MG tablet Take 25 mg by mouth 2 (two) times daily.    [provider]  sertraline  (ZOLOFT ) 100 MG tablet Take 150 mg by mouth daily. 08/05/20   [provider]    Allergies: Patient has no known allergies.    Review of Systems  Updated Vital Signs BP (!) 105/58   Pulse 80   Temp 98 F (36.7 C)   Resp 16   SpO2 100%   Physical Exam Vitals and nursing note reviewed.  Constitutional:      General: He is not in acute distress.    Appearance: He is well-developed.  HENT:     Head: Normocephalic and atraumatic.     Right Ear: External ear normal.     Left Ear: External ear normal.     Nose: Nose normal.     Mouth/Throat:     Mouth: Mucous membranes are moist.  Eyes:     General:        Right eye: No  discharge.        Left eye: No discharge.     Conjunctiva/sclera: Conjunctivae normal.     Pupils: Pupils are equal, round, and reactive to light.     Comments: Arcus senilis bilaterally  Cardiovascular:     Rate and Rhythm: Normal rate and regular rhythm.     Heart sounds: Normal heart sounds.  Pulmonary:     Effort: Pulmonary effort is normal.     Breath sounds: Normal breath sounds.  Abdominal:     Palpations: Abdomen is soft.     Tenderness: There is no abdominal tenderness. There is no guarding or rebound.  Musculoskeletal:     Cervical back: Normal range of motion and neck supple.     Comments: Patient moves all extremities without difficulty when asked.  He lifts his legs up off of the bed without difficulty.  Skin:    General: Skin is warm and dry.  Neurological:     General: No focal deficit  present.     Mental Status: He is alert.     (all labs ordered are listed, but only abnormal results are displayed) Labs Reviewed  COMPREHENSIVE METABOLIC PANEL WITH GFR - Abnormal; Notable for the following components:      Result Value   AST 14 (*)    All other components within normal limits  CBC - Abnormal; Notable for the following components:   Hemoglobin 10.9 (*)    HCT 37.7 (*)    MCV 79.4 (*)    MCH 22.9 (*)    MCHC 28.9 (*)    RDW 17.5 (*)    All other components within normal limits  URINALYSIS, ROUTINE W REFLEX MICROSCOPIC - Abnormal; Notable for the following components:   APPearance HAZY (*)    All other components within normal limits    EKG: None  Radiology: No results found.   Procedures   Medications Ordered in the ED - No data to display  ED Course  Patient seen and examined. History obtained directly from patient, but chief complaint obtained from EMS report.  Reviewed recent notes for hospitalization overnight for COVID.  Labs/EKG: Ordered CBC, CMP, UA.  Imaging: None ordered  Medications/Fluids: None ordered  Most recent vital signs reviewed and are as follows: BP (!) 105/58   Pulse 80   Temp 98 F (36.7 C)   Resp 16   SpO2 100%   Initial impression: Well-appearing patient here with report of weakness and urinary frequency.  Patient looks well.  History of dementia, unable to contribute to current history other than stating that he feels well.  5:40 PM Reassessment performed. Patient appears stable.  He states that he continues to feel well.  He can continues to not have any complaints.  Labs personally reviewed and interpreted including: CBC with normal white blood cell count, mild anemia which is stable; CMP unremarkable; UA negative for infection.  Reviewed pertinent lab work and imaging with patient at bedside. Questions answered.   Most current vital signs reviewed and are as follows: BP (!) 148/72 (BP Location: Right Arm)    Pulse 69   Temp 97.6 F (36.4 C) (Oral)   Resp 18   SpO2 100%   Plan: Discharge to home.   Prescriptions written for: None  Other home care instructions discussed: Monitoring of symptoms  ED return instructions discussed: New or worsening symptoms  Follow-up instructions discussed: Patient encouraged to follow-up with their PCP as needed for any medical concerns.  Medical Decision Making Amount and/or Complexity of Data Reviewed Labs: ordered.   Well-appearing patient presenting today from his facility for reported weakness and urinary frequency.  No UTI on UA.  No uncontrolled hyperglycemia on labs.  Patient looks very well.  Plan for discharge home with continued outpatient follow-up.  The patient's vital signs, pertinent lab work and imaging were reviewed and interpreted as discussed in the ED course. Hospitalization was considered for further testing, treatments, or serial exams/observation. However as patient is well-appearing, has a stable exam, and reassuring studies today, I do not feel that they warrant admission at this time. This plan was discussed with the patient who verbalizes agreement and comfort with this plan and seems reliable and able to return to the Emergency Department with worsening or changing symptoms.       Final diagnoses:  Weakness  Urinary frequency    ED Discharge Orders     None          Desiderio Chew, DEVONNA 04/06/24 2234    Towana Ozell BROCKS, MD 04/07/24 [phone]

## 2024-04-06 NOTE — ED Notes (Addendum)
 [facility] arranged for transportation. [facility] given update on d/c.

## 2024-04-06 NOTE — ED Triage Notes (Signed)
 Patient BIB EMS from [facility] c/o weakness and urinary frequency x 2 days. Patient poor historian. Patient a/ox2. No report of fall. Patient denies SOB and chest pain.

## 2024-04-06 NOTE — Discharge Instructions (Addendum)
 Your complete blood cell counts and basic metabolic panel today were reassuring and at baseline.  You have a mild anemia that was unchanged and normal kidney function, blood glucose, and electrolytes.  Your urine did not show any signs of a urinary infection.   Please follow-up with your doctor regarding any continued symptoms, return to the emergency department for any worsening.

## 2024-04-06 NOTE — ED Notes (Signed)
 Pt is attempting to get a urine specimen
